# Patient Record
Sex: Female | Born: 1950 | Race: Black or African American | Hispanic: No | Marital: Married | Smoking: Never smoker
Health system: Southern US, Community
[De-identification: ages and names within clinical notes are randomized; demographics above are authoritative.]

## PROBLEM LIST (undated history)

## (undated) DIAGNOSIS — I10 Essential (primary) hypertension: Secondary | ICD-10-CM

## (undated) DIAGNOSIS — M109 Gout, unspecified: Secondary | ICD-10-CM

## (undated) DIAGNOSIS — Z8669 Personal history of other diseases of the nervous system and sense organs: Secondary | ICD-10-CM

## (undated) DIAGNOSIS — N950 Postmenopausal bleeding: Secondary | ICD-10-CM

## (undated) DIAGNOSIS — L280 Lichen simplex chronicus: Secondary | ICD-10-CM

## (undated) DIAGNOSIS — R7989 Other specified abnormal findings of blood chemistry: Secondary | ICD-10-CM

## (undated) DIAGNOSIS — N882 Stricture and stenosis of cervix uteri: Secondary | ICD-10-CM

## (undated) DIAGNOSIS — K219 Gastro-esophageal reflux disease without esophagitis: Secondary | ICD-10-CM

## (undated) DIAGNOSIS — M1990 Unspecified osteoarthritis, unspecified site: Secondary | ICD-10-CM

## (undated) DIAGNOSIS — J45909 Unspecified asthma, uncomplicated: Secondary | ICD-10-CM

## (undated) DIAGNOSIS — L680 Hirsutism: Secondary | ICD-10-CM

## (undated) DIAGNOSIS — E119 Type 2 diabetes mellitus without complications: Secondary | ICD-10-CM

## (undated) DIAGNOSIS — D259 Leiomyoma of uterus, unspecified: Secondary | ICD-10-CM

## (undated) DIAGNOSIS — Z973 Presence of spectacles and contact lenses: Secondary | ICD-10-CM

## (undated) DIAGNOSIS — M069 Rheumatoid arthritis, unspecified: Secondary | ICD-10-CM

## (undated) HISTORY — PX: COLONOSCOPY: SHX174

## (undated) HISTORY — PX: CARPAL TUNNEL RELEASE: SHX101

## (undated) HISTORY — DX: Essential (primary) hypertension: I10

---
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
## 1979-12-01 HISTORY — PX: TUBAL LIGATION: SHX77

## 1982-11-30 HISTORY — PX: CHOLECYSTECTOMY OPEN: SUR202

## 1983-12-01 HISTORY — PX: THROAT SURGERY: SHX803

## 1998-07-23 ENCOUNTER — Ambulatory Visit (HOSPITAL_COMMUNITY): Admission: RE | Admit: 1998-07-23 | Discharge: 1998-07-23 | Payer: Self-pay | Admitting: *Deleted

## 1998-11-19 ENCOUNTER — Ambulatory Visit (HOSPITAL_COMMUNITY): Admission: RE | Admit: 1998-11-19 | Discharge: 1998-11-19 | Payer: Self-pay

## 1999-03-05 ENCOUNTER — Ambulatory Visit (HOSPITAL_COMMUNITY): Admission: RE | Admit: 1999-03-05 | Discharge: 1999-03-05 | Payer: Self-pay | Admitting: *Deleted

## 1999-04-09 ENCOUNTER — Ambulatory Visit (HOSPITAL_COMMUNITY): Admission: RE | Admit: 1999-04-09 | Discharge: 1999-04-09 | Payer: Self-pay | Admitting: *Deleted

## 1999-05-20 ENCOUNTER — Ambulatory Visit (HOSPITAL_COMMUNITY): Admission: RE | Admit: 1999-05-20 | Discharge: 1999-05-20 | Payer: Self-pay | Admitting: *Deleted

## 1999-11-03 ENCOUNTER — Other Ambulatory Visit: Admission: RE | Admit: 1999-11-03 | Discharge: 1999-11-03 | Payer: Self-pay | Admitting: Obstetrics and Gynecology

## 2000-04-15 ENCOUNTER — Encounter: Payer: Self-pay | Admitting: *Deleted

## 2000-04-15 ENCOUNTER — Ambulatory Visit (HOSPITAL_COMMUNITY): Admission: RE | Admit: 2000-04-15 | Discharge: 2000-04-15 | Payer: Self-pay | Admitting: *Deleted

## 2001-07-27 ENCOUNTER — Ambulatory Visit (HOSPITAL_COMMUNITY): Admission: RE | Admit: 2001-07-27 | Discharge: 2001-07-27 | Payer: Self-pay | Admitting: *Deleted

## 2001-07-27 ENCOUNTER — Other Ambulatory Visit: Admission: RE | Admit: 2001-07-27 | Discharge: 2001-07-27 | Payer: Self-pay | Admitting: Obstetrics and Gynecology

## 2001-08-04 ENCOUNTER — Encounter: Admission: RE | Admit: 2001-08-04 | Discharge: 2001-08-04 | Payer: Self-pay | Admitting: *Deleted

## 2001-08-08 ENCOUNTER — Ambulatory Visit (HOSPITAL_COMMUNITY): Admission: RE | Admit: 2001-08-08 | Discharge: 2001-08-08 | Payer: Self-pay | Admitting: Obstetrics and Gynecology

## 2001-08-08 ENCOUNTER — Encounter: Payer: Self-pay | Admitting: Obstetrics and Gynecology

## 2004-01-15 ENCOUNTER — Other Ambulatory Visit: Admission: RE | Admit: 2004-01-15 | Discharge: 2004-01-15 | Payer: Self-pay | Admitting: Obstetrics and Gynecology

## 2004-01-15 ENCOUNTER — Ambulatory Visit (HOSPITAL_COMMUNITY): Admission: RE | Admit: 2004-01-15 | Discharge: 2004-01-15 | Payer: Self-pay | Admitting: Obstetrics and Gynecology

## 2005-07-05 ENCOUNTER — Emergency Department (HOSPITAL_COMMUNITY): Admission: EM | Admit: 2005-07-05 | Discharge: 2005-07-05 | Payer: Self-pay | Admitting: Emergency Medicine

## 2005-08-12 ENCOUNTER — Other Ambulatory Visit: Admission: RE | Admit: 2005-08-12 | Discharge: 2005-08-12 | Payer: Self-pay | Admitting: Obstetrics and Gynecology

## 2005-08-25 ENCOUNTER — Ambulatory Visit (HOSPITAL_COMMUNITY): Admission: RE | Admit: 2005-08-25 | Discharge: 2005-08-25 | Payer: Self-pay | Admitting: Obstetrics and Gynecology

## 2007-03-08 ENCOUNTER — Ambulatory Visit (HOSPITAL_COMMUNITY): Admission: RE | Admit: 2007-03-08 | Discharge: 2007-03-08 | Payer: Self-pay | Admitting: Internal Medicine

## 2007-04-12 ENCOUNTER — Encounter: Admission: RE | Admit: 2007-04-12 | Discharge: 2007-04-12 | Payer: Self-pay | Admitting: Neurosurgery

## 2007-12-05 ENCOUNTER — Encounter: Admission: RE | Admit: 2007-12-05 | Discharge: 2007-12-05 | Payer: Self-pay | Admitting: Internal Medicine

## 2008-12-18 ENCOUNTER — Emergency Department (HOSPITAL_COMMUNITY): Admission: EM | Admit: 2008-12-18 | Discharge: 2008-12-18 | Payer: Self-pay | Admitting: Emergency Medicine

## 2010-12-23 ENCOUNTER — Ambulatory Visit (HOSPITAL_COMMUNITY)
Admission: RE | Admit: 2010-12-23 | Discharge: 2010-12-23 | Payer: Self-pay | Source: Home / Self Care | Attending: Internal Medicine | Admitting: Internal Medicine

## 2011-01-19 ENCOUNTER — Emergency Department (INDEPENDENT_AMBULATORY_CARE_PROVIDER_SITE_OTHER): Payer: BC Managed Care – PPO

## 2011-01-19 ENCOUNTER — Emergency Department (HOSPITAL_BASED_OUTPATIENT_CLINIC_OR_DEPARTMENT_OTHER)
Admission: EM | Admit: 2011-01-19 | Discharge: 2011-01-19 | Disposition: A | Discharge status: Home / Self Care | Payer: BC Managed Care – PPO | Attending: Emergency Medicine | Admitting: Emergency Medicine

## 2011-01-19 DIAGNOSIS — Z79899 Other long term (current) drug therapy: Secondary | ICD-10-CM | POA: Insufficient documentation

## 2011-01-19 DIAGNOSIS — R079 Chest pain, unspecified: Secondary | ICD-10-CM

## 2011-01-19 DIAGNOSIS — R935 Abnormal findings on diagnostic imaging of other abdominal regions, including retroperitoneum: Secondary | ICD-10-CM

## 2011-01-19 DIAGNOSIS — M549 Dorsalgia, unspecified: Secondary | ICD-10-CM | POA: Insufficient documentation

## 2011-01-19 DIAGNOSIS — R599 Enlarged lymph nodes, unspecified: Secondary | ICD-10-CM | POA: Insufficient documentation

## 2011-01-19 DIAGNOSIS — I10 Essential (primary) hypertension: Secondary | ICD-10-CM | POA: Insufficient documentation

## 2011-01-19 DIAGNOSIS — R109 Unspecified abdominal pain: Secondary | ICD-10-CM

## 2011-01-19 DIAGNOSIS — E119 Type 2 diabetes mellitus without complications: Secondary | ICD-10-CM | POA: Insufficient documentation

## 2011-01-19 LAB — POCT CARDIAC MARKERS
CKMB, poc: 1.5 ng/mL (ref 1.0–8.0)
Myoglobin, poc: 89.6 ng/mL (ref 12–200)
Troponin i, poc: <0.05 ng/mL (ref 0.00–0.09)

## 2011-01-19 LAB — COMPREHENSIVE METABOLIC PANEL
AST: 23 U/L (ref 0–37)
Albumin: 4.6 g/dL (ref 3.5–5.2)
BUN: 13 mg/dL (ref 6–23)
Calcium: 10.6 mg/dL — ABNORMAL HIGH (ref 8.4–10.5)
Creatinine, Ser: 0.9 mg/dL (ref 0.4–1.2)
GFR calc Af Amer: >60 mL/min (ref >60)
GFR calc non Af Amer: >60 mL/min (ref >60)
Glucose, Bld: 64 mg/dL — ABNORMAL LOW (ref 70–99)
Potassium: 3.5 mEq/L (ref 3.5–5.1)
Total Bilirubin: 0.7 mg/dL (ref 0.3–1.2)

## 2011-01-19 LAB — LIPASE, BLOOD: Lipase: 63 U/L (ref 23–300)

## 2011-01-19 MED ORDER — IOHEXOL 300 MG/ML  SOLN: 100.0000 mL | Freq: Once | INTRAMUSCULAR | Status: DC | PRN

## 2011-01-20 LAB — POCT CARDIAC MARKERS
CKMB, poc: 1.9 ng/mL (ref 1.0–8.0)
Troponin i, poc: <0.05 ng/mL (ref 0.00–0.09)

## 2011-02-20 LAB — DIFFERENTIAL
Lymphocytes Relative: 35 % (ref 12–46)
Lymphs Abs: 5.3 10*3/uL — ABNORMAL HIGH (ref 0.7–4.0)
Monocytes Relative: 10 % (ref 3–12)
Neutro Abs: 7.9 10*3/uL — ABNORMAL HIGH (ref 1.7–7.7)

## 2011-02-20 LAB — CBC
HCT: 38.4 % (ref 36.0–46.0)
Hemoglobin: 12.6 g/dL (ref 12.0–15.0)
MCH: 29 pg (ref 26.0–34.0)
MCHC: 32.8 g/dL (ref 30.0–36.0)
MCV: 88.3 fL (ref 78.0–100.0)
RBC: 4.35 MIL/uL (ref 3.87–5.11)
RDW: 13.5 % (ref 11.5–15.5)
WBC: 15.2 10*3/uL — ABNORMAL HIGH (ref 4.0–10.5)

## 2011-03-16 LAB — GLUCOSE, CAPILLARY: Glucose-Capillary: 112 mg/dL — ABNORMAL HIGH (ref 70–99)

## 2012-01-15 ENCOUNTER — Other Ambulatory Visit (HOSPITAL_COMMUNITY): Payer: Self-pay | Admitting: Internal Medicine

## 2012-01-15 DIAGNOSIS — Z1231 Encounter for screening mammogram for malignant neoplasm of breast: Secondary | ICD-10-CM

## 2012-02-11 ENCOUNTER — Ambulatory Visit (HOSPITAL_COMMUNITY)
Admission: RE | Admit: 2012-02-11 | Discharge: 2012-02-11 | Disposition: A | Discharge status: Home / Self Care | Payer: BC Managed Care – PPO | Source: Ambulatory Visit | Attending: Internal Medicine | Admitting: Internal Medicine

## 2012-02-11 DIAGNOSIS — Z1231 Encounter for screening mammogram for malignant neoplasm of breast: Secondary | ICD-10-CM

## 2012-03-15 ENCOUNTER — Ambulatory Visit (INDEPENDENT_AMBULATORY_CARE_PROVIDER_SITE_OTHER): Payer: BC Managed Care – PPO | Admitting: Obstetrics and Gynecology

## 2012-03-15 ENCOUNTER — Encounter: Payer: Self-pay | Admitting: Obstetrics and Gynecology

## 2012-03-15 VITALS — BP 124/76 | HR 66 | Ht 64.0 in | Wt 210.0 lb

## 2012-03-15 DIAGNOSIS — E119 Type 2 diabetes mellitus without complications: Secondary | ICD-10-CM

## 2012-03-15 DIAGNOSIS — I10 Essential (primary) hypertension: Secondary | ICD-10-CM | POA: Insufficient documentation

## 2012-03-15 DIAGNOSIS — Z780 Asymptomatic menopausal state: Secondary | ICD-10-CM

## 2012-03-15 DIAGNOSIS — E669 Obesity, unspecified: Secondary | ICD-10-CM

## 2012-03-15 DIAGNOSIS — I152 Hypertension secondary to endocrine disorders: Secondary | ICD-10-CM | POA: Insufficient documentation

## 2012-03-15 DIAGNOSIS — N760 Acute vaginitis: Secondary | ICD-10-CM

## 2012-03-15 DIAGNOSIS — N951 Menopausal and female climacteric states: Secondary | ICD-10-CM

## 2012-03-15 DIAGNOSIS — B373 Candidiasis of vulva and vagina: Secondary | ICD-10-CM

## 2012-03-15 DIAGNOSIS — E1169 Type 2 diabetes mellitus with other specified complication: Secondary | ICD-10-CM | POA: Insufficient documentation

## 2012-03-15 DIAGNOSIS — E1159 Type 2 diabetes mellitus with other circulatory complications: Secondary | ICD-10-CM | POA: Insufficient documentation

## 2012-03-15 DIAGNOSIS — Z124 Encounter for screening for malignant neoplasm of cervix: Secondary | ICD-10-CM

## 2012-03-15 DIAGNOSIS — L280 Lichen simplex chronicus: Secondary | ICD-10-CM

## 2012-03-15 MED ORDER — TERCONAZOLE 0.4 % VA CREA: 1.0000 | TOPICAL_CREAM | Freq: Every day | VAGINAL | Status: AC

## 2012-03-15 MED ORDER — CLOBETASOL PROPIONATE 0.05 % EX OINT: TOPICAL_OINTMENT | Freq: Two times a day (BID) | CUTANEOUS | Status: AC

## 2012-03-15 NOTE — Progress Notes (Signed)
Pt c/o vulvar rash and itching that flares occasionally with discharge.   Subjective:    Nancy Wagner is a 61 y.o. female No obstetric history on file. who presents for annual exam. She complains of vulvar itching with discharge. She has a biopsy diagnosis of lichen simplex chronicus and has used topical steroids in the past.   The following portions of the patient's history were reviewed and updated as appropriate: allergies, current medications, past family history, past medical history, past social history, past surgical history and problem list.  Review of Systems Pertinent items are noted in HPI. Gastrointestinal:No change in bowel habits, no abdominal pain, no rectal bleeding Genitourinary:negative for dysuria, frequency, hematuria, nocturia and urinary incontinence    Objective:     BP 124/76  Pulse 66  Ht 5\' 4"  (1.626 m)  Wt 210 lb (95.255 kg)  BMI 36.05 kg/m2  Weight:  Wt Readings from Last 1 Encounters:  03/15/12 210 lb (95.255 kg)     BMI: Body mass index is 36.05 kg/(m^2). General Appearance: Alert, appropriate appearance for age. No acute distress HEENT: Grossly normal Neck / Thyroid: Supple, no masses, nodes or enlargement Lungs: clear to auscultation bilaterally Back: No CVA tenderness Breast Exam: No masses or nodes.No dimpling, nipple retraction or discharge. Cardiovascular: Regular rate and rhythm. S1, S2, no murmur Gastrointestinal: Soft, non-tender, no masses or organomegaly Pelvic Exam: External genitalia: excoriated with ulceration Vaginal: normal mucosa without prolapse or lesions Cervix: normal appearance Adnexa: non palpable Uterus: upper limit nl size Exam limited by body habitus Rectovaginal: normal rectal, no masses Lymphatic Exam: Non-palpable nodes in neck, clavicular, axillary, or inguinal regions Skin: no rash or abnormalities Neurologic: Normal gait and speech, no tremor  Psychiatric: Alert and oriented, appropriate affect.     Urinalysis:Not done      Assessment:    Monilia  Lichen simplex chronicus Type II diabetes hypertension   Plan:    All questions answered. Discussed healthy lifestyle modifications. Pap smear. Wet prep. Terazol  Temovate Temovate to external vulva  Follow-up:  in 6 week(s)

## 2012-03-16 ENCOUNTER — Other Ambulatory Visit: Payer: Self-pay | Admitting: Obstetrics and Gynecology

## 2012-03-16 ENCOUNTER — Telehealth: Payer: Self-pay | Admitting: Obstetrics and Gynecology

## 2012-03-16 LAB — PAP IG W/ RFLX HPV ASCU

## 2012-03-16 NOTE — Telephone Encounter (Signed)
PC TO PHARM PER TELEPHONE NOTE. RX FOR TERAZOL AND TEMOVATE CALLED TO PHARM. SPOKE WITH DAVID(PHARM). PT AWARE.

## 2012-03-16 NOTE — Telephone Encounter (Signed)
@  10:29A TC TO PHARM PER TELEPHONE CALL. TOLD BY PHARM TO DISREGARD REQUEST. CALL ENDED.

## 2012-03-16 NOTE — Telephone Encounter (Signed)
Routed to chandra 

## 2012-04-26 ENCOUNTER — Encounter: Payer: Self-pay | Admitting: Obstetrics and Gynecology

## 2012-04-26 ENCOUNTER — Ambulatory Visit (INDEPENDENT_AMBULATORY_CARE_PROVIDER_SITE_OTHER): Payer: BC Managed Care – PPO | Admitting: Obstetrics and Gynecology

## 2012-04-26 ENCOUNTER — Ambulatory Visit: Payer: BC Managed Care – PPO

## 2012-04-26 VITALS — BP 120/72 | Ht 64.0 in | Wt 212.0 lb

## 2012-04-26 DIAGNOSIS — Z780 Asymptomatic menopausal state: Secondary | ICD-10-CM

## 2012-04-26 DIAGNOSIS — N951 Menopausal and female climacteric states: Secondary | ICD-10-CM | POA: Insufficient documentation

## 2012-04-26 NOTE — Progress Notes (Signed)
Pt has no complaints today JM Presents for bone density which is normal  Rec:  Increase exercise  Continue calcium once daily

## 2013-08-21 ENCOUNTER — Other Ambulatory Visit: Payer: BC Managed Care – PPO | Admitting: Internal Medicine

## 2013-08-21 ENCOUNTER — Other Ambulatory Visit: Payer: Self-pay | Admitting: Internal Medicine

## 2013-08-21 DIAGNOSIS — Z0000 Encounter for general adult medical examination without abnormal findings: Secondary | ICD-10-CM

## 2013-08-21 DIAGNOSIS — Z13220 Encounter for screening for lipoid disorders: Secondary | ICD-10-CM

## 2013-08-21 DIAGNOSIS — Z130 Encounter for screening for diseases of the blood and blood-forming organs and certain disorders involving the immune mechanism: Secondary | ICD-10-CM

## 2013-08-21 DIAGNOSIS — Z1329 Encounter for screening for other suspected endocrine disorder: Secondary | ICD-10-CM

## 2013-08-21 LAB — COMPREHENSIVE METABOLIC PANEL
ALT: 13 U/L (ref 0–35)
AST: 13 U/L (ref 0–37)
Alkaline Phosphatase: 45 U/L (ref 39–117)
CO2: 30 mEq/L (ref 19–32)
Calcium: 9.6 mg/dL (ref 8.4–10.5)
Creat: 0.91 mg/dL (ref 0.50–1.10)
Potassium: 4.1 mEq/L (ref 3.5–5.3)
Sodium: 139 mEq/L (ref 135–145)
Total Bilirubin: 0.4 mg/dL (ref 0.3–1.2)

## 2013-08-21 LAB — CBC WITH DIFFERENTIAL/PLATELET
Eosinophils Absolute: 0.3 10*3/uL (ref 0.0–0.7)
HCT: 37 % (ref 36.0–46.0)
Hemoglobin: 12.2 g/dL (ref 12.0–15.0)
Lymphs Abs: 2.9 10*3/uL (ref 0.7–4.0)
MCH: 29.3 pg (ref 26.0–34.0)
Monocytes Absolute: 0.6 10*3/uL (ref 0.1–1.0)
Monocytes Relative: 8 % (ref 3–12)
Neutro Abs: 3.3 10*3/uL (ref 1.7–7.7)
Neutrophils Relative %: 47 % (ref 43–77)
RBC: 4.17 MIL/uL (ref 3.87–5.11)

## 2013-08-21 LAB — TSH: TSH: 1.572 u[IU]/mL (ref 0.350–4.500)

## 2013-08-21 LAB — LIPID PANEL
HDL: 58 mg/dL (ref >39)
LDL Cholesterol: 145 mg/dL — ABNORMAL HIGH (ref 0–99)
Total CHOL/HDL Ratio: 3.9 Ratio
VLDL: 24 mg/dL (ref 0–40)

## 2013-08-22 LAB — VITAMIN D 25 HYDROXY (VIT D DEFICIENCY, FRACTURES): Vit D, 25-Hydroxy: 20 ng/mL — ABNORMAL LOW (ref 30–89)

## 2013-08-24 ENCOUNTER — Encounter: Payer: Self-pay | Admitting: Internal Medicine

## 2013-08-24 ENCOUNTER — Ambulatory Visit (INDEPENDENT_AMBULATORY_CARE_PROVIDER_SITE_OTHER): Payer: BC Managed Care – PPO | Admitting: Internal Medicine

## 2013-08-24 VITALS — BP 150/78 | HR 68 | Temp 97.7°F | Ht 64.0 in | Wt 218.0 lb

## 2013-08-24 DIAGNOSIS — M1712 Unilateral primary osteoarthritis, left knee: Secondary | ICD-10-CM | POA: Insufficient documentation

## 2013-08-24 DIAGNOSIS — Z8639 Personal history of other endocrine, nutritional and metabolic disease: Secondary | ICD-10-CM

## 2013-08-24 DIAGNOSIS — E782 Mixed hyperlipidemia: Secondary | ICD-10-CM

## 2013-08-24 DIAGNOSIS — Z0000 Encounter for general adult medical examination without abnormal findings: Secondary | ICD-10-CM

## 2013-08-24 DIAGNOSIS — E669 Obesity, unspecified: Secondary | ICD-10-CM | POA: Insufficient documentation

## 2013-08-24 DIAGNOSIS — E559 Vitamin D deficiency, unspecified: Secondary | ICD-10-CM

## 2013-08-24 DIAGNOSIS — E119 Type 2 diabetes mellitus without complications: Secondary | ICD-10-CM

## 2013-08-24 DIAGNOSIS — M1710 Unilateral primary osteoarthritis, unspecified knee: Secondary | ICD-10-CM

## 2013-08-24 DIAGNOSIS — E8881 Metabolic syndrome: Secondary | ICD-10-CM

## 2013-08-24 DIAGNOSIS — I10 Essential (primary) hypertension: Secondary | ICD-10-CM

## 2013-08-24 LAB — POCT URINALYSIS DIPSTICK
Bilirubin, UA: NEGATIVE
Ketones, UA: NEGATIVE
Protein, UA: NEGATIVE
Spec Grav, UA: 1.025
pH, UA: 6

## 2013-08-24 NOTE — Patient Instructions (Addendum)
Taking vitamin D supplement weekly as directed then take 2000 units vitamin D 3 daily. Start Zocor 10 mg daily. Continue antihypertensive medication. Continue diabetic medication. Please take diet and exercise seriously. Return in 3 months.

## 2013-08-24 NOTE — Progress Notes (Signed)
Subjective:    Patient ID: Nancy Wagner, female    DOB: [birth year], 62 y.o.   MRN: [id number]  HPI 62 year old Black female presents to office for the first time today referred by Aliene Altes. She and Tomma Lightning worked together. 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  Patient has a history of type 2 diabetes mellitus, hypertension, obesity.  She takes repaglinide metformin daily, Tenormin 100 mg daily, Catapres 0.1 mg twice daily,  Micardis- HCTZ 80-25 daily.  After some research, we were able to find that she had colonoscopy by Dr. Virginia Rochester on  07/23/1998. This was for heme positive stools with tenesmus. Patient was found to have internal hemorrhoids but no polyps or masses.  Currently not on lipid-lowering medication.  Followed by Dr. Burgess Estelle, ophthalmologist for diabetic eye exam and last saw him February 2014. At one point she had an infection in her right eye and was seen at [facility] by Dr. Everlena Cooper. She was on prednisone for some period of time. Says she does not know what type of infection she had but says it was not shingles. We do not have those records.  Patient had pneumonia in 1977. Had throat surgery by Dr. Haroldine Laws in 1985. 2 C-sections 1975 and 1978. Had tubal ligation 1981. Had cholecystectomy in the early 1980s.  She is intolerant of codeine it causes rash and hives.  Patient does not smoke or consume alcohol. She has 2 adult children a son and a daughter in good health. She is married.  Family history: Father age 10 and mother age 70 in good health. 4 brothers living in good health. 4 sisters 3 of whom are in good health but one is living at age 19 with history of heart attack and other health issues.  Patient says she goes to the gym twice a week.  In 2012 Dr. Lajean Silvius injected her left knee with steroid for osteoarthritis  It seems she had labile hypertension when she was previously seen by Dr. Nicholos Johns.  I cannot find  date of recent tetanus immunization in his old records. She cannot tell me date of last tetanus immunization either.  All records indicate she used to take Zocor but that was discontinued.    Review of Systems  Constitutional: Negative.   HENT: Negative.   Eyes:       History of right eye problems treated by Dr. Everlena Cooper  Respiratory: Negative.   Cardiovascular: Negative.   Gastrointestinal: Negative.   Endocrine:       History of diabetes mellitus controlled with medication  Allergic/Immunologic: Negative.   Neurological: Negative.   Hematological: Negative.   Psychiatric/Behavioral: Negative.        Objective:   Physical Exam  Vitals reviewed. Constitutional: She is oriented to person, place, and time. She appears well-developed and well-nourished. No distress.  HENT:  Head: Normocephalic and atraumatic.  Right Ear: External ear normal.  Left Ear: External ear normal.  Mouth/Throat: Oropharynx is clear and moist. No oropharyngeal exudate.  Eyes: Conjunctivae and EOM are normal. Pupils are equal, round, and reactive to light. Right eye exhibits no discharge. Left eye exhibits no discharge. No scleral icterus.  Neck: Neck supple. No JVD present. No thyromegaly present.  Cardiovascular: Normal rate, regular rhythm, normal heart sounds and intact distal pulses.   No murmur heard. Pulmonary/Chest: Effort normal and breath sounds normal. No respiratory distress. She has no wheezes. She has no rales. She exhibits no tenderness.  Breasts normal female without masses  Abdominal: Soft. Bowel sounds are normal. She exhibits no distension and no mass. There is no tenderness. There is no rebound and no guarding.  Genitourinary:  Deferred to Dr. Pennie Rushing  Musculoskeletal: She exhibits no edema.  Neurological: She is alert and oriented to person, place, and time. She has normal reflexes. She displays normal reflexes. No cranial nerve deficit. Coordination normal.  Skin: Skin is warm and  dry. No rash noted. She is not diaphoretic.  Psychiatric: She has a normal mood and affect. Her behavior is normal. Judgment and thought content normal.          Assessment & Plan:  Obesity-spoke with patient about weight loss, diet ,and exercise  Type 2 diabetes mellitus-controlled with diet  Hypertension-treated with myocarditis HCTZ and Tenormin  Vitamin D deficiency  Osteoarthritis left knee-has had steroid injection by Dr. Dierdre Forth 2012  Plan: Start Zocor 10 mg daily, needs to take vitamin D supplementation 50,000 units weekly for 12 weeks then2000 units daily, continue same medications for hypertension and diabetes. Patient should return in 3 months for office visit, lipid panel ,liver functions, and hemoglobin A 1C. Needs to be encouraged diet exercise and lose weight. Patient refuses all immunizations.

## 2013-09-05 ENCOUNTER — Other Ambulatory Visit: Payer: Self-pay | Admitting: Internal Medicine

## 2013-09-05 DIAGNOSIS — Z1231 Encounter for screening mammogram for malignant neoplasm of breast: Secondary | ICD-10-CM

## 2013-09-15 ENCOUNTER — Ambulatory Visit (HOSPITAL_COMMUNITY)
Admission: RE | Admit: 2013-09-15 | Discharge: 2013-09-15 | Disposition: A | Discharge status: Home / Self Care | Payer: BC Managed Care – PPO | Source: Ambulatory Visit | Attending: Internal Medicine | Admitting: Internal Medicine

## 2013-09-15 DIAGNOSIS — Z1231 Encounter for screening mammogram for malignant neoplasm of breast: Secondary | ICD-10-CM | POA: Insufficient documentation

## 2013-10-15 ENCOUNTER — Telehealth: Payer: Self-pay | Admitting: Internal Medicine

## 2013-10-15 MED ORDER — TELMISARTAN-HCTZ 80-25 MG PO TABS: 1.0000 | ORAL_TABLET | Freq: Every day | ORAL | Status: DC

## 2013-10-15 NOTE — Telephone Encounter (Signed)
Refill Micardis 80/25 #30 with 6 refills to [facility] fax [phone]

## 2013-12-04 ENCOUNTER — Other Ambulatory Visit: Payer: BC Managed Care – PPO | Admitting: Internal Medicine

## 2013-12-04 DIAGNOSIS — E559 Vitamin D deficiency, unspecified: Secondary | ICD-10-CM

## 2013-12-04 DIAGNOSIS — E119 Type 2 diabetes mellitus without complications: Secondary | ICD-10-CM

## 2013-12-04 DIAGNOSIS — E785 Hyperlipidemia, unspecified: Secondary | ICD-10-CM

## 2013-12-04 DIAGNOSIS — Z79899 Other long term (current) drug therapy: Secondary | ICD-10-CM

## 2013-12-04 LAB — LIPID PANEL
Cholesterol: 236 mg/dL — ABNORMAL HIGH (ref 0–200)
HDL: 61 mg/dL (ref >39)
LDL Cholesterol: 147 mg/dL — ABNORMAL HIGH (ref 0–99)
TRIGLYCERIDES: 142 mg/dL (ref <150)
Total CHOL/HDL Ratio: 3.9 Ratio
VLDL: 28 mg/dL (ref 0–40)

## 2013-12-04 LAB — HEPATIC FUNCTION PANEL
ALT: 13 U/L (ref 0–35)
AST: 13 U/L (ref 0–37)
Albumin: 3.8 g/dL (ref 3.5–5.2)
Alkaline Phosphatase: 47 U/L (ref 39–117)
BILIRUBIN DIRECT: 0.1 mg/dL (ref 0.0–0.3)
Indirect Bilirubin: 0.2 mg/dL (ref 0.0–0.9)
Total Bilirubin: 0.3 mg/dL (ref 0.3–1.2)
Total Protein: 8.2 g/dL (ref 6.0–8.3)

## 2013-12-04 LAB — HEMOGLOBIN A1C
Hgb A1c MFr Bld: 7.2 % — ABNORMAL HIGH (ref <5.7)
Mean Plasma Glucose: 160 mg/dL — ABNORMAL HIGH (ref <117)

## 2013-12-05 ENCOUNTER — Encounter: Payer: Self-pay | Admitting: Internal Medicine

## 2013-12-05 ENCOUNTER — Ambulatory Visit (INDEPENDENT_AMBULATORY_CARE_PROVIDER_SITE_OTHER): Payer: BC Managed Care – PPO | Admitting: Internal Medicine

## 2013-12-05 VITALS — BP 144/78 | HR 78 | Temp 98.2°F | Wt 210.0 lb

## 2013-12-05 DIAGNOSIS — E8881 Metabolic syndrome: Secondary | ICD-10-CM

## 2013-12-05 DIAGNOSIS — E785 Hyperlipidemia, unspecified: Secondary | ICD-10-CM

## 2013-12-05 DIAGNOSIS — I10 Essential (primary) hypertension: Secondary | ICD-10-CM

## 2013-12-05 DIAGNOSIS — E119 Type 2 diabetes mellitus without complications: Secondary | ICD-10-CM

## 2013-12-05 DIAGNOSIS — E669 Obesity, unspecified: Secondary | ICD-10-CM

## 2013-12-05 LAB — VITAMIN D 25 HYDROXY (VIT D DEFICIENCY, FRACTURES): Vit D, 25-Hydroxy: 24 ng/mL — ABNORMAL LOW (ref 30–89)

## 2013-12-05 NOTE — Progress Notes (Signed)
   Subjective:    Patient ID: Nancy Wagner, female    DOB: [birth year], 63 y.o.   MRN: [id number]  HPI  In today for followup on diabetes mellitus, hypertension, hyperlipidemia, metabolic syndrome, vitamin D deficiency. She is not compliant with Zocor. She has been going to gym on a regular basis and has lost 8 pounds since initial visit which is good. Hemoglobin A1c as the same at 7.2%. Blood pressure is stable. Vitamin D has not changed very much despite taking 50,000 units weekly for 12 weeks followed by thousand units daily. She should increase to 2000 units daily vitamin D 3. I'm disappointed she has not been compliant with Zocor. Has not taken it for 2 weeks and lipid panel reflects that. Of course liver functions are normal.    Review of Systems     Objective:   Physical Exam Chest clear. Cardiac exam regular rate and rhythm normal S1-S2. Extremities without edema       Assessment & Plan:  For followup of multiple medical issues: Compliance is an issue  Hypertension  Adult onset diabetes mellitus  Hyperlipidemia  Vitamin D deficiency    It is good that she has begun exercising regularly at the gym. She is on a pill weight loss. Reviewed with her once again 1800-calorie diet. She needs to purchase a new home glucose monitor. Says Accu-Cheks are running no higher than 111 at home but according to A1c glucose is averaging 160. She only checks it in the mornings. Not checking it before supper. I will her to check Accu-Cheks before breakfast and before supper and call me with some readings in the next 2 weeks. We need to determine if she needs medication or not. She does take Zocor 10 mg daily on a regular basis. I would see her again in 3 months for followup on hyperlipidemia and diabetic management. She will have lipid panel liver functions hemoglobin A1c in vitamin D level. She should take 2000 units vitamin D 3 daily

## 2013-12-05 NOTE — Patient Instructions (Signed)
Continue diet and exercise. Follow 1800-calorie ADA diet. Return in 3 months. Take 2000 units vitamin D 3 daily. Takes Zocor on a daily basis. Obtain Accu-Cheks before breakfast and before supper and call in 2 weeks with readings. Otherwise return in 3 months

## 2014-02-27 ENCOUNTER — Other Ambulatory Visit: Payer: BC Managed Care – PPO | Admitting: Internal Medicine

## 2014-03-01 ENCOUNTER — Ambulatory Visit: Payer: BC Managed Care – PPO | Admitting: Internal Medicine

## 2014-03-06 ENCOUNTER — Other Ambulatory Visit: Payer: BC Managed Care – PPO | Admitting: Internal Medicine

## 2014-03-06 DIAGNOSIS — Z79899 Other long term (current) drug therapy: Secondary | ICD-10-CM

## 2014-03-06 DIAGNOSIS — E559 Vitamin D deficiency, unspecified: Secondary | ICD-10-CM

## 2014-03-06 DIAGNOSIS — E119 Type 2 diabetes mellitus without complications: Secondary | ICD-10-CM

## 2014-03-06 DIAGNOSIS — E785 Hyperlipidemia, unspecified: Secondary | ICD-10-CM

## 2014-03-06 LAB — HEPATIC FUNCTION PANEL
ALT: 15 U/L (ref 0–35)
AST: 13 U/L (ref 0–37)
Albumin: 3.5 g/dL (ref 3.5–5.2)
Alkaline Phosphatase: 52 U/L (ref 39–117)
BILIRUBIN DIRECT: 0.1 mg/dL (ref 0.0–0.3)
BILIRUBIN INDIRECT: 0.4 mg/dL (ref 0.2–1.2)
TOTAL PROTEIN: 7.9 g/dL (ref 6.0–8.3)
Total Bilirubin: 0.5 mg/dL (ref 0.2–1.2)

## 2014-03-06 LAB — HEMOGLOBIN A1C
HEMOGLOBIN A1C: 7 % — AB (ref <5.7)
Mean Plasma Glucose: 154 mg/dL — ABNORMAL HIGH (ref <117)

## 2014-03-06 LAB — LIPID PANEL
Cholesterol: 192 mg/dL (ref 0–200)
HDL: 44 mg/dL (ref >39)
LDL CALC: 134 mg/dL — AB (ref 0–99)
Total CHOL/HDL Ratio: 4.4 Ratio
Triglycerides: 71 mg/dL (ref <150)
VLDL: 14 mg/dL (ref 0–40)

## 2014-03-07 ENCOUNTER — Other Ambulatory Visit: Payer: Self-pay

## 2014-03-07 LAB — VITAMIN D 25 HYDROXY (VIT D DEFICIENCY, FRACTURES): Vit D, 25-Hydroxy: 37 ng/mL (ref 30–89)

## 2014-03-07 MED ORDER — REPAGLINIDE-METFORMIN HCL 2-500 MG PO TABS: 1.0000 | ORAL_TABLET | Freq: Three times a day (TID) | ORAL | Status: DC

## 2014-03-08 ENCOUNTER — Ambulatory Visit (INDEPENDENT_AMBULATORY_CARE_PROVIDER_SITE_OTHER): Payer: BC Managed Care – PPO | Admitting: Internal Medicine

## 2014-03-08 ENCOUNTER — Encounter: Payer: Self-pay | Admitting: Internal Medicine

## 2014-03-08 VITALS — BP 154/80 | HR 76 | Temp 98.1°F | Ht 63.0 in | Wt 205.0 lb

## 2014-03-08 DIAGNOSIS — J029 Acute pharyngitis, unspecified: Secondary | ICD-10-CM

## 2014-03-08 DIAGNOSIS — B9789 Other viral agents as the cause of diseases classified elsewhere: Secondary | ICD-10-CM

## 2014-03-08 DIAGNOSIS — E119 Type 2 diabetes mellitus without complications: Secondary | ICD-10-CM

## 2014-03-08 DIAGNOSIS — B349 Viral infection, unspecified: Secondary | ICD-10-CM

## 2014-03-08 DIAGNOSIS — I10 Essential (primary) hypertension: Secondary | ICD-10-CM

## 2014-03-08 DIAGNOSIS — E785 Hyperlipidemia, unspecified: Secondary | ICD-10-CM

## 2014-03-08 LAB — POCT RAPID STREP A (OFFICE): RAPID STREP A SCREEN: NEGATIVE

## 2014-03-08 MED ORDER — BENZONATATE 200 MG PO CAPS: 200.0000 mg | ORAL_CAPSULE | Freq: Three times a day (TID) | ORAL | Status: DC | PRN

## 2014-03-08 MED ORDER — LEVOFLOXACIN 500 MG PO TABS: 500.0000 mg | ORAL_TABLET | Freq: Every day | ORAL | Status: DC

## 2014-03-08 MED ORDER — SIMVASTATIN 20 MG PO TABS: 20.0000 mg | ORAL_TABLET | Freq: Once | ORAL | Status: DC

## 2014-03-08 NOTE — Patient Instructions (Signed)
Take Levaquin 500 milligrams daily for 7 days. Increase Zocor to 20 mg daily. Take blood pressure medication always before coming to the office. Continue same diabetic medication. Return in 4 months.

## 2014-03-19 ENCOUNTER — Other Ambulatory Visit: Payer: Self-pay | Admitting: Internal Medicine

## 2014-03-31 ENCOUNTER — Encounter: Payer: Self-pay | Admitting: Internal Medicine

## 2014-03-31 NOTE — Progress Notes (Signed)
   Subjective:    Patient ID: Caroline More, female    DOB: [birth year], 63 y.o.   MRN: [id number]  HPI In today to followup on hypertension and type 2 diabetes mellitus. History also of hyperlipidemia and obesity with metabolic syndrome. Patient has not had blood pressure medication this morning. 4 day history of URI symptoms. She is on Zocor and Tenormin as well as myocarditis HCTZ. She is on Catapres. For diabetes she is on Prandimet. At last visit in January she was noncompliant with Zocor. Has lost 4-1/2 pounds since January. Total cholesterol has decreased from 236 to 192. LDL cholesterol has decreased from 147 to 134. Has had malaise and fatigue due to respiratory infection with some cough and congestion. Slight sore throat.    Review of Systems     Objective:   Physical Exam neck supple without JVD thyromegaly adenopathy. Chest clear. Cardiac exam regular rate and rhythm. Extremities without edema.pharynx slightly injected. Rapid strep screen negative         Assessment & Plan:   Acute URI  Hypertension-did not take blood pressure medication before coming to office. Reminded to do so  Controlled type 2 diabetes mellitus-hemoglobin A1c has improved from 7.2% to 7%  Hyperlipidemia-better being compliant with Zocor but I would like to see LDL less than 100. Increase Zocor to 20 mg daily and return in 4 months  Plan: Return in 4 months for office visit blood pressure check lipid panel liver functions on increased dose of Zocor.

## 2014-04-24 ENCOUNTER — Encounter (HOSPITAL_COMMUNITY): Payer: Self-pay | Admitting: Emergency Medicine

## 2014-04-24 ENCOUNTER — Emergency Department (HOSPITAL_COMMUNITY)
Admission: EM | Admit: 2014-04-24 | Discharge: 2014-04-24 | Disposition: A | Discharge status: Home / Self Care | Payer: BC Managed Care – PPO | Source: Home / Self Care

## 2014-04-24 DIAGNOSIS — S335XXA Sprain of ligaments of lumbar spine, initial encounter: Secondary | ICD-10-CM

## 2014-04-24 DIAGNOSIS — IMO0002 Reserved for concepts with insufficient information to code with codable children: Secondary | ICD-10-CM

## 2014-04-24 DIAGNOSIS — S39012A Strain of muscle, fascia and tendon of lower back, initial encounter: Secondary | ICD-10-CM

## 2014-04-24 DIAGNOSIS — X58XXXA Exposure to other specified factors, initial encounter: Secondary | ICD-10-CM

## 2014-04-24 DIAGNOSIS — M25559 Pain in unspecified hip: Secondary | ICD-10-CM

## 2014-04-24 DIAGNOSIS — S76919A Strain of unspecified muscles, fascia and tendons at thigh level, unspecified thigh, initial encounter: Secondary | ICD-10-CM

## 2014-04-24 DIAGNOSIS — M25551 Pain in right hip: Secondary | ICD-10-CM

## 2014-04-24 DIAGNOSIS — S76011A Strain of muscle, fascia and tendon of right hip, initial encounter: Secondary | ICD-10-CM

## 2014-04-24 MED ORDER — MELOXICAM 15 MG PO TABS: 15.0000 mg | ORAL_TABLET | Freq: Every day | ORAL | Status: DC

## 2014-04-24 MED ORDER — KETOROLAC TROMETHAMINE 60 MG/2ML IM SOLN
60.0000 mg | Freq: Once | INTRAMUSCULAR | Status: AC
Start: 2014-04-24 — End: 2014-04-24
  Administered 2014-04-24: 60 mg via INTRAMUSCULAR

## 2014-04-24 MED ORDER — KETOROLAC TROMETHAMINE 60 MG/2ML IM SOLN
INTRAMUSCULAR | Status: AC
  Filled 2014-04-24: qty 2

## 2014-04-24 MED ORDER — HYDROCODONE-ACETAMINOPHEN 5-325 MG PO TABS: 1.0000 | ORAL_TABLET | ORAL | Status: DC | PRN

## 2014-04-24 NOTE — ED Notes (Signed)
Hip and leg pain, no known injury.  Patient has been trying to exercise and doing different exercises, unsure if related

## 2014-04-24 NOTE — Discharge Instructions (Signed)
Back Pain, Adult Low back pain is very common. About 1 in 5 people have back pain.The cause of low back pain is rarely dangerous. The pain often gets better over time.About half of people with a sudden onset of back pain feel better in just 2 weeks. About 8 in 10 people feel better by 6 weeks.  CAUSES Some common causes of back pain include:  Strain of the muscles or ligaments supporting the spine.  Wear and tear (degeneration) of the spinal discs.  Arthritis.  Direct injury to the back. DIAGNOSIS Most of the time, the direct cause of low back pain is not known.However, back pain can be treated effectively even when the exact cause of the pain is unknown.Answering your caregiver's questions about your overall health and symptoms is one of the most accurate ways to make sure the cause of your pain is not dangerous. If your caregiver needs more information, he or she may order lab work or imaging tests (X-rays or MRIs).However, even if imaging tests show changes in your back, this usually does not require surgery. HOME CARE INSTRUCTIONS For many people, back pain returns.Since low back pain is rarely dangerous, it is often a condition that people can learn to [facility] their own.   Remain active. It is stressful on the back to sit or stand in one place. Do not sit, drive, or stand in one place for more than 30 minutes at a time. Take short walks on level surfaces as soon as pain allows.Try to increase the length of time you walk each day.  Do not stay in bed.Resting more than 1 or 2 days can delay your recovery.  Do not avoid exercise or work.Your body is made to move.It is not dangerous to be active, even though your back may hurt.Your back will likely heal faster if you return to being active before your pain is gone.  Pay attention to your body when you bend and lift. Many people have less discomfortwhen lifting if they bend their knees, keep the load close to their bodies,and  avoid twisting. Often, the most comfortable positions are those that put less stress on your recovering back.  Find a comfortable position to sleep. Use a firm mattress and lie on your side with your knees slightly bent. If you lie on your back, put a pillow under your knees.  Only take over-the-counter or prescription medicines as directed by your caregiver. Over-the-counter medicines to reduce pain and inflammation are often the most helpful.Your caregiver may prescribe muscle relaxant drugs.These medicines help dull your pain so you can more quickly return to your normal activities and healthy exercise.  Put ice on the injured area.  Put ice in a plastic bag.  Place a towel between your skin and the bag.  Leave the ice on for 15-20 minutes, 03-04 times a day for the first 2 to 3 days. After that, ice and heat may be alternated to reduce pain and spasms.  Ask your caregiver about trying back exercises and gentle massage. This may be of some benefit.  Avoid feeling anxious or stressed.Stress increases muscle tension and can worsen back pain.It is important to recognize when you are anxious or stressed and learn ways to manage it.Exercise is a great option. SEEK MEDICAL CARE IF:  You have pain that is not relieved with rest or medicine.  You have pain that does not improve in 1 week.  You have new symptoms.  You are generally not feeling well. SEEK  IMMEDIATE MEDICAL CARE IF:   You have pain that radiates from your back into your legs.  You develop new bowel or bladder control problems.  You have unusual weakness or numbness in your arms or legs.  You develop nausea or vomiting.  You develop abdominal pain.  You feel faint. Document Released: 11/16/2005 Document Revised: 05/17/2012 Document Reviewed: 04/06/2011 [facility] Patient Information 2014 [place], [place].  Hip Pain The hips join the upper legs to the lower pelvis. The bones, cartilage, tendons, and muscles of the  hip joint perform a lot of work each day holding your body weight and allowing you to move around. Hip pain is a common symptom. It can range from a minor ache to severe pain on 1 or both hips. Pain may be felt on the inside of the hip joint near the groin, or the outside near the buttocks and upper thigh. There may be swelling or stiffness as well. It occurs more often when a person walks or performs activity. There are many reasons hip pain can develop. CAUSES  It is important to work with your caregiver to identify the cause since many conditions can impact the bones, cartilage, muscles, and tendons of the hips. Causes for hip pain include:  Broken (fractured) bones.  Separation of the thighbone from the hip socket (dislocation).  Torn cartilage of the hip joint.  Swelling (inflammation) of a tendon (tendonitis), the sac within the hip joint (bursitis), or a joint.  A weakening in the abdominal wall (hernia), affecting the nerves to the hip.  Arthritis in the hip joint or lining of the hip joint.  Pinched nerves in the back, hip, or upper thigh.  A bulging disc in the spine (herniated disc).  Rarely, bone infection or cancer. DIAGNOSIS  The location of your hip pain will help your caregiver understand what may be causing the pain. A diagnosis is based on your medical history, your symptoms, results from your physical exam, and results from diagnostic tests. Diagnostic tests may include X-ray exams, a computerized magnetic scan (magnetic resonance imaging, MRI), or bone scan. TREATMENT  Treatment will depend on the cause of your hip pain. Treatment may include:  Limiting activities and resting until symptoms improve.  Crutches or other walking supports (a cane or brace).  Ice, elevation, and compression.  Physical therapy or home exercises.  Shoe inserts or special shoes.  Losing weight.  Medications to reduce pain.  Undergoing surgery. HOME CARE INSTRUCTIONS   Only take  over-the-counter or prescription medicines for pain, discomfort, or fever as directed by your caregiver.  Put ice on the injured area:  Put ice in a plastic bag.  Place a towel between your skin and the bag.  Leave the ice on for 15-20 minutes at a time, 03-04 times a day.  Keep your leg raised (elevated) when possible to lessen swelling.  Avoid activities that cause pain.  Follow specific exercises as directed by your caregiver.  Sleep with a pillow between your legs on your most comfortable side.  Record how often you have hip pain, the location of the pain, and what it feels like. This information may be helpful to you and your caregiver.  Ask your caregiver about returning to work or sports and whether you should drive.  Follow up with your caregiver for further exams, therapy, or testing as directed. SEEK MEDICAL CARE IF:   Your pain or swelling continues or worsens after 1 week.  You are feeling unwell or have chills.  You have increasing difficulty with walking.  You have a loss of sensation or other new symptoms.  You have questions or concerns. SEEK IMMEDIATE MEDICAL CARE IF:   You cannot put weight on the affected hip.  You have fallen.  You have a sudden increase in pain and swelling in your hip.  You have a fever. MAKE SURE YOU:   Understand these instructions.  Will watch your condition.  Will get help right away if you are not doing well or get worse. Document Released: 05/06/2010 Document Revised: 02/08/2012 Document Reviewed: 05/06/2010 [facility] Patient Information 2014 [place].  Heat Therapy Heat therapy can help ease achy, tense, stiff, and tight muscles and joints. Heat should not be used on new injuries. Wait at least 48 hours after the injury before using heat therapy. Heat also should not be used for discomfort or pain that occurs right after doing an activity. If you still have pain or stiffness 3 hours after finishing the activity,  then heat therapy may be used. PRECAUTIONS  High heat or prolonged exposure to heat can cause burns. Be careful when using heat therapy to avoid burning your skin. If you have any of the following conditions, do not use heat until you have discussed heat therapy with your caregiver:  Poor circulation.  Healing wounds or scarred skin in the area being treated.  Diabetes, heart disease, or high blood pressure.  Numbness of the area being treated.  Unusual swelling of the area being treated.  Active infections.  Blood clots.  Cancer.  Inability to communicate your response to pain. This can include young children and people with dementia. HOME CARE INSTRUCTIONS Moist heat pack  Soak a clean towel in warm water, and squeeze out the extra water. The water temperature should be comfortable to the skin.  Put the warm, wet towel in a plastic bag.  Place a thin, dry towel between your skin and the bag.  Put the heat pack on the area for 5 minutes, and check your skin. Your skin may be pink, but it should not be red.  Leave the heat pack on the area for a total of 15 to 30 minutes.  Repeat this every 2 to 4 hours while awake. Do not use heat while you are sleeping. Warm water bath  Fill a tub with warm water. The water temperature should be comfortable to the skin.  Place the affected body part in the tub.  Soak the area for 20 to 40 minutes.  Repeat as needed. Hot water bottle  Fill the water bottle half full with hot water.  Press out the extra air. Close the cap tightly.  Place a dry towel between your skin and the bottle.  Put the bottle on the area for 5 minutes, and check your skin. Your skin may be pink, but it should not be red.  Leave the bottle on the area for a total of 15 to 30 minutes.  Repeat this every 2 to 4 hours while awake. Electric heating pad  Place a dry towel between your skin and the heating pad.  Set the heating pad on low heat.  Put the  heating pad on the area for 10 minutes, and check your skin. Your skin may be pink, but it should not be red.  Leave the heating pad on the area for a total of 20 to 40 minutes.  Repeat this every 2 to 4 hours while awake.  Do not lie on the  heating pad.  Do not fall asleep while using the heating pad.  Do not use the heating pad near water. Contact with water can result in an electrical shock. SEEK MEDICAL CARE IF:  You have blisters, redness, swelling, or numbness.  You have any new problems.  Your problems are getting worse.  You have any questions or concerns. If you develop any problems, stop using heat therapy until you see your caregiver. MAKE SURE YOU:  Understand these instructions.  Will watch your condition.  Will get help right away if you are not doing well or get worse. Document Released: 02/08/2012 Document Reviewed: 02/08/2012 [facility] Patient Information 2014 [place].  Muscle Strain A muscle strain is an injury that occurs when a muscle is stretched beyond its normal length. Usually a small number of muscle fibers are torn when this happens. Muscle strain is rated in degrees. First-degree strains have the least amount of muscle fiber tearing and pain. Second-degree and third-degree strains have increasingly more tearing and pain.  Usually, recovery from muscle strain takes 1 2 weeks. Complete healing takes 5 6 weeks.  CAUSES  Muscle strain happens when a sudden, violent force placed on a muscle stretches it too far. This may occur with lifting, sports, or a fall.  RISK FACTORS Muscle strain is especially common in athletes.  SIGNS AND SYMPTOMS At the site of the muscle strain, there may be:  Pain.  Bruising.  Swelling.  Difficulty using the muscle due to pain or lack of normal function. DIAGNOSIS  Your health care provider will perform a physical exam and ask about your medical history. TREATMENT  Often, the best treatment for a muscle  strain is resting, icing, and applying cold compresses to the injured area.  HOME CARE INSTRUCTIONS   Use the PRICE method of treatment to promote muscle healing during the first 2 3 days after your injury. The PRICE method involves:  Protecting the muscle from being injured again.  Restricting your activity and resting the injured body part.  Icing your injury. To do this, put ice in a plastic bag. Place a towel between your skin and the bag. Then, apply the ice and leave it on from 15 20 minutes each hour. After the third day, switch to moist heat packs.  Apply compression to the injured area with a splint or elastic bandage. Be careful not to wrap it too tightly. This may interfere with blood circulation or increase swelling.  Elevate the injured body part above the level of your heart as often as you can.  Only take over-the-counter or prescription medicines for pain, discomfort, or fever as directed by your health care provider.  Warming up prior to exercise helps to prevent future muscle strains. SEEK MEDICAL CARE IF:   You have increasing pain or swelling in the injured area.  You have numbness, tingling, or a significant loss of strength in the injured area. MAKE SURE YOU:   Understand these instructions.  Will watch your condition.  Will get help right away if you are not doing well or get worse. Document Released: 11/16/2005 Document Revised: 09/06/2013 Document Reviewed: 06/15/2013 [facility] Patient Information 2014 [place], [place].

## 2014-04-24 NOTE — ED Provider Notes (Signed)
CSN: [id number]     Arrival date & time 04/24/14  1124 History   First MD Initiated Contact with Patient 04/24/14 1306     Chief Complaint  Patient presents with  . Hip Pain  . Leg Pain   (Consider location/radiation/quality/duration/timing/severity/associated sxs/prior Treatment) HPI Comments: 63 Y O F with R hip and leg pain for 6 weeks, worse in past 3 d. Has been exercising and trying various movements that helped initially but seem to worsen things later. Difficulty with ambulation due to pain.No fall, trauma or other known injury   Past Medical History  Diagnosis Date  . Hypertension    Past Surgical History  Procedure Laterality Date  . Cholecystectomy  1984  . Throat surgery  1986  . Tubal ligation  1985  . Cesarean section  1975, 1978   No family history on file. History  Substance Use Topics  . Smoking status: Never Smoker   . Smokeless tobacco: Not on file  . Alcohol Use: No   OB History   Grav Para Term Preterm Abortions TAB SAB Ect Mult Living   2 2 2  0 0 0 0 0 0 2     Review of Systems  Constitutional: Positive for activity change. Negative for fever and chills.  HENT: Negative.   Respiratory: Negative.   Cardiovascular: Negative.   Musculoskeletal: Positive for back pain, gait problem and myalgias. Negative for neck pain and neck stiffness.       As per HPI  Skin: Negative for color change, pallor and rash.  Neurological: Negative.        No focal weakness or paresthesias    Allergies  Codeine  Home Medications   Prior to Admission medications   Medication Sig Start Date End Date Taking? Authorizing Provider  atenolol (TENORMIN) 25 MG tablet Take 25 mg by mouth daily.    Historical Provider, MD  b complex vitamins tablet Take 1 tablet by mouth daily.    Historical Provider, MD  benzonatate (TESSALON) 200 MG capsule Take 1 capsule (200 mg total) by mouth 3 (three) times daily as needed for cough. 03/08/14   Elby Showers, MD  calcium-vitamin D  (OSCAL WITH D) 500-200 MG-UNIT per tablet Take 1 tablet by mouth daily.    Historical Provider, MD  cetirizine (ZYRTEC) 10 MG tablet Take 10 mg by mouth as needed.    Historical Provider, MD  cloNIDine (CATAPRES) 0.2 MG tablet Take 0.2 mg by mouth 2 (two) times daily.    Historical Provider, MD  fish oil-omega-3 fatty acids 1000 MG capsule Take 2 g by mouth daily.    Historical Provider, MD  folic acid (FOLVITE) 1 MG tablet Take 1 mg by mouth daily.    Historical Provider, MD  levofloxacin (LEVAQUIN) 500 MG tablet Take 1 tablet (500 mg total) by mouth daily. 03/08/14   Elby Showers, MD  loratadine-pseudoephedrine (CLARITIN-D 12-HOUR) 5-120 MG per tablet Take 1 tablet by mouth 2 (two) times daily.    Historical Provider, MD  repaglinide-metformin (PRANDIMET) 2-500 MG tablet Take 1 tablet by mouth 1 day or 1 dose. 03/07/14   Elby Showers, MD  simvastatin (ZOCOR) 10 MG tablet TAKE ONE TABLET BY MOUTH ONE TIME DAILY  03/19/14   Elby Showers, MD  simvastatin (ZOCOR) 20 MG tablet Take 1 tablet (20 mg total) by mouth once. 03/08/14   Elby Showers, MD  telmisartan-hydrochlorothiazide (MICARDIS HCT) 80-25 MG per tablet Take 1 tablet by mouth daily. 10/15/13 05/15/14  Elby Showers, MD  vitamin C (ASCORBIC ACID) 500 MG tablet Take 500 mg by mouth daily.    Historical Provider, MD   BP 151/87  Pulse 66  Temp(Src) 98.1 F (36.7 C) (Oral)  Resp 20  SpO2 97% Physical Exam  Constitutional: She is oriented to person, place, and time. She appears well-developed and well-nourished. No distress.  HENT:  Head: Normocephalic and atraumatic.  Eyes: EOM are normal.  Neck: Normal range of motion. Neck supple.  Cardiovascular: Normal rate.   Pulmonary/Chest: Effort normal. No respiratory distress.  Musculoskeletal: She exhibits tenderness. She exhibits no edema.  Marked tenderness right lower most back and hip musculature, lateral thigh and calf. No edema or discoloration. No swelling. Ambulates with a limp.   Neurological: She is alert and oriented to person, place, and time. No cranial nerve deficit.  Skin: Skin is warm and dry.    ED Course  Procedures (including critical care time) Labs Review Labs Reviewed - No data to display  Imaging Review No results found.   MDM   1. Right hip pain   2. Strain of right hip   3. Repetitive strain injury of lower back   4. Muscle strain of thigh    Toradol 60 mg IM Mobic 25 mg Norco 5 mg . #15. We discussed in detail her previous reaction to codeine and her taking Norco. She believes she can take that and wants to try it. Advised if having allergy reaction sx's to stop it.  Heat, stretches     Janne Napoleon, NP 04/24/14 1332

## 2014-04-24 NOTE — ED Provider Notes (Signed)
Medical screening examination/treatment/procedure(s) were performed by resident physician or non-physician practitioner and as supervising physician I was immediately available for consultation/collaboration.   Pauline Good MD.   Billy Fischer, MD 04/24/14 [phone]

## 2014-04-27 ENCOUNTER — Ambulatory Visit
Admission: RE | Admit: 2014-04-27 | Discharge: 2014-04-27 | Disposition: A | Discharge status: Home / Self Care | Payer: BC Managed Care – PPO | Source: Ambulatory Visit | Attending: Internal Medicine | Admitting: Internal Medicine

## 2014-04-27 ENCOUNTER — Encounter: Payer: Self-pay | Admitting: Internal Medicine

## 2014-04-27 ENCOUNTER — Other Ambulatory Visit: Payer: BC Managed Care – PPO

## 2014-04-27 ENCOUNTER — Ambulatory Visit (INDEPENDENT_AMBULATORY_CARE_PROVIDER_SITE_OTHER): Payer: BC Managed Care – PPO | Admitting: Internal Medicine

## 2014-04-27 VITALS — BP 140/88 | HR 60 | Temp 98.0°F | Wt 208.0 lb

## 2014-04-27 DIAGNOSIS — M25569 Pain in unspecified knee: Secondary | ICD-10-CM

## 2014-04-27 DIAGNOSIS — M25551 Pain in right hip: Secondary | ICD-10-CM

## 2014-04-27 DIAGNOSIS — M25559 Pain in unspecified hip: Secondary | ICD-10-CM

## 2014-04-27 MED ORDER — MELOXICAM 15 MG PO TABS: 15.0000 mg | ORAL_TABLET | Freq: Every day | ORAL | Status: DC

## 2014-04-27 NOTE — Patient Instructions (Signed)
Take Mobic 15 mg daily for 2 weeks regularly. Ice knee down for 20 minutes daily. Stay away from gym for 2 weeks. Take pain medication sparingly if needed for severe pain. Have x-rays.

## 2014-04-27 NOTE — Progress Notes (Signed)
   Subjective:    Patient ID: Nancy Wagner, female    DOB: [birth year], 63 y.o.   MRN: [id number]  HPI Patient was seen 04/24/2014 at [facility] urgent care complaining of right lower extremity pain for several weeks. She is going to a gym working out with a trainer several days a week. Says she has had pain in her right buttock, right lateral hip, right knee. Sometimes feels the knee maybe giving away with her. All of this started initially when she jammed her right great toe while doing some exercises. Has not noticed any swelling of the knee. At the urgent care on May 26 she was given Mobic and hydrocodone/APAP. She has only taken 1 Mobic tablet. Says hydrocodone/APAP is making her drowsy. Says she was told to followup with primary care doctor. Has not seen an orthopedist. Has not had x-rays.    Review of Systems     Objective:   Physical Exam right toe tender at first MTP joint without swelling or increased warmth or redness. No sign of abrasions. Right ankle: Full range of motion of right ankle. No swelling of the ankle. Some tenderness right lateral lower leg. Knee exam: No effusion. No joint line tenderness. Tenderness along the right lateral collateral ligament. Some tenderness around the patella with flexion. Right hip: No pain with internal or external rotation. Straight leg raising is negative at 90 on the right and muscle strength is normal.        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 Needs to take this for 2-4 weeks at dose of 15 mg daily with a meal. Take pain medication sparingly. Avoid exercise at gym for 2 weeks. Ice knee for 20 minutes daily. She's going to have x-rays of the right knee and right hip. If not better in 2 weeks, we will refer her to orthopedist. She may need physical  therapy.

## 2014-04-27 NOTE — Progress Notes (Signed)
Attempted to call patient at home and on her cell. Both mailboxes full and could not leave messages.

## 2014-05-14 ENCOUNTER — Telehealth: Payer: Self-pay | Admitting: Internal Medicine

## 2014-05-14 NOTE — Telephone Encounter (Signed)
Please get pt an appt at [facility] [phone] re right knee pain not responding to MObic. Xrays were done at [facility]. Note done.

## 2014-05-15 NOTE — Telephone Encounter (Signed)
Patient scheduled for an appointment with Dierdre Highman, PA at Dr. Reather Littler office on 05/28/2014 at 2:15pm. Informed of this appointment.

## 2014-05-16 ENCOUNTER — Other Ambulatory Visit: Payer: Self-pay | Admitting: Internal Medicine

## 2014-07-10 ENCOUNTER — Other Ambulatory Visit: Payer: BC Managed Care – PPO | Admitting: Internal Medicine

## 2014-07-10 DIAGNOSIS — Z79899 Other long term (current) drug therapy: Secondary | ICD-10-CM

## 2014-07-10 DIAGNOSIS — E119 Type 2 diabetes mellitus without complications: Secondary | ICD-10-CM

## 2014-07-10 DIAGNOSIS — E785 Hyperlipidemia, unspecified: Secondary | ICD-10-CM

## 2014-07-10 LAB — HEPATIC FUNCTION PANEL
ALBUMIN: 3.9 g/dL (ref 3.5–5.2)
ALT: 14 U/L (ref 0–35)
AST: 14 U/L (ref 0–37)
Alkaline Phosphatase: 46 U/L (ref 39–117)
BILIRUBIN DIRECT: 0.1 mg/dL (ref 0.0–0.3)
Indirect Bilirubin: 0.3 mg/dL (ref 0.2–1.2)
Total Bilirubin: 0.4 mg/dL (ref 0.2–1.2)
Total Protein: 8.2 g/dL (ref 6.0–8.3)

## 2014-07-10 LAB — HEMOGLOBIN A1C
HEMOGLOBIN A1C: 7.3 % — AB (ref <5.7)
MEAN PLASMA GLUCOSE: 163 mg/dL — AB (ref <117)

## 2014-07-10 LAB — LIPID PANEL
CHOL/HDL RATIO: 2.6 ratio
Cholesterol: 184 mg/dL (ref 0–200)
HDL: 70 mg/dL (ref >39)
LDL Cholesterol: 97 mg/dL (ref 0–99)
Triglycerides: 87 mg/dL (ref <150)
VLDL: 17 mg/dL (ref 0–40)

## 2014-07-12 ENCOUNTER — Encounter: Payer: Self-pay | Admitting: Internal Medicine

## 2014-07-12 ENCOUNTER — Ambulatory Visit (INDEPENDENT_AMBULATORY_CARE_PROVIDER_SITE_OTHER): Payer: BC Managed Care – PPO | Admitting: Internal Medicine

## 2014-07-12 VITALS — BP 136/80 | HR 64 | Temp 98.1°F | Wt 209.0 lb

## 2014-07-12 DIAGNOSIS — E785 Hyperlipidemia, unspecified: Secondary | ICD-10-CM

## 2014-07-12 DIAGNOSIS — I10 Essential (primary) hypertension: Secondary | ICD-10-CM

## 2014-07-12 DIAGNOSIS — E119 Type 2 diabetes mellitus without complications: Secondary | ICD-10-CM | POA: Insufficient documentation

## 2014-07-12 DIAGNOSIS — E1169 Type 2 diabetes mellitus with other specified complication: Secondary | ICD-10-CM | POA: Insufficient documentation

## 2014-07-12 DIAGNOSIS — E669 Obesity, unspecified: Secondary | ICD-10-CM

## 2014-07-12 MED ORDER — CLONIDINE HCL 0.3 MG PO TABS: ORAL_TABLET | ORAL | Status: DC

## 2014-07-12 NOTE — Progress Notes (Signed)
   Subjective:    Patient ID: Nancy Wagner, female    DOB: January 26, 1951, 63 y.o.   MRN: [id number]  HPI  patient in today for recheck on diabetes mellitus, hyperlipidemia, hypertension. Lipid panel liver functions were normal on statin. She recently had a cortisone injection in her knee about  2 weeks ago and orthopedist told her that would increase her glucose readings. Hemoglobin A1c has increased from  7% to 7.3%.  She's keeping 2 grandchildren this summer. Says her blood pressure may be elevated from that. Says that she's currently taking clonidine 0.2 mg one tablet in the morning and 2 tablets at bedtime. I'm going to change this regimen to 0.3 mg twice daily. Says she's had all of her blood pressure medications this morning but has been rushing around. She says she did not sleep well last night because there was a mouse in the house.    Review of Systems     Objective:   Physical Exam  Chest clear to auscultation. Cardiac exam regular rate and rhythm. Extremities without edema. Diabetic foot exam normal.       Assessment & Plan:  Hypertension-stable at 136/80. Rechecked and similar results obtained  Diabetes mellitus. Hemoglobin A1c 7.3%  Hyperlipidemia-lipid panel liver functions normal  Obesity-needs to diet and exercise  Plan: Return in October for followup at which time she'll have blood pressure check and hemoglobin A1c

## 2014-07-12 NOTE — Patient Instructions (Addendum)
Increase clonidine to 0.3 mg twice daily. Return in October. Watch diet and exercise. She has gained 4 pounds since April.

## 2014-08-07 ENCOUNTER — Encounter: Payer: Self-pay | Admitting: Internal Medicine

## 2014-08-07 ENCOUNTER — Telehealth: Payer: Self-pay | Admitting: Internal Medicine

## 2014-08-07 ENCOUNTER — Ambulatory Visit (INDEPENDENT_AMBULATORY_CARE_PROVIDER_SITE_OTHER): Payer: BC Managed Care – PPO | Admitting: Internal Medicine

## 2014-08-07 VITALS — BP 140/80 | HR 66 | Ht 63.0 in | Wt 211.0 lb

## 2014-08-07 DIAGNOSIS — I10 Essential (primary) hypertension: Secondary | ICD-10-CM

## 2014-08-07 MED ORDER — CLONIDINE HCL 0.1 MG PO TABS: 0.1000 mg | ORAL_TABLET | Freq: Every day | ORAL | Status: DC

## 2014-08-07 MED ORDER — CLONIDINE HCL 0.3 MG PO TABS: ORAL_TABLET | ORAL | Status: DC

## 2014-08-07 NOTE — Telephone Encounter (Signed)
Spoke with Dr. Renold Genta; per last OV note on 07/12/14, patient stated she was taking Clonidine 0.2 mg 1 in the a.m. And 2 at bedtime.  Dr. Renold Genta advised her to finish the meds and increase Clonidine to 0.3 mg twice a day.  She wants patient to come in TODAY and we'll work her in to go over her medication.   Spoke with patient and advised to please arrive ASAP and we'll work her in.  Requested that patient bring both the old medication bottle as well as the new bottle.  Patient verbalized understanding and will get here as soon as she possibly can.

## 2014-08-07 NOTE — Telephone Encounter (Signed)
OV today 

## 2014-08-07 NOTE — Patient Instructions (Signed)
Take Clonidine 0.3  mg at bedtime and 0.1 mg in am

## 2014-08-21 ENCOUNTER — Other Ambulatory Visit: Payer: Self-pay

## 2014-08-21 MED ORDER — ATENOLOL 25 MG PO TABS: 100.0000 mg | ORAL_TABLET | Freq: Every day | ORAL | Status: DC

## 2014-09-11 ENCOUNTER — Encounter: Payer: Self-pay | Admitting: Internal Medicine

## 2014-09-11 ENCOUNTER — Ambulatory Visit (INDEPENDENT_AMBULATORY_CARE_PROVIDER_SITE_OTHER): Payer: BC Managed Care – PPO | Admitting: Internal Medicine

## 2014-09-11 VITALS — BP 130/86 | HR 78 | Temp 98.4°F | Ht 63.0 in | Wt 208.0 lb

## 2014-09-11 DIAGNOSIS — J069 Acute upper respiratory infection, unspecified: Secondary | ICD-10-CM

## 2014-09-11 DIAGNOSIS — I10 Essential (primary) hypertension: Secondary | ICD-10-CM

## 2014-09-11 DIAGNOSIS — E119 Type 2 diabetes mellitus without complications: Secondary | ICD-10-CM

## 2014-09-11 LAB — HEMOGLOBIN A1C
Hgb A1c MFr Bld: 7.2 % — ABNORMAL HIGH (ref <5.7)
Mean Plasma Glucose: 160 mg/dL — ABNORMAL HIGH (ref <117)

## 2014-09-11 MED ORDER — BENZONATATE 100 MG PO CAPS: 200.0000 mg | ORAL_CAPSULE | Freq: Three times a day (TID) | ORAL | Status: DC | PRN

## 2014-09-11 MED ORDER — ALBUTEROL SULFATE HFA 108 (90 BASE) MCG/ACT IN AERS: 2.0000 | INHALATION_SPRAY | Freq: Four times a day (QID) | RESPIRATORY_TRACT | Status: DC | PRN

## 2014-09-11 MED ORDER — LEVOFLOXACIN 500 MG PO TABS: 500.0000 mg | ORAL_TABLET | Freq: Every day | ORAL | Status: DC

## 2014-09-11 NOTE — Patient Instructions (Addendum)
Use Albuterol inhaler as directed.Take antibiotic as directed. Use Tessalon perles for cough. Continue same blood pressure medication. Physical exam due in 3-6 months

## 2014-09-11 NOTE — Progress Notes (Signed)
   Subjective:    Patient ID: Nancy Wagner, female    DOB: [birth year], 63 y.o.   MRN: [id number]  HPI  URI symptoms for several days. No fever . No chills. Cough with white sputum. No sore throat now but was scratchy initially. A lot of coughing. She was in the [place] and returned after which she developed a scratchy throat. Then she went to visit her sister and was exposed to some off walls and the cough got worse. Also followup on hypertension and diabetes mellitus. Hemoglobin A1c drawn.    Review of Systems     Objective:   Physical Exam TMs are slightly full bilaterally but not red. Pharynx slightly injected without exudate. Neck is supple. Chest clear. Cardiac exam regular rate and rhythm. Extremities without edema.       Assessment & Plan:  Hypertension-stable  Acute URI  Type 2 diabetes mellitus  Plan: Hemoglobin A1c pending. Prescribe Levaquin 500 milligrams daily for 7 days. Albuterol inhaler 2 sprays by mouth 4 times daily as needed. Tessalon Perles 200 mg 3 times daily as needed for cough. Call if not better in 7-10 days.

## 2014-09-12 ENCOUNTER — Other Ambulatory Visit: Payer: Self-pay | Admitting: Internal Medicine

## 2014-09-12 DIAGNOSIS — Z1231 Encounter for screening mammogram for malignant neoplasm of breast: Secondary | ICD-10-CM

## 2014-09-18 ENCOUNTER — Ambulatory Visit (HOSPITAL_COMMUNITY)
Admission: RE | Admit: 2014-09-18 | Discharge: 2014-09-18 | Disposition: A | Discharge status: Home / Self Care | Payer: BC Managed Care – PPO | Source: Ambulatory Visit | Attending: Internal Medicine | Admitting: Internal Medicine

## 2014-09-18 DIAGNOSIS — Z1231 Encounter for screening mammogram for malignant neoplasm of breast: Secondary | ICD-10-CM | POA: Diagnosis present

## 2014-09-20 ENCOUNTER — Telehealth: Payer: Self-pay

## 2014-09-20 MED ORDER — CLONIDINE HCL 0.1 MG PO TABS: 0.1000 mg | ORAL_TABLET | Freq: Every day | ORAL | Status: DC

## 2014-09-20 NOTE — Telephone Encounter (Signed)
Patient is taking 0.1 mg am and 0.3mg  pm.  She had run out of 0.1mg  early due to having to take them more often before getting regulated.  New rx sent to pharmacy.

## 2014-09-20 NOTE — Telephone Encounter (Signed)
It is my understanding she was taking Catapres 0.1 mg in am and 0.3 mg in pm from Sept visit. Is this NOT correct? Please call her and clarify. I need to write an addendum once you know.

## 2014-09-20 NOTE — Telephone Encounter (Signed)
Refill request form [facility] for catapres 0.1mg .  Patient says she is taking this more than once daily.  Please clarify.

## 2014-10-01 ENCOUNTER — Encounter: Payer: Self-pay | Admitting: Internal Medicine

## 2014-10-21 ENCOUNTER — Encounter: Payer: Self-pay | Admitting: Internal Medicine

## 2014-10-21 NOTE — Progress Notes (Signed)
   Subjective:    Patient ID: Nancy Wagner, female    DOB: 03-04-1951, 63 y.o.   MRN: [id number]  HPI   At  last visit was changed to Clonidine 0.3 mg twice daily. Has not felt well on this regimen. At times feels dizzy, tired and can't think straight. Did not stop taking clonidine abruptly so we can't blame the situation on stopping medication abruptly.    Review of Systems     Objective:   Physical Exam  Neck is supple without JVD thyromegaly or carotid bruits. Chest clear to auscultation. Cardiac exam regular rate and rhythm normal S1 and S2. She is not orthostatic. Extremities without edema. She is alert and or need 3 with no gross focal deficits on brief neurological exam      Assessment & Plan:  Fatigue on blood pressure medication  Plan: Change clonidine to 0.3 mg in the morning and 0.1 mg at bedtime. Has appointment for follow-up in October.

## 2014-11-20 ENCOUNTER — Other Ambulatory Visit: Payer: Self-pay | Admitting: Internal Medicine

## 2014-12-15 ENCOUNTER — Other Ambulatory Visit: Payer: Self-pay | Admitting: Internal Medicine

## 2015-01-01 LAB — HM DIABETES EYE EXAM

## 2015-03-11 ENCOUNTER — Other Ambulatory Visit: Payer: Self-pay | Admitting: Internal Medicine

## 2015-03-11 NOTE — Telephone Encounter (Signed)
Past due for 6 month recheck. Please refill x 30 days call her and make appt.

## 2015-03-12 ENCOUNTER — Other Ambulatory Visit: Payer: Self-pay | Admitting: Internal Medicine

## 2015-03-12 NOTE — Telephone Encounter (Signed)
Needed appt before refilling all meds. Did you call her? This is another refill.

## 2015-03-12 NOTE — Telephone Encounter (Signed)
Sent refill on Prandamet. Patient has scheduled appt

## 2015-03-26 ENCOUNTER — Other Ambulatory Visit: Payer: Medicare Other | Admitting: Internal Medicine

## 2015-03-26 DIAGNOSIS — Z79899 Other long term (current) drug therapy: Secondary | ICD-10-CM

## 2015-03-26 DIAGNOSIS — E119 Type 2 diabetes mellitus without complications: Secondary | ICD-10-CM

## 2015-03-26 DIAGNOSIS — E785 Hyperlipidemia, unspecified: Secondary | ICD-10-CM

## 2015-03-26 LAB — HEMOGLOBIN A1C
Hgb A1c MFr Bld: 7.6 % — ABNORMAL HIGH (ref <5.7)
MEAN PLASMA GLUCOSE: 171 mg/dL — AB (ref <117)

## 2015-03-27 LAB — HEPATIC FUNCTION PANEL
ALT: 15 U/L (ref 0–35)
AST: 16 U/L (ref 0–37)
Albumin: 3.8 g/dL (ref 3.5–5.2)
Alkaline Phosphatase: 43 U/L (ref 39–117)
BILIRUBIN TOTAL: 0.6 mg/dL (ref 0.2–1.2)
Bilirubin, Direct: 0.1 mg/dL (ref 0.0–0.3)
Indirect Bilirubin: 0.5 mg/dL (ref 0.2–1.2)
TOTAL PROTEIN: 8.1 g/dL (ref 6.0–8.3)

## 2015-03-27 LAB — LIPID PANEL
Cholesterol: 189 mg/dL (ref 0–200)
HDL: 60 mg/dL (ref >=46)
LDL Cholesterol: 107 mg/dL — ABNORMAL HIGH (ref 0–99)
Total CHOL/HDL Ratio: 3.2 Ratio
Triglycerides: 110 mg/dL (ref <150)
VLDL: 22 mg/dL (ref 0–40)

## 2015-04-01 ENCOUNTER — Other Ambulatory Visit: Payer: Self-pay | Admitting: Internal Medicine

## 2015-04-02 ENCOUNTER — Encounter: Payer: Self-pay | Admitting: Internal Medicine

## 2015-04-02 ENCOUNTER — Ambulatory Visit (INDEPENDENT_AMBULATORY_CARE_PROVIDER_SITE_OTHER): Payer: Medicare Other | Admitting: Internal Medicine

## 2015-04-02 VITALS — BP 146/80 | HR 79 | Temp 98.0°F | Resp 12 | Wt 217.0 lb

## 2015-04-02 DIAGNOSIS — E119 Type 2 diabetes mellitus without complications: Secondary | ICD-10-CM | POA: Diagnosis not present

## 2015-04-02 DIAGNOSIS — I10 Essential (primary) hypertension: Secondary | ICD-10-CM

## 2015-04-02 DIAGNOSIS — E785 Hyperlipidemia, unspecified: Secondary | ICD-10-CM | POA: Diagnosis not present

## 2015-04-02 MED ORDER — ATENOLOL 100 MG PO TABS: 100.0000 mg | ORAL_TABLET | Freq: Every day | ORAL | Status: DC

## 2015-04-02 MED ORDER — AMLODIPINE BESYLATE 5 MG PO TABS: 5.0000 mg | ORAL_TABLET | Freq: Every day | ORAL | Status: DC

## 2015-04-02 NOTE — Patient Instructions (Addendum)
Return in 4 weeks for BP check. Add Norvasc 5 mg daily.

## 2015-04-02 NOTE — Telephone Encounter (Signed)
Change  To 100 mg daily

## 2015-04-02 NOTE — Progress Notes (Signed)
   Subjective:    Patient ID: Nancy Wagner, female    DOB: 1951-05-31, 64 y.o.   MRN: [id number]  HPI For six-month recheck on hypertension, diabetes mellitus, hyperlipidemia. Says she's been hypertensive since she was a teenager. She is on multiple medications. Drugstore has been giving her 25 mg tablets of atenolol and she has to take for the time. We're going to try to change that to 100 mg daily. I think they have 100 mg tablets in stock nail. She remains on PrandiMet for diabetes. Hemoglobin A1c is stable at 7.6% and previously was 7.2%. Says she's been working out regularly with a Clinical research associate. Despite that she's gained about 8 pounds. LDL cholesterol is 107, total cholesterol is 189 and triglycerides are 110 with an HDL cholesterol of 60. This is the highest hemoglobin A1c she's had since September 2014. She may need to watch her diet a bit more.  I'm still not completely satisfied with her blood pressure. It tends to be labile particularly when she first presents to the office. She says she gets anxious. She says it's been running in the upper 140s at home. I'm going to add amlodipine 5 mg daily to her regimen.    Review of Systems     Objective:   Physical Exam  Neck is supple without JVD thyromegaly or carotid bruits. Chest clear to auscultation. Cardiac exam regular rate and rhythm normal S1 and S2. Extremities without edema.      Assessment & Plan:   Essential hypertension which is difficult to control on multidrug regimen. Add amlodipine 5 mg daily and return for blood pressure check in 4 weeks  Type 2 diabetes mellitus-stable. Continue to watch diet  Hyperlipidemia-stable on statin medication  Plan: Physical exam due in 6 months

## 2015-05-07 ENCOUNTER — Ambulatory Visit: Payer: Medicare Other | Admitting: Internal Medicine

## 2015-05-09 ENCOUNTER — Encounter: Payer: Self-pay | Admitting: Internal Medicine

## 2015-05-09 ENCOUNTER — Ambulatory Visit (INDEPENDENT_AMBULATORY_CARE_PROVIDER_SITE_OTHER): Payer: Medicare Other | Admitting: Internal Medicine

## 2015-05-09 VITALS — BP 138/84 | HR 63 | Temp 97.2°F | Ht 63.0 in | Wt 210.0 lb

## 2015-05-09 DIAGNOSIS — B349 Viral infection, unspecified: Secondary | ICD-10-CM | POA: Diagnosis not present

## 2015-05-09 DIAGNOSIS — I10 Essential (primary) hypertension: Secondary | ICD-10-CM | POA: Diagnosis not present

## 2015-05-09 NOTE — Progress Notes (Signed)
   Subjective:    Patient ID: Nancy Wagner, female    DOB: [birth year], 64 y.o.   MRN: [id number]  HPI  Follow-up on hypertension after adding amlodipine 5 mg daily to current regimen on May 3. Has not had amlodipine today because she fell a sleep. Says she's had some nausea and diarrhea today. No fever or shaking chills. Doesn't recall eating anything that would cause gastroenteritis. Feeling better after a nap.    Review of Systems     Objective:   Physical Exam  Blood pressure is stable at 130/80 today with large cuff left arm. Says that her blood pressure was 132 systolically this morning at home. Chest clear to auscultation. Cardiac exam regular rate and rhythm. Extremities without edema.      Assessment & Plan:  Essential hypertension-improved with addition of amlodipine. Needs to take amlodipine daily at noon.  Probable viral syndrome-gastroenteritis  Plan: Advise clear liquids until nausea and diarrhea have resolved and advance diet slowly. Hypertension improved with addition of amlodipine. Physical exam scheduled for December 2016.

## 2015-05-09 NOTE — Patient Instructions (Signed)
Continue medications as previously prescribed. Watch blood pressure at home. Call if persistently elevated. Trial of clear liquids and advance diet slowly for probable viral gastroenteritis. Return December for physical examination.

## 2015-05-17 ENCOUNTER — Other Ambulatory Visit: Payer: Self-pay | Admitting: Internal Medicine

## 2015-05-22 ENCOUNTER — Other Ambulatory Visit: Payer: Self-pay | Admitting: Internal Medicine

## 2015-06-17 ENCOUNTER — Other Ambulatory Visit: Payer: Self-pay | Admitting: Internal Medicine

## 2015-07-03 ENCOUNTER — Other Ambulatory Visit: Payer: Self-pay | Admitting: Internal Medicine

## 2015-07-29 ENCOUNTER — Encounter: Payer: Self-pay | Admitting: Internal Medicine

## 2015-08-17 ENCOUNTER — Other Ambulatory Visit: Payer: Self-pay | Admitting: Internal Medicine

## 2015-09-04 ENCOUNTER — Other Ambulatory Visit: Payer: Self-pay | Admitting: *Deleted

## 2015-09-04 MED ORDER — ONETOUCH DELICA LANCETS 33G MISC: Status: DC

## 2015-09-04 MED ORDER — GLUCOSE BLOOD VI STRP: ORAL_STRIP | Status: DC

## 2015-09-04 NOTE — Telephone Encounter (Signed)
Rx for lancets and test strips sent to patient pharmacy

## 2015-09-05 ENCOUNTER — Other Ambulatory Visit: Payer: Self-pay

## 2015-09-05 DIAGNOSIS — Z1231 Encounter for screening mammogram for malignant neoplasm of breast: Secondary | ICD-10-CM

## 2015-09-15 ENCOUNTER — Other Ambulatory Visit: Payer: Self-pay | Admitting: Internal Medicine

## 2015-10-01 ENCOUNTER — Ambulatory Visit: Payer: BC Managed Care – PPO

## 2015-10-13 ENCOUNTER — Other Ambulatory Visit: Payer: Self-pay | Admitting: Internal Medicine

## 2015-11-06 ENCOUNTER — Ambulatory Visit
Admission: RE | Admit: 2015-11-06 | Discharge: 2015-11-06 | Disposition: A | Discharge status: Home / Self Care | Payer: Medicare Other | Source: Ambulatory Visit

## 2015-11-06 DIAGNOSIS — Z1231 Encounter for screening mammogram for malignant neoplasm of breast: Secondary | ICD-10-CM

## 2015-11-11 ENCOUNTER — Other Ambulatory Visit: Payer: Medicare Other | Admitting: Internal Medicine

## 2015-11-14 ENCOUNTER — Other Ambulatory Visit: Payer: Self-pay | Admitting: Internal Medicine

## 2015-11-14 ENCOUNTER — Encounter: Payer: Medicare Other | Admitting: Internal Medicine

## 2015-12-09 ENCOUNTER — Other Ambulatory Visit: Payer: Medicare Other | Admitting: Internal Medicine

## 2015-12-09 ENCOUNTER — Other Ambulatory Visit: Payer: Self-pay | Admitting: Internal Medicine

## 2015-12-11 ENCOUNTER — Other Ambulatory Visit: Payer: Self-pay | Admitting: Internal Medicine

## 2015-12-11 DIAGNOSIS — I10 Essential (primary) hypertension: Secondary | ICD-10-CM

## 2015-12-11 DIAGNOSIS — E118 Type 2 diabetes mellitus with unspecified complications: Secondary | ICD-10-CM

## 2015-12-11 DIAGNOSIS — E785 Hyperlipidemia, unspecified: Secondary | ICD-10-CM

## 2015-12-11 DIAGNOSIS — Z0000 Encounter for general adult medical examination without abnormal findings: Secondary | ICD-10-CM

## 2015-12-11 DIAGNOSIS — Z1329 Encounter for screening for other suspected endocrine disorder: Secondary | ICD-10-CM

## 2015-12-11 DIAGNOSIS — E559 Vitamin D deficiency, unspecified: Secondary | ICD-10-CM

## 2015-12-11 DIAGNOSIS — Z130 Encounter for screening for diseases of the blood and blood-forming organs and certain disorders involving the immune mechanism: Secondary | ICD-10-CM

## 2015-12-11 LAB — COMPLETE METABOLIC PANEL WITH GFR
ALT: 13 U/L (ref 6–29)
AST: 14 U/L (ref 10–35)
Albumin: 3.9 g/dL (ref 3.6–5.1)
Alkaline Phosphatase: 43 U/L (ref 33–130)
BUN: 16 mg/dL (ref 7–25)
CALCIUM: 9.7 mg/dL (ref 8.6–10.4)
CO2: 27 mmol/L (ref 20–31)
CREATININE: 0.92 mg/dL (ref 0.50–0.99)
Chloride: 99 mmol/L (ref 98–110)
GFR, Est African American: 75 mL/min (ref >=60)
GFR, Est Non African American: 65 mL/min (ref >=60)
Glucose, Bld: 112 mg/dL — ABNORMAL HIGH (ref 65–99)
POTASSIUM: 3.9 mmol/L (ref 3.5–5.3)
Sodium: 138 mmol/L (ref 135–146)
Total Bilirubin: 0.6 mg/dL (ref 0.2–1.2)
Total Protein: 7.8 g/dL (ref 6.1–8.1)

## 2015-12-11 LAB — LIPID PANEL
CHOL/HDL RATIO: 3.5 ratio (ref <=5.0)
Cholesterol: 215 mg/dL — ABNORMAL HIGH (ref 125–200)
HDL: 61 mg/dL (ref >=46)
LDL Cholesterol: 131 mg/dL — ABNORMAL HIGH (ref <130)
Triglycerides: 116 mg/dL (ref <150)
VLDL: 23 mg/dL (ref <30)

## 2015-12-11 LAB — TSH: TSH: 1.751 u[IU]/mL (ref 0.350–4.500)

## 2015-12-12 LAB — CBC WITH DIFFERENTIAL/PLATELET
Basophils Absolute: 0 10*3/uL (ref 0.0–0.1)
Basophils Relative: 0 % (ref 0–1)
Eosinophils Absolute: 0.2 10*3/uL (ref 0.0–0.7)
Eosinophils Relative: 2 % (ref 0–5)
HEMATOCRIT: 40.3 % (ref 36.0–46.0)
HEMOGLOBIN: 13.2 g/dL (ref 12.0–15.0)
LYMPHS PCT: 47 % — AB (ref 12–46)
Lymphs Abs: 3.9 10*3/uL (ref 0.7–4.0)
MCH: 30 pg (ref 26.0–34.0)
MCHC: 32.8 g/dL (ref 30.0–36.0)
MCV: 91.6 fL (ref 78.0–100.0)
MONOS PCT: 7 % (ref 3–12)
MPV: 11 fL (ref 8.6–12.4)
Monocytes Absolute: 0.6 10*3/uL (ref 0.1–1.0)
Neutro Abs: 3.7 10*3/uL (ref 1.7–7.7)
Neutrophils Relative %: 44 % (ref 43–77)
Platelets: 265 10*3/uL (ref 150–400)
RBC: 4.4 MIL/uL (ref 3.87–5.11)
RDW: 14.4 % (ref 11.5–15.5)
WBC: 8.3 10*3/uL (ref 4.0–10.5)

## 2015-12-12 LAB — HEMOGLOBIN A1C
HEMOGLOBIN A1C: 7.8 % — AB (ref <5.7)
Mean Plasma Glucose: 177 mg/dL — ABNORMAL HIGH (ref <117)

## 2015-12-12 LAB — VITAMIN D 25 HYDROXY (VIT D DEFICIENCY, FRACTURES): VIT D 25 HYDROXY: 28 ng/mL — AB (ref 30–100)

## 2015-12-13 ENCOUNTER — Encounter: Payer: Self-pay | Admitting: Internal Medicine

## 2015-12-13 ENCOUNTER — Ambulatory Visit (INDEPENDENT_AMBULATORY_CARE_PROVIDER_SITE_OTHER): Payer: Medicare Other | Admitting: Internal Medicine

## 2015-12-13 VITALS — BP 140/80 | HR 74 | Temp 98.2°F | Resp 20 | Ht 62.5 in | Wt 203.0 lb

## 2015-12-13 DIAGNOSIS — M1712 Unilateral primary osteoarthritis, left knee: Secondary | ICD-10-CM

## 2015-12-13 DIAGNOSIS — E785 Hyperlipidemia, unspecified: Secondary | ICD-10-CM | POA: Diagnosis not present

## 2015-12-13 DIAGNOSIS — E8881 Metabolic syndrome: Secondary | ICD-10-CM | POA: Diagnosis not present

## 2015-12-13 DIAGNOSIS — Z0000 Encounter for general adult medical examination without abnormal findings: Secondary | ICD-10-CM

## 2015-12-13 DIAGNOSIS — E669 Obesity, unspecified: Secondary | ICD-10-CM

## 2015-12-13 DIAGNOSIS — I10 Essential (primary) hypertension: Secondary | ICD-10-CM

## 2015-12-13 DIAGNOSIS — E119 Type 2 diabetes mellitus without complications: Secondary | ICD-10-CM

## 2015-12-13 DIAGNOSIS — E559 Vitamin D deficiency, unspecified: Secondary | ICD-10-CM | POA: Diagnosis not present

## 2015-12-13 NOTE — Progress Notes (Signed)
Subjective:    Patient ID: Nancy Wagner, female    DOB: 1951/01/21, 65 y.o.   MRN: [id number]  HPI 65 year old Black Female for [facility] to [facility] health maintenance exam and evaluation of medical issues. Has been followed here since 2014. She was referred by Juanetta Snow. 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  She had colonoscopy by Dr. Lajoyce Corners  07/28/1998. This was for heme positive stools with tenesmus. She was found to have internal hemorrhoids but no polyps or masses.  She is followed by Dr. Satira Sark, ophthalmologist for diabetic eye exam.  One point she had an infection in her right eye and was seen by Dr. Tomi Likens at [facility]. She was on prednisone for some period of time and doesn't know what type infection she had but says it was not shingles. We do not have those records.  She had pneumonia in 1977. Had throat surgery with Dr. Ernesto Rutherford in 1985. 2 C-sections in [place]. Tubal ligation 1981. Cholecystectomy in early 1980s.  She is intolerant of codeine-causes rash and hives.  She does not smoke or consume alcohol. She has 2 adult children, a son and a daughter, in good health. She is married. Husband formerly worked as an Land but is now Brewing technologist at [employer] is [employer].  She goes to the gym a couple times a week.  In 2012 she had her left knee injected with steroids for osteoarthritis.  Previously seen by Dr. Ashby Dawes with history of labile hypertension.  Family history: Father in his late 35s and mother in her early 71s in good health. 4 brothers living in good health. 4 sisters 3 of whom are in good health but 1 in her late 92s with history of MI and other health issues.    Review of Systems  Constitutional: Negative.   All other systems reviewed and are negative.      Objective:   Physical Exam  Constitutional: She is oriented to person, place, and time.  She appears well-developed and well-nourished. No distress.  HENT:  Head: Normocephalic and atraumatic.  Right Ear: External ear normal.  Left Ear: External ear normal.  Mouth/Throat: Oropharynx is clear and moist. No oropharyngeal exudate.  Eyes: Conjunctivae and EOM are normal. Pupils are equal, round, and reactive to light. Right eye exhibits no discharge. Left eye exhibits no discharge. No scleral icterus.  Neck: Neck supple. No JVD present. No thyromegaly present.  Cardiovascular: Normal rate, regular rhythm, normal heart sounds and intact distal pulses.   No murmur heard. Pulmonary/Chest: Effort normal and breath sounds normal. She has no wheezes. She has no rales.  Breasts normal female without masses  Abdominal: Soft. Bowel sounds are normal. She exhibits no distension and no mass. There is no tenderness. There is no rebound and no guarding.  Musculoskeletal: She exhibits no edema.  Lymphadenopathy:    She has no cervical adenopathy.  Neurological: She is alert and oriented to person, place, and time. She has normal reflexes. No cranial nerve deficit. Coordination normal.  Skin: Skin is warm and dry. No rash noted. She is not diaphoretic.  Psychiatric: She has a normal mood and affect. Her behavior is normal. Judgment and thought content normal.  Vitals reviewed.         Assessment & Plan:  Essential hypertension-stable with 3 drug regimen  Hyperlipidemia  Controlled type 2 diabetes mellitus-hemoglobin A1c is increased 7.8%.  Needs to watch diet more closely  Obesity-Watch diet and exercise  Metabolic syndrome  Primary osteoarthritis left knee  Plan: Continue same medications. Work hard on diet and exercise and return in 6 months.  Osteoarthritis left knee  Vitamin D deficiency-take 2000 units vitamin D 3 daily  Plan: Continue same medications and return in 6 months.  Subjective:   Patient presents for Medicare Annual/Subsequent preventive examination.  Review  Past Medical/Family/Social: See above   Risk Factors  Current exercise habits: Goes to gym Dietary issues discussed: Low fat low carbohydrate  Cardiac risk factors: Hypertension, hyperlipidemia, diabetes, family history of MI and sister  Depression Screen  (Note: if answer to either of the following is "Yes", a more complete depression screening is indicated)   Over the past two weeks, have you felt down, depressed or hopeless? No  Over the past two weeks, have you felt little interest or pleasure in doing things? No Have you lost interest or pleasure in daily life? No Do you often feel hopeless? No Do you cry easily over simple problems? No   Activities of Daily Living  In your present state of health, do you have any difficulty performing the following activities?:   Driving? No  Managing money? No  Feeding yourself? No  Getting from bed to chair? No  Climbing a flight of stairs? No  But has persistent knee pain Preparing food and eating?: No  Bathing or showering? No  Getting dressed: No  Getting to the toilet? No but has persistent knee pain Using the toilet:No  Moving around from place to place: No  In the past year have you fallen or had a near fall?: hurt knee when flip flop slipped off foot in [place] parking [place]  Are you sexually active? No  Do you have more than one partner? No   Hearing Difficulties: No  Do you often ask people to speak up or repeat themselves? No  Do you experience ringing or noises in your ears? No  Do you have difficulty understanding soft or whispered voices? No  Do you feel that you have a problem with memory? No Do you often misplace items? No    Home Safety:  Do you have a smoke alarm at your residence? Yes Do you have grab bars in the bathroom?no Do you have throw rugs in your house?yes   Cognitive Testing  Alert? Yes Normal Appearance?Yes  Oriented to person? Yes Place? Yes  Time? Yes  Recall of three objects? Yes  Can perform  simple calculations? Yes  Displays appropriate judgment?Yes  Can read the correct time from a watch face?Yes   List the Names of Other Physician/Practitioners you currently use:  See referral list for the physicians patient is currently seeing.     Review of Systems: See above   Objective:     General appearance: Appears stated age and  obese  Head: Normocephalic, without obvious abnormality, atraumatic  Eyes: conj clear, EOMi PEERLA  Ears: normal TM's and external ear canals both ears  Nose: Nares normal. Septum midline. Mucosa normal. No drainage or sinus tenderness.  Throat: lips, mucosa, and tongue normal; teeth and gums normal  Neck: no adenopathy, no carotid bruit, no JVD, supple, symmetrical, trachea midline and thyroid not enlarged, symmetric, no tenderness/mass/nodules  No CVA tenderness.  Lungs: clear to auscultation bilaterally  Breasts: normal appearance, no masses or tenderness Heart: regular rate and rhythm, S1, S2 normal, no murmur, click, rub or gallop  Abdomen: soft, non-tender;  bowel sounds normal; no masses, no organomegaly  Musculoskeletal: ROM normal in all joints, no crepitus, no deformity, Normal muscle strengthen. Back  is symmetric, no curvature. Skin: Skin color, texture, turgor normal. No rashes or lesions  Lymph nodes: Cervical, supraclavicular, and axillary nodes normal.  Neurologic: CN 2 -12 Normal, Normal symmetric reflexes. Normal coordination and gait  Psych: Alert & Oriented x 3, Mood appear stable.    Assessment:    Annual wellness medicare exam   Plan:    During the course of the visit the patient was educated and counseled about appropriate screening and preventive services including:  Annual mammogram  Consider repeat colonoscopy  Take 2000 units vitamin D 3 daily  Annual flu vaccine suggested  Prevnar 13 suggested      Patient Instructions (the written plan) was given to the patient.  Medicare Attestation  I have  personally reviewed:  The patient's medical and social history  Their use of alcohol, tobacco or illicit drugs  Their current medications and supplements  The patient's functional ability including ADLs,fall risks, home safety risks, cognitive, and hearing and visual impairment  Diet and physical activities  Evidence for depression or mood disorders  The patient's weight, height, BMI, and visual acuity have been recorded in the chart. I have made referrals, counseling, and provided education to the patient based on review of the above and I have provided the patient with a written personalized care plan for preventive services.

## 2015-12-22 ENCOUNTER — Encounter: Payer: Self-pay | Admitting: Internal Medicine

## 2015-12-22 NOTE — Patient Instructions (Addendum)
It was a pleasure to see today. Must watch diet and exercise. Return in 6 months for follow-up.

## 2015-12-23 ENCOUNTER — Telehealth: Payer: Self-pay

## 2015-12-23 NOTE — Telephone Encounter (Signed)
I attempted to contact patient as she needs an EKG. No vcm set up on cell phone. Will try again at a later time.

## 2015-12-24 NOTE — Telephone Encounter (Signed)
Patient will come 12/25/15 @ 11:00 for EKG

## 2015-12-25 ENCOUNTER — Ambulatory Visit (INDEPENDENT_AMBULATORY_CARE_PROVIDER_SITE_OTHER): Payer: Medicare Other | Admitting: Internal Medicine

## 2015-12-25 DIAGNOSIS — Z0000 Encounter for general adult medical examination without abnormal findings: Secondary | ICD-10-CM

## 2015-12-25 NOTE — Progress Notes (Signed)
Patient returned to office today for Welcome to [facility] EKG.

## 2015-12-31 ENCOUNTER — Other Ambulatory Visit: Payer: Self-pay | Admitting: Internal Medicine

## 2016-01-07 ENCOUNTER — Encounter: Payer: Self-pay | Admitting: Internal Medicine

## 2016-01-07 LAB — HM DIABETES EYE EXAM

## 2016-01-13 ENCOUNTER — Ambulatory Visit (INDEPENDENT_AMBULATORY_CARE_PROVIDER_SITE_OTHER): Payer: Medicare Other | Admitting: Internal Medicine

## 2016-01-13 ENCOUNTER — Other Ambulatory Visit: Payer: Self-pay | Admitting: Internal Medicine

## 2016-01-13 ENCOUNTER — Encounter: Payer: Self-pay | Admitting: Internal Medicine

## 2016-01-13 VITALS — BP 132/80 | HR 67 | Temp 97.7°F | Resp 20 | Ht 63.0 in | Wt 200.5 lb

## 2016-01-13 DIAGNOSIS — M545 Low back pain: Secondary | ICD-10-CM

## 2016-01-13 DIAGNOSIS — G44201 Tension-type headache, unspecified, intractable: Secondary | ICD-10-CM | POA: Diagnosis not present

## 2016-01-13 DIAGNOSIS — J029 Acute pharyngitis, unspecified: Secondary | ICD-10-CM

## 2016-01-13 DIAGNOSIS — B349 Viral infection, unspecified: Secondary | ICD-10-CM | POA: Diagnosis not present

## 2016-01-13 DIAGNOSIS — R8290 Unspecified abnormal findings in urine: Secondary | ICD-10-CM | POA: Diagnosis not present

## 2016-01-13 LAB — CBC WITH DIFFERENTIAL/PLATELET
BASOS PCT: 0 % (ref 0–1)
Basophils Absolute: 0 10*3/uL (ref 0.0–0.1)
Eosinophils Absolute: 0.2 10*3/uL (ref 0.0–0.7)
Eosinophils Relative: 2 % (ref 0–5)
HEMATOCRIT: 39 % (ref 36.0–46.0)
HEMOGLOBIN: 12.8 g/dL (ref 12.0–15.0)
LYMPHS ABS: 2.7 10*3/uL (ref 0.7–4.0)
LYMPHS PCT: 28 % (ref 12–46)
MCH: 30 pg (ref 26.0–34.0)
MCHC: 32.8 g/dL (ref 30.0–36.0)
MCV: 91.3 fL (ref 78.0–100.0)
MONO ABS: 0.9 10*3/uL (ref 0.1–1.0)
MONOS PCT: 9 % (ref 3–12)
MPV: 12.4 fL (ref 8.6–12.4)
NEUTROS ABS: 6 10*3/uL (ref 1.7–7.7)
NEUTROS PCT: 61 % (ref 43–77)
Platelets: 242 10*3/uL (ref 150–400)
RBC: 4.27 MIL/uL (ref 3.87–5.11)
RDW: 12.9 % (ref 11.5–15.5)
WBC: 9.8 10*3/uL (ref 4.0–10.5)

## 2016-01-13 LAB — POCT URINALYSIS DIPSTICK
Bilirubin, UA: NEGATIVE
GLUCOSE UA: NEGATIVE
KETONES UA: NEGATIVE
Nitrite, UA: NEGATIVE
PROTEIN UA: NEGATIVE
Spec Grav, UA: 1.025
Urobilinogen, UA: 0.2
pH, UA: 7

## 2016-01-13 LAB — INFLUENZA A AND B AG, IMMUNOASSAY
INFLUENZA B ANTIGEN: NOT DETECTED
Influenza A Antigen: NOT DETECTED

## 2016-01-13 LAB — POCT RAPID STREP A (OFFICE): Rapid Strep A Screen: NEGATIVE

## 2016-01-13 MED ORDER — AMOXICILLIN 500 MG PO CAPS
500.0000 mg | ORAL_CAPSULE | Freq: Three times a day (TID) | ORAL | Status: DC
Start: 2016-01-13 — End: 2016-06-18

## 2016-01-13 MED ORDER — BENZONATATE 100 MG PO CAPS: 200.0000 mg | ORAL_CAPSULE | Freq: Three times a day (TID) | ORAL | Status: DC

## 2016-01-13 MED ORDER — BENZONATATE 100 MG PO CAPS
200.0000 mg | ORAL_CAPSULE | Freq: Three times a day (TID) | ORAL | Status: DC
Start: 2016-01-13 — End: 2016-06-18

## 2016-01-13 NOTE — Patient Instructions (Addendum)
Amoxilicillin [id number] mg 3 times daily x 10 days.  Tessalon Perles as needed for cough during the day. Urine culture is pending. Tylenol or Advil for headache.

## 2016-01-16 ENCOUNTER — Telehealth: Payer: Self-pay

## 2016-01-16 LAB — CULTURE, URINE COMPREHENSIVE

## 2016-01-16 NOTE — Telephone Encounter (Signed)
-----   Message from Elby Showers, MD sent at 01/16/2016  2:22 PM EST ----- Pt has UTI sensitive to Amoxicillin which she is on- needs nurse visit and repeat U/A in 2 weeks

## 2016-01-16 NOTE — Telephone Encounter (Signed)
Contacted patient with the culture report and she states that she thinks she has a sinus infection instead of bronchitis. Her face hurts, her eyes are swollen puffy and her teeth hurt. Please advise

## 2016-01-17 NOTE — Telephone Encounter (Signed)
Patient notified

## 2016-01-17 NOTE — Telephone Encounter (Signed)
Patient is to finish the ABX as this will help with both. Dr. Renold Genta states that it is a good choice for both the UTI as well as the Sinus Infection.

## 2016-01-28 NOTE — Progress Notes (Signed)
   Subjective:    Patient ID: Nancy Wagner, female    DOB: 25-Mar-1951, 65 y.o.   MRN: [id number]  HPI Patient in today complaining of cough,headache and respiratory congestion. Feels that she may have a sinus infection or viral syndrome. No nausea and vomiting or diarrhea. Patient also complaining of urinary symptoms today as well.    Review of Systems     Objective:   Physical Exam Skin warm and dry. Nodes none. TMs are clear. Pharynx is red. Neck is supple. Chest clear to auscultation. She sounds nasally congested when she speaks. Nasal rapid flu test is negative. Rapid strep screen negative.  Has abnormal urinalysis. Culture taken.     Assessment & Plan:  Acute URI  Headache  Viral syndrome  UTI  Plan: Amoxicillin 500 mg 3 times daily for 10 days. Urine culture pending. Tessalon Perles as needed for cough. Tylenol or Advil as needed for headache. Rest and drink plenty of fluids.  Addendum: Urine culture greater than 100,000 colonies per milliliter enterococcus species sensitive to amoxicillin

## 2016-02-12 ENCOUNTER — Other Ambulatory Visit: Payer: Self-pay | Admitting: Internal Medicine

## 2016-03-30 ENCOUNTER — Other Ambulatory Visit: Payer: Self-pay

## 2016-03-30 MED ORDER — ATENOLOL 100 MG PO TABS: 100.0000 mg | ORAL_TABLET | Freq: Every day | ORAL | Status: DC

## 2016-05-25 ENCOUNTER — Other Ambulatory Visit: Payer: Self-pay

## 2016-05-25 MED ORDER — TELMISARTAN-HCTZ 80-25 MG PO TABS: 1.0000 | ORAL_TABLET | Freq: Every day | ORAL | Status: DC

## 2016-05-30 ENCOUNTER — Other Ambulatory Visit: Payer: Self-pay | Admitting: Internal Medicine

## 2016-06-09 ENCOUNTER — Other Ambulatory Visit: Payer: Medicare Other | Admitting: Internal Medicine

## 2016-06-09 ENCOUNTER — Other Ambulatory Visit: Payer: Self-pay | Admitting: Internal Medicine

## 2016-06-11 ENCOUNTER — Ambulatory Visit: Payer: Medicare Other | Admitting: Internal Medicine

## 2016-06-16 ENCOUNTER — Other Ambulatory Visit: Payer: Medicare Other | Admitting: Internal Medicine

## 2016-06-16 DIAGNOSIS — E119 Type 2 diabetes mellitus without complications: Secondary | ICD-10-CM

## 2016-06-16 DIAGNOSIS — E785 Hyperlipidemia, unspecified: Secondary | ICD-10-CM

## 2016-06-16 DIAGNOSIS — Z79899 Other long term (current) drug therapy: Secondary | ICD-10-CM

## 2016-06-16 LAB — HEPATIC FUNCTION PANEL
ALK PHOS: 47 U/L (ref 33–130)
ALT: 10 U/L (ref 6–29)
AST: 13 U/L (ref 10–35)
Albumin: 3.7 g/dL (ref 3.6–5.1)
BILIRUBIN DIRECT: 0.1 mg/dL (ref <=0.2)
BILIRUBIN INDIRECT: 0.4 mg/dL (ref 0.2–1.2)
Total Bilirubin: 0.5 mg/dL (ref 0.2–1.2)
Total Protein: 7.7 g/dL (ref 6.1–8.1)

## 2016-06-16 LAB — LIPID PANEL
Cholesterol: 174 mg/dL (ref 125–200)
HDL: 62 mg/dL (ref >=46)
LDL CALC: 91 mg/dL (ref <130)
TRIGLYCERIDES: 104 mg/dL (ref <150)
Total CHOL/HDL Ratio: 2.8 Ratio (ref <=5.0)
VLDL: 21 mg/dL (ref <30)

## 2016-06-17 LAB — HEMOGLOBIN A1C
HEMOGLOBIN A1C: 7.4 % — AB (ref <5.7)
Mean Plasma Glucose: 166 mg/dL

## 2016-06-18 ENCOUNTER — Encounter: Payer: Self-pay | Admitting: Internal Medicine

## 2016-06-18 ENCOUNTER — Ambulatory Visit (INDEPENDENT_AMBULATORY_CARE_PROVIDER_SITE_OTHER): Payer: Medicare Other | Admitting: Internal Medicine

## 2016-06-18 VITALS — BP 130/80 | Temp 97.9°F | Ht 63.0 in | Wt 207.0 lb

## 2016-06-18 DIAGNOSIS — E785 Hyperlipidemia, unspecified: Secondary | ICD-10-CM | POA: Diagnosis not present

## 2016-06-18 DIAGNOSIS — E669 Obesity, unspecified: Secondary | ICD-10-CM | POA: Diagnosis not present

## 2016-06-18 DIAGNOSIS — E119 Type 2 diabetes mellitus without complications: Secondary | ICD-10-CM

## 2016-06-18 DIAGNOSIS — I10 Essential (primary) hypertension: Secondary | ICD-10-CM | POA: Diagnosis not present

## 2016-06-18 MED ORDER — TRAMADOL HCL 50 MG PO TABS: 50.0000 mg | ORAL_TABLET | Freq: Three times a day (TID) | ORAL | Status: DC | PRN

## 2016-06-29 NOTE — Progress Notes (Signed)
   Subjective:    Patient ID: Nancy Wagner, female    DOB: 28-Jan-1951, 65 y.o.   MRN: [id number]  HPI six-month follow-up for this pleasant 65 year old black female with history of hypertension, hyperlipidemia, controlled type 2 diabetes mellitus. She had a fall recently and has not been getting relief with ibuprofen. Some pain in her buttock area.    Review of Systems     Objective:   Physical Exam Neck supple without JVD thyromegaly or carotid bruits. Chest clear. Cardiac exam regular rate and rhythm normal S1 and S2. Extremities without edema. Straight leg raising is negative bilaterally.       Assessment & Plan:  Essential hypertension-stable  Hyperlipidemia-stable and lipid panel liver functions are within normal limits  Controlled type 2 diabetes mellitus-hemoglobin A1c stable. A1c is 7.4% and previously was 7.8% 6 months ago  Musculoskeletal pain/sciatica  Plan: Tramadol 50 mg 1 by mouth 3 times a day when necessary pain. Return in 6 months for physical exam.

## 2016-06-29 NOTE — Patient Instructions (Signed)
Take tramadol as needed for pain. Continue same medications. Continue to work on diet exercise and weight loss. Return in 6 months

## 2016-07-21 ENCOUNTER — Other Ambulatory Visit: Payer: Self-pay | Admitting: Internal Medicine

## 2016-07-27 ENCOUNTER — Other Ambulatory Visit: Payer: Self-pay

## 2016-07-27 MED ORDER — SIMVASTATIN 20 MG PO TABS: 20.0000 mg | ORAL_TABLET | Freq: Every day | ORAL | 0 refills | Status: DC

## 2016-08-17 ENCOUNTER — Other Ambulatory Visit: Payer: Self-pay | Admitting: Internal Medicine

## 2016-08-26 ENCOUNTER — Ambulatory Visit (INDEPENDENT_AMBULATORY_CARE_PROVIDER_SITE_OTHER): Payer: Medicare Other | Admitting: Podiatry

## 2016-08-26 ENCOUNTER — Encounter: Payer: Self-pay | Admitting: Podiatry

## 2016-08-26 VITALS — BP 154/78 | HR 64

## 2016-08-26 DIAGNOSIS — L600 Ingrowing nail: Secondary | ICD-10-CM | POA: Diagnosis not present

## 2016-08-26 NOTE — Progress Notes (Signed)
   Subjective:    Patient ID: Nancy Wagner, female    DOB: Sep 26, 1951, 65 y.o.   MRN: [id number]  HPI    Review of Systems  Musculoskeletal: Positive for arthralgias.  All other systems reviewed and are negative.      Objective:   Physical Exam        Assessment & Plan:

## 2016-08-27 NOTE — Progress Notes (Signed)
Subjective:     Patient ID: Nancy Wagner, female   DOB: 01/19/1951, 65 y.o.   MRN: [id number]  HPI patient presents stating that she's having a lot of pain in her left hallux nail and that it's making it hard for her to wear shoe gear comfortably and it seems to be ingrown   Review of Systems  All other systems reviewed and are negative.      Objective:   Physical Exam  Constitutional: She is oriented to person, place, and time.  Cardiovascular: Intact distal pulses.   Musculoskeletal: Normal range of motion.  Neurological: She is oriented to person, place, and time.  Skin: Skin is warm.  Nursing note and vitals reviewed.  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 Patient understands procedure and risk and that a new nail will not regrow wanting procedure and at this time she went ahead and she had the hallux infiltrated 60 mg Xylocaine Marcaine mixture remove the hallux nail and applied phenol 5 applications 30 seconds followed by alcohol lavage and sterile dressing. Gave instructions on soaks and reappoint

## 2016-10-10 ENCOUNTER — Other Ambulatory Visit: Payer: Self-pay | Admitting: Internal Medicine

## 2016-10-28 ENCOUNTER — Other Ambulatory Visit: Payer: Self-pay | Admitting: Internal Medicine

## 2016-10-28 DIAGNOSIS — Z1231 Encounter for screening mammogram for malignant neoplasm of breast: Secondary | ICD-10-CM

## 2016-11-06 ENCOUNTER — Ambulatory Visit: Payer: Medicare Other

## 2016-11-09 ENCOUNTER — Other Ambulatory Visit: Payer: Self-pay | Admitting: Internal Medicine

## 2016-11-12 ENCOUNTER — Ambulatory Visit (INDEPENDENT_AMBULATORY_CARE_PROVIDER_SITE_OTHER): Payer: Medicare Other | Admitting: Internal Medicine

## 2016-11-12 ENCOUNTER — Encounter: Payer: Self-pay | Admitting: Internal Medicine

## 2016-11-12 VITALS — BP 162/82 | Ht 63.0 in | Wt 207.0 lb

## 2016-11-12 DIAGNOSIS — H6590 Unspecified nonsuppurative otitis media, unspecified ear: Secondary | ICD-10-CM

## 2016-11-12 DIAGNOSIS — H6503 Acute serous otitis media, bilateral: Secondary | ICD-10-CM

## 2016-11-12 MED ORDER — METHYLPREDNISOLONE ACETATE 80 MG/ML IJ SUSP
80.0000 mg | Freq: Once | INTRAMUSCULAR | Status: AC
  Administered 2016-11-12: 80 mg via INTRAMUSCULAR

## 2016-11-12 MED ORDER — AMOXICILLIN 500 MG PO CAPS: 500.0000 mg | ORAL_CAPSULE | Freq: Three times a day (TID) | ORAL | 0 refills | Status: DC

## 2016-11-12 NOTE — Progress Notes (Signed)
   Subjective:    Patient ID: Nancy Wagner, female    DOB: 03-Jan-1951, 65 y.o.   MRN: [id number]  HPI 65 year old Black Female in today with complaint of ear fullness for 3 weeks. Has had mild respiratory infection symptoms. No fever or shaking chills. Maybe slight cough. History of hypertension.    Review of Systems see above     Objective:   Physical Exam Both TMs are full bilaterally and pink. Pharynx is clear. Neck is supple without adenopathy. Chest clear to auscultation.       Assessment & Plan:  Acute bilateral serous otitis media  Plan: Depo-Medrol 80 mg IM. Amoxicillin 500 mg 3 times daily for 10 days.

## 2016-11-21 ENCOUNTER — Other Ambulatory Visit: Payer: Self-pay | Admitting: Internal Medicine

## 2016-11-29 NOTE — Patient Instructions (Signed)
Amoxicillin 500 mg 3 times daily for 10 days. Depo-Medrol 80 mg IM.

## 2016-12-08 ENCOUNTER — Ambulatory Visit
Admission: RE | Admit: 2016-12-08 | Discharge: 2016-12-08 | Disposition: A | Discharge status: Home / Self Care | Payer: Medicare Other | Source: Ambulatory Visit | Attending: Internal Medicine | Admitting: Internal Medicine

## 2016-12-08 DIAGNOSIS — Z1231 Encounter for screening mammogram for malignant neoplasm of breast: Secondary | ICD-10-CM

## 2016-12-11 ENCOUNTER — Other Ambulatory Visit: Payer: Medicare Other | Admitting: Internal Medicine

## 2016-12-14 ENCOUNTER — Encounter: Payer: Medicare Other | Admitting: Internal Medicine

## 2016-12-18 ENCOUNTER — Other Ambulatory Visit: Payer: Self-pay | Admitting: Internal Medicine

## 2017-01-18 ENCOUNTER — Other Ambulatory Visit: Payer: Self-pay

## 2017-01-18 MED ORDER — CLONIDINE HCL 0.3 MG PO TABS: 0.3000 mg | ORAL_TABLET | Freq: Every day | ORAL | 11 refills | Status: DC

## 2017-01-18 MED ORDER — ATENOLOL 100 MG PO TABS: 100.0000 mg | ORAL_TABLET | Freq: Every day | ORAL | 3 refills | Status: DC

## 2017-01-18 MED ORDER — TELMISARTAN-HCTZ 80-25 MG PO TABS: 1.0000 | ORAL_TABLET | Freq: Every day | ORAL | 11 refills | Status: DC

## 2017-02-08 ENCOUNTER — Other Ambulatory Visit: Payer: Medicare Other | Admitting: Internal Medicine

## 2017-02-08 DIAGNOSIS — Z1321 Encounter for screening for nutritional disorder: Secondary | ICD-10-CM

## 2017-02-08 DIAGNOSIS — Z0000 Encounter for general adult medical examination without abnormal findings: Secondary | ICD-10-CM

## 2017-02-08 DIAGNOSIS — Z1329 Encounter for screening for other suspected endocrine disorder: Secondary | ICD-10-CM

## 2017-02-08 DIAGNOSIS — I10 Essential (primary) hypertension: Secondary | ICD-10-CM

## 2017-02-08 DIAGNOSIS — E119 Type 2 diabetes mellitus without complications: Secondary | ICD-10-CM

## 2017-02-08 DIAGNOSIS — Z13220 Encounter for screening for lipoid disorders: Secondary | ICD-10-CM

## 2017-02-08 LAB — LIPID PANEL
CHOL/HDL RATIO: 2.7 ratio (ref <5.0)
Cholesterol: 165 mg/dL (ref <200)
HDL: 61 mg/dL (ref >50)
LDL CALC: 86 mg/dL (ref <100)
Triglycerides: 92 mg/dL (ref <150)
VLDL: 18 mg/dL (ref <30)

## 2017-02-08 LAB — COMPREHENSIVE METABOLIC PANEL
ALK PHOS: 44 U/L (ref 33–130)
ALT: 10 U/L (ref 6–29)
AST: 12 U/L (ref 10–35)
Albumin: 3.8 g/dL (ref 3.6–5.1)
BUN: 10 mg/dL (ref 7–25)
CO2: 30 mmol/L (ref 20–31)
CREATININE: 0.93 mg/dL (ref 0.50–0.99)
Calcium: 9.3 mg/dL (ref 8.6–10.4)
Chloride: 101 mmol/L (ref 98–110)
Glucose, Bld: 130 mg/dL — ABNORMAL HIGH (ref 65–99)
Potassium: 3.8 mmol/L (ref 3.5–5.3)
SODIUM: 141 mmol/L (ref 135–146)
TOTAL PROTEIN: 7.7 g/dL (ref 6.1–8.1)
Total Bilirubin: 0.6 mg/dL (ref 0.2–1.2)

## 2017-02-08 LAB — CBC WITH DIFFERENTIAL/PLATELET
BASOS ABS: 0 {cells}/uL (ref 0–200)
Basophils Relative: 0 %
Eosinophils Absolute: 219 cells/uL (ref 15–500)
Eosinophils Relative: 3 %
HEMATOCRIT: 39.1 % (ref 35.0–45.0)
HEMOGLOBIN: 12.7 g/dL (ref 11.7–15.5)
LYMPHS ABS: 3212 {cells}/uL (ref 850–3900)
LYMPHS PCT: 44 %
MCH: 30.1 pg (ref 27.0–33.0)
MCHC: 32.5 g/dL (ref 32.0–36.0)
MCV: 92.7 fL (ref 80.0–100.0)
MONO ABS: 657 {cells}/uL (ref 200–950)
MPV: 11.6 fL (ref 7.5–12.5)
Monocytes Relative: 9 %
NEUTROS PCT: 44 %
Neutro Abs: 3212 cells/uL (ref 1500–7800)
PLATELETS: 255 10*3/uL (ref 140–400)
RBC: 4.22 MIL/uL (ref 3.80–5.10)
RDW: 14.2 % (ref 11.0–15.0)
WBC: 7.3 10*3/uL (ref 3.8–10.8)

## 2017-02-08 LAB — TSH: TSH: 1.05 m[IU]/L

## 2017-02-09 LAB — MICROALBUMIN / CREATININE URINE RATIO

## 2017-02-09 LAB — HEMOGLOBIN A1C
HEMOGLOBIN A1C: 7.7 % — AB (ref <5.7)
Mean Plasma Glucose: 174 mg/dL

## 2017-02-09 LAB — VITAMIN D 25 HYDROXY (VIT D DEFICIENCY, FRACTURES): Vit D, 25-Hydroxy: 34 ng/mL (ref 30–100)

## 2017-02-11 ENCOUNTER — Telehealth: Payer: Self-pay

## 2017-02-11 ENCOUNTER — Encounter: Payer: Self-pay | Admitting: Internal Medicine

## 2017-02-11 ENCOUNTER — Ambulatory Visit (INDEPENDENT_AMBULATORY_CARE_PROVIDER_SITE_OTHER): Payer: Medicare Other | Admitting: Internal Medicine

## 2017-02-11 VITALS — BP 142/72 | HR 69 | Temp 98.6°F | Ht 63.0 in | Wt 206.5 lb

## 2017-02-11 DIAGNOSIS — Z0000 Encounter for general adult medical examination without abnormal findings: Secondary | ICD-10-CM

## 2017-02-11 DIAGNOSIS — E8881 Metabolic syndrome: Secondary | ICD-10-CM | POA: Diagnosis not present

## 2017-02-11 DIAGNOSIS — Z1211 Encounter for screening for malignant neoplasm of colon: Secondary | ICD-10-CM | POA: Diagnosis not present

## 2017-02-11 DIAGNOSIS — E784 Other hyperlipidemia: Secondary | ICD-10-CM | POA: Diagnosis not present

## 2017-02-11 DIAGNOSIS — E7849 Other hyperlipidemia: Secondary | ICD-10-CM

## 2017-02-11 DIAGNOSIS — I10 Essential (primary) hypertension: Secondary | ICD-10-CM

## 2017-02-11 DIAGNOSIS — E119 Type 2 diabetes mellitus without complications: Secondary | ICD-10-CM

## 2017-02-11 DIAGNOSIS — Z6836 Body mass index (BMI) 36.0-36.9, adult: Secondary | ICD-10-CM | POA: Diagnosis not present

## 2017-02-11 DIAGNOSIS — R8290 Unspecified abnormal findings in urine: Secondary | ICD-10-CM

## 2017-02-11 LAB — POCT URINALYSIS DIPSTICK
Bilirubin, UA: NEGATIVE
Blood, UA: NEGATIVE
Glucose, UA: NEGATIVE
KETONES UA: NEGATIVE
NITRITE UA: NEGATIVE
PROTEIN UA: NEGATIVE
Spec Grav, UA: 1.015
Urobilinogen, UA: 0.2
pH, UA: 6

## 2017-02-11 MED ORDER — AMLODIPINE BESYLATE 5 MG PO TABS: 5.0000 mg | ORAL_TABLET | Freq: Every day | ORAL | 0 refills | Status: DC

## 2017-02-11 NOTE — Progress Notes (Signed)
Subjective:    Patient ID: Nancy Wagner, female    DOB: Feb 22, 1951, 66 y.o.   MRN: [id number]  HPI 66 year old Black Female for health maintenance exam and evaluation of medical issues.Needs colonoscopy. Says gastroenterologist has retired. Will make referral for her.  Patient remains overweight. BMI is 36.58. Blood pressure 142/72. She has difficult to control hypertension requiring Tenormin, 2 doses of clonidine which I have been reluctant to change due to rebound hypertension when stopping clonidine. She has a history of controlled type 2 diabetes mellitus and hyperlipidemia. Metabolic syndrome. Osteoarthritis of knee.  Had welcome to Medicare exam January 2017.  She was referred by Juanetta Snow. She and Tharon Aquas worked together at [employer] where patient was employed as a Investment banker, corporate. Patient is now retired.  She is followed by Dr. Satira Sark ophthalmologist for diabetic eye exam.  At one point she had an infection in her right eye and was seen by Dr. Tomi Likens at [facility]. She was on prednisone for some time. Doesn't know what type of infection she had but said it was not shingles. We do not have those records.  Pneumonia 1977. Throat surgery by Dr. Ernesto Rutherford in 1985. 2 C-sections in [place]. Tubal ligation 1981. Cholecystectomy in the early 1980s.  She is intolerant of codeine it causes rash and hives.  Social history: She does not smoke or consume alcohol. She has 2 adult children son and a daughter in good health. She is married. Husband formerly worked as a Land but is now Brewing technologist at [employer]. She now has her granddaughter living with her as apparently she was not getting along with her mother in [place]. She goes to the gym a couple of times a week.  In 2012 she had her left knee injected with steroids for osteoarthritis.  Previously seen by Dr. Rodman Pickle with history of labile hypertension.  Family history: Father in his  late 73s and mother in her early 99s in good health. 4 brothers. One brother died recently of complications of cancer. 4 sisters-3 of whom are in good health but wanted her late 54s with history of MI and other health issues.      Review of Systems  Constitutional: Positive for fatigue.  Eyes: Negative.   Respiratory: Negative.   Cardiovascular: Negative.   Gastrointestinal: Negative.   Genitourinary: Negative.   Neurological: Negative.   Psychiatric/Behavioral:       Grief reaction due to death in family       Objective:   Physical Exam        Assessment & Plan:  Controlled type 2 diabetes mellitus. Hemoglobin A1c 7.7% and previously was 7.4%. Is on Prandin and Glucophage. May need to rethink diabetic control at next visit.  Hyperlipidemia-normal lipid panel  Difficult to control hypertension-she is on clonidine, micardis- HCTZ 80/25, Tenormin 100 mg daily, amlodipine 5 mg daily. Reevaluate in 4 weeks. Just add in amlodipine. Might want to consider Lasix instead of HCTZ and we made need to change ARB to something stronger  Possible UTI-2+ LE on urine dipstick. Culture pending.  Osteoarthritis left knee.  Obesity  Metabolic syndrome  Plan: Continue Zocor 20 mg daily. Referral to GI for colonoscopy. Continue Tenormin 100 mg daily. Add amlodipine 5 mg daily. Currently taking clonidine 0.3 mg at bedtime and 0.1 mg every morning.  Subjective:   Patient presents for Medicare Annual/Subsequent preventive examination.  Review Past Medical/Family/Social:See above   Risk Factors  Current exercise  habits: Goes to gym a couple times a week Dietary issues discussed: Low fat low carbohydrate  Cardiac risk factors:Family history of stroke in brother, diabetes mellitus, hypertension hyperlipidemia  Depression Screen  (Note: if answer to either of the following is "Yes", a more complete depression screening is indicated)   Over the past two weeks, have you felt down,  depressed or hopeless? No  Over the past two weeks, have you felt little interest or pleasure in doing things? No Have you lost interest or pleasure in daily life? No Do you often feel hopeless? No Do you cry easily over simple problems? No   Activities of Daily Living  In your present state of health, do you have any difficulty performing the following activities?:   Driving? No  Managing money? No  Feeding yourself? No  Getting from bed to chair? No  Climbing a flight of stairs? No  Preparing food and eating?: No  Bathing or showering? No  Getting dressed: No  Getting to the toilet? No  Using the toilet:No  Moving around from place to place: No  In the past year have you fallen or had a near fall?:No  Are you sexually active? No  Do you have more than one partner? No   Hearing Difficulties: No  Do you often ask people to speak up or repeat themselves? No  Do you experience ringing or noises in your ears? No  Do you have difficulty understanding soft or whispered voices? No  Do you feel that you have a problem with memory? No Do you often misplace items? No    Home Safety:  Do you have a smoke alarm at your residence? Yes Do you have grab bars in the bathroom?Yes Do you have throw rugs in your house? Yes   Cognitive Testing  Alert? Yes Normal Appearance?Yes  Oriented to person? Yes Place? Yes  Time? Yes  Recall of three objects? Yes  Can perform simple calculations? Yes  Displays appropriate judgment?Yes  Can read the correct time from a watch face?Yes   List the Names of Other Physician/Practitioners you currently use:  See referral list for the physicians patient is currently seeing.     Review of Systems: See above   Objective:     General appearance: Appears stated age and  obese  Head: Normocephalic, without obvious abnormality, atraumatic  Eyes: conj clear, EOMi PEERLA  Ears: normal TM's and external ear canals both ears  Nose: Nares normal. Septum  midline. Mucosa normal. No drainage or sinus tenderness.  Throat: lips, mucosa, and tongue normal; teeth and gums normal  Neck: no adenopathy, no carotid bruit, no JVD, supple, symmetrical, trachea midline and thyroid not enlarged, symmetric, no tenderness/mass/nodules  No CVA tenderness.  Lungs: clear to auscultation bilaterally  Breasts: normal appearance, no masses or tenderness, top of the pacemaker on left upper chest. Incision well-healed. It is tender.  Heart: regular rate and rhythm, S1, S2 normal, no murmur, click, rub or gallop  Abdomen: soft, non-tender; bowel sounds normal; no masses, no organomegaly  Musculoskeletal: ROM normal in all joints, no crepitus, no deformity, Normal muscle strengthen. Back  is symmetric, no curvature. Skin: Skin color, texture, turgor normal. No rashes or lesions  Lymph nodes: Cervical, supraclavicular, and axillary nodes normal.  Neurologic: CN 2 -12 Normal, Normal symmetric reflexes. Normal coordination and gait  Psych: Alert & Oriented x 3, Mood appear stable.    Assessment:    Annual wellness medicare exam   Plan:  During the course of the visit the patient was educated and counseled about appropriate screening and preventive services including:        Patient Instructions (the written plan) was given to the patient.  Medicare Attestation  I have personally reviewed:  The patient's medical and social history  Their use of alcohol, tobacco or illicit drugs  Their current medications and supplements  The patient's functional ability including ADLs,fall risks, home safety risks, cognitive, and hearing and visual impairment  Diet and physical activities  Evidence for depression or mood disorders  The patient's weight, height, BMI, and visual acuity have been recorded in the chart. I have made referrals, counseling, and provided education to the patient based on review of the above and I have provided the patient with a written  personalized care plan for preventive services.

## 2017-02-11 NOTE — Telephone Encounter (Signed)
Referral to GI was sent.

## 2017-02-13 LAB — URINE CULTURE

## 2017-02-17 LAB — HM DIABETES EYE EXAM

## 2017-02-18 ENCOUNTER — Encounter: Payer: Self-pay | Admitting: Internal Medicine

## 2017-02-26 ENCOUNTER — Other Ambulatory Visit: Payer: Self-pay | Admitting: Internal Medicine

## 2017-02-26 NOTE — Patient Instructions (Addendum)
Norvasc has been added to [place] antihypertensive regimen. Please try to diet and exercise. Return in 4 weeks. Urine culture pending.

## 2017-03-04 ENCOUNTER — Ambulatory Visit (INDEPENDENT_AMBULATORY_CARE_PROVIDER_SITE_OTHER): Payer: Medicare Other | Admitting: Internal Medicine

## 2017-03-04 ENCOUNTER — Encounter: Payer: Self-pay | Admitting: Internal Medicine

## 2017-03-04 VITALS — BP 130/68 | HR 62 | Temp 98.3°F | Wt 208.0 lb

## 2017-03-04 DIAGNOSIS — F439 Reaction to severe stress, unspecified: Secondary | ICD-10-CM | POA: Diagnosis not present

## 2017-03-04 DIAGNOSIS — I10 Essential (primary) hypertension: Secondary | ICD-10-CM

## 2017-03-04 NOTE — Progress Notes (Signed)
   Subjective:    Patient ID: Nancy Wagner, female    DOB: July 18, 1951, 66 y.o.   MRN: [id number]  HPI 66 year old 77 female for follow up of Hypertension. At last visit, we added Norvasc 5 mg daily. She was taking clonidine 0.3 mg at bedtime and 0.1 mg every morning. She was on Tenormin 100 mg daily.  She is scheduled for a D&C by Dr. Biagio Quint May 9.  Her blood pressure today is good at 130/68 and she feels okay.  Having issues with granddaughter who is residing with her. Discussed today at length.  Review of Systems see above     Objective:   Physical Exam  Neck supple without JVD thyromegaly or carotid bruits. Chest clear. Cardiac exam regular rate and rhythm normal S1 and S2. Extremity is without edema      Assessment & Plan:  HTN  Take Norvasc in am and d/c Clonidine 0.1 mg q am. RTC in 4 weeks.Continue clonidine at bedtime

## 2017-03-25 ENCOUNTER — Other Ambulatory Visit: Payer: Self-pay | Admitting: Obstetrics and Gynecology

## 2017-03-26 ENCOUNTER — Encounter (HOSPITAL_BASED_OUTPATIENT_CLINIC_OR_DEPARTMENT_OTHER): Payer: Self-pay | Admitting: *Deleted

## 2017-03-27 NOTE — Patient Instructions (Signed)
Continue amlodipine and take every morning. Discontinue clonidine every morning and continue clonidine at bedtime. Return in 4 weeks.

## 2017-03-29 ENCOUNTER — Encounter (HOSPITAL_BASED_OUTPATIENT_CLINIC_OR_DEPARTMENT_OTHER): Payer: Self-pay | Admitting: *Deleted

## 2017-03-29 NOTE — Progress Notes (Signed)
To [place] at 0700- CBC,BMET-will be drawn at [facility] prior to 04/07/17 -states understands.Ekg on arrival. Npo after Mn-will take atenolol,amlodipine with water in am-will bring albuterol inhaler.

## 2017-04-01 ENCOUNTER — Encounter: Payer: Self-pay | Admitting: Internal Medicine

## 2017-04-01 ENCOUNTER — Ambulatory Visit (INDEPENDENT_AMBULATORY_CARE_PROVIDER_SITE_OTHER): Payer: Medicare Other | Admitting: Internal Medicine

## 2017-04-01 VITALS — BP 140/78 | HR 72 | Temp 98.8°F | Ht 63.0 in | Wt 208.0 lb

## 2017-04-01 DIAGNOSIS — F439 Reaction to severe stress, unspecified: Secondary | ICD-10-CM | POA: Diagnosis not present

## 2017-04-01 DIAGNOSIS — I10 Essential (primary) hypertension: Secondary | ICD-10-CM | POA: Diagnosis not present

## 2017-04-01 MED ORDER — AMLODIPINE BESYLATE 5 MG PO TABS: 5.0000 mg | ORAL_TABLET | Freq: Every day | ORAL | 1 refills | Status: DC

## 2017-04-04 DIAGNOSIS — R935 Abnormal findings on diagnostic imaging of other abdominal regions, including retroperitoneum: Secondary | ICD-10-CM

## 2017-04-04 DIAGNOSIS — N950 Postmenopausal bleeding: Secondary | ICD-10-CM | POA: Diagnosis not present

## 2017-04-04 DIAGNOSIS — R9389 Abnormal findings on diagnostic imaging of other specified body structures: Secondary | ICD-10-CM | POA: Diagnosis present

## 2017-04-04 NOTE — H&P (Signed)
Nancy Wagner is an 66 y.o. female who presents for evaluation of an abnormal ultrasound with thickenend endometrium done in workup of a single episode of postmenopausal bleeding  Pertinent Gynecological History: Menses: post-menopausal Bleeding: post menopausal bleeding Contraception: post menopausal status DES exposure: unknown Blood transfusions: none Sexually transmitted diseases: no past history Previous GYN Procedures: DNC  Last mammogram: normal Date: 2108 Last pap: normal Date: 2015:  Normal pap.  Neg HR HPV OB History: G3, P2   Menstrual History: Menarche age: 25 No LMP recorded. Patient is postmenopausal.    Past Medical History:  Diagnosis Date  . Asthma    controlled  . Cervical stenosis (uterine cervix)   . GERD (gastroesophageal reflux disease)    diet control  . Hypertension   . Lichen simplex chronicus   . OA (osteoarthritis)   . PMB (postmenopausal bleeding)   . Type 2 diabetes mellitus ([place])   . Uterine fibroid     Past Surgical History:  Procedure Laterality Date  . [place]  . CHOLECYSTECTOMY  1984  . THROAT SURGERY  1985   minimal uvulopalatopharyngoplasty for sleep apnea  . TUBAL LIGATION Bilateral 1981    Family History  Problem Relation Age of Onset  . Heart disease Sister     Social History:  reports that she has never smoked. She has never used smokeless tobacco. She reports that she drinks about 0.6 oz of alcohol per week . She reports that she does not use drugs.  Allergies:  Allergies  Allergen Reactions  . Adhesive [Tape] Rash    Band-Aid  . Codeine Hives, Itching and Rash    No prescriptions prior to admission.    Review of Systems  Constitutional: Negative.   HENT: Negative.   Eyes: Negative.        Wears glasses  Respiratory: Negative.   Cardiovascular: Negative.   Genitourinary: Negative.   Skin: Negative.   Endo/Heme/Allergies: Negative.   Psychiatric/Behavioral: Negative.     There were  no vitals taken for this visit. Physical Exam  Constitutional: She is oriented to person, place, and time. She appears well-developed and well-nourished.  HENT:  Head: Normocephalic and atraumatic.  Eyes: EOM are normal.  Neck: Normal range of motion. Neck supple.  Cardiovascular: Normal rate and regular rhythm.   Respiratory: Effort normal and breath sounds normal.  GI: Soft. Bowel sounds are normal.  Exam limited by body habitus  Genitourinary:  Genitourinary Comments: Pelvic exam: normal external genitalia, vulva, vagina, cervix, uterus and adnexa. Exam limited by body habitus  Musculoskeletal: Normal range of motion.  Neurological: She is alert and oriented to person, place, and time.  Skin: Skin is warm and dry.  Psychiatric: She has a normal mood and affect.    No results found for this or any previous visit (from the past 24 hour(s)).  No results found.  Assessment/Plan: Single episode post menopausal bleeding Thickened endometrium on ultrasound Cervical stenosis precluding  Office endometrial sampling.  Recommendation: Hysteroscopy with endometrial sampling, resection if needed and curettage recommended and accepted.  Indications, risks and benefits reviewed. The risks of anesthesia, bleeding, infection and damage to adjacent organs reviewed as well as the particular risk uterine perforation.  Pt wishes to proceed  Lamonda Noxon P 04/06/2017, 6:08 PM

## 2017-04-05 DIAGNOSIS — N950 Postmenopausal bleeding: Secondary | ICD-10-CM | POA: Diagnosis not present

## 2017-04-05 DIAGNOSIS — M4802 Spinal stenosis, cervical region: Secondary | ICD-10-CM | POA: Diagnosis not present

## 2017-04-05 DIAGNOSIS — N840 Polyp of corpus uteri: Secondary | ICD-10-CM | POA: Diagnosis not present

## 2017-04-05 DIAGNOSIS — E119 Type 2 diabetes mellitus without complications: Secondary | ICD-10-CM | POA: Diagnosis not present

## 2017-04-05 DIAGNOSIS — I10 Essential (primary) hypertension: Secondary | ICD-10-CM | POA: Diagnosis not present

## 2017-04-05 DIAGNOSIS — R938 Abnormal findings on diagnostic imaging of other specified body structures: Secondary | ICD-10-CM | POA: Diagnosis not present

## 2017-04-05 LAB — BASIC METABOLIC PANEL
Anion gap: 9 (ref 5–15)
BUN: 13 mg/dL (ref 6–20)
CALCIUM: 9.3 mg/dL (ref 8.9–10.3)
CO2: 29 mmol/L (ref 22–32)
CREATININE: 0.94 mg/dL (ref 0.44–1.00)
Chloride: 101 mmol/L (ref 101–111)
Glucose, Bld: 109 mg/dL — ABNORMAL HIGH (ref 65–99)
Potassium: 3.5 mmol/L (ref 3.5–5.1)
SODIUM: 139 mmol/L (ref 135–145)

## 2017-04-05 LAB — CBC
HCT: 39.4 % (ref 36.0–46.0)
Hemoglobin: 12.8 g/dL (ref 12.0–15.0)
MCH: 30.1 pg (ref 26.0–34.0)
MCHC: 32.5 g/dL (ref 30.0–36.0)
MCV: 92.7 fL (ref 78.0–100.0)
PLATELETS: 253 10*3/uL (ref 150–400)
RBC: 4.25 MIL/uL (ref 3.87–5.11)
RDW: 13.2 % (ref 11.5–15.5)
WBC: 9.7 10*3/uL (ref 4.0–10.5)

## 2017-04-06 NOTE — Anesthesia Preprocedure Evaluation (Signed)
Anesthesia Evaluation  Patient identified by MRN, date of birth, ID band Patient awake    Reviewed: Allergy & Precautions, NPO status , Patient's Chart, lab work & pertinent test results  Airway Mallampati: II  TM Distance: >3 FB Neck ROM: Full    Dental no notable dental hx.    Pulmonary asthma ,    Pulmonary exam normal breath sounds clear to auscultation       Cardiovascular hypertension, Pt. on medications and Pt. on home beta blockers Normal cardiovascular exam Rhythm:Regular Rate:Normal     Neuro/Psych negative neurological ROS  negative psych ROS   GI/Hepatic negative GI ROS, Neg liver ROS,   Endo/Other  diabetes  Renal/GU negative Renal ROS  negative genitourinary   Musculoskeletal negative musculoskeletal ROS (+)   Abdominal   Peds negative pediatric ROS (+)  Hematology negative hematology ROS (+)   Anesthesia Other Findings   Reproductive/Obstetrics negative OB ROS                             Anesthesia Physical Anesthesia Plan  ASA: III  Anesthesia Plan: General   Post-op Pain Management:    Induction: Intravenous  Airway Management Planned: LMA  Additional Equipment:   Intra-op Plan:   Post-operative Plan: Extubation in OR  Informed Consent: I have reviewed the patients History and Physical, chart, labs and discussed the procedure including the risks, benefits and alternatives for the proposed anesthesia with the patient or authorized representative who has indicated his/her understanding and acceptance.   Dental advisory given  Plan Discussed with: CRNA and Surgeon  Anesthesia Plan Comments:         Anesthesia Quick Evaluation

## 2017-04-06 NOTE — Patient Instructions (Addendum)
Please take Norvasc before coming for your next appointment as well as all of your medications. We will discuss further management at that time. Next appointment is May 24

## 2017-04-06 NOTE — Progress Notes (Signed)
   Subjective:    Patient ID: Nancy Wagner, female    DOB: [birth year], 66 y.o.   MRN: [id number]  HPI Unfortunately she ran out of Norvasc and did not refill it. Her blood pressure is elevated today at 152/78. She thinks that clonidine is causing her to have a irritated scalp. She wants to stop taking it but she just can't stop it abruptly. It will need to be tapered. It works well for her. She doesn't like the 0.3 mg tablet at bedtime but she cannot exactly tell me why. She also takes atenolol 100 mg daily, Micardis/ HCT 80/25 daily, Tenormin 100 mg daily. This is in addition to Norvasc 5 mg daily. She will come back when she has taken her Norvasc.    Review of Systems see above     Objective:   Physical Exam Chest clear. Cardiac exam regular rate and rhythm normal S1 and S2. Extremities without edema.       Assessment & Plan:  Labile hypertension  Plan: I think in the future we can consider changing my card [place] to Benicar. Perhaps she could take Lasix instead of HCTZ. We could try to taper clonidine.

## 2017-04-07 ENCOUNTER — Ambulatory Visit (HOSPITAL_BASED_OUTPATIENT_CLINIC_OR_DEPARTMENT_OTHER): Payer: Medicare Other | Admitting: Anesthesiology

## 2017-04-07 ENCOUNTER — Ambulatory Visit (HOSPITAL_BASED_OUTPATIENT_CLINIC_OR_DEPARTMENT_OTHER)
Admission: RE | Admit: 2017-04-07 | Discharge: 2017-04-07 | Disposition: A | Discharge status: Home / Self Care | Payer: Medicare Other | Source: Ambulatory Visit | Attending: Obstetrics and Gynecology | Admitting: Obstetrics and Gynecology

## 2017-04-07 ENCOUNTER — Encounter (HOSPITAL_BASED_OUTPATIENT_CLINIC_OR_DEPARTMENT_OTHER)
Admission: RE | Disposition: A | Discharge status: Home / Self Care | Payer: Self-pay | Source: Ambulatory Visit | Attending: Obstetrics and Gynecology

## 2017-04-07 ENCOUNTER — Encounter (HOSPITAL_BASED_OUTPATIENT_CLINIC_OR_DEPARTMENT_OTHER): Payer: Self-pay | Admitting: *Deleted

## 2017-04-07 DIAGNOSIS — M4802 Spinal stenosis, cervical region: Secondary | ICD-10-CM | POA: Diagnosis not present

## 2017-04-07 DIAGNOSIS — R935 Abnormal findings on diagnostic imaging of other abdominal regions, including retroperitoneum: Secondary | ICD-10-CM

## 2017-04-07 DIAGNOSIS — R938 Abnormal findings on diagnostic imaging of other specified body structures: Secondary | ICD-10-CM | POA: Diagnosis not present

## 2017-04-07 DIAGNOSIS — I10 Essential (primary) hypertension: Secondary | ICD-10-CM

## 2017-04-07 DIAGNOSIS — N840 Polyp of corpus uteri: Secondary | ICD-10-CM | POA: Insufficient documentation

## 2017-04-07 DIAGNOSIS — E1169 Type 2 diabetes mellitus with other specified complication: Secondary | ICD-10-CM

## 2017-04-07 DIAGNOSIS — N951 Menopausal and female climacteric states: Secondary | ICD-10-CM

## 2017-04-07 DIAGNOSIS — N950 Postmenopausal bleeding: Secondary | ICD-10-CM | POA: Diagnosis not present

## 2017-04-07 DIAGNOSIS — E669 Obesity, unspecified: Secondary | ICD-10-CM

## 2017-04-07 DIAGNOSIS — E119 Type 2 diabetes mellitus without complications: Secondary | ICD-10-CM | POA: Insufficient documentation

## 2017-04-07 DIAGNOSIS — R9389 Abnormal findings on diagnostic imaging of other specified body structures: Secondary | ICD-10-CM | POA: Diagnosis present

## 2017-04-07 HISTORY — DX: Leiomyoma of uterus, unspecified: D25.9

## 2017-04-07 HISTORY — DX: Gastro-esophageal reflux disease without esophagitis: K21.9

## 2017-04-07 HISTORY — DX: Lichen simplex chronicus: L28.0

## 2017-04-07 HISTORY — DX: Postmenopausal bleeding: N95.0

## 2017-04-07 HISTORY — PX: DILATATION & CURRETTAGE/HYSTEROSCOPY WITH RESECTOCOPE: SHX5572

## 2017-04-07 HISTORY — DX: Unspecified osteoarthritis, unspecified site: M19.90

## 2017-04-07 HISTORY — DX: Unspecified asthma, uncomplicated: J45.909

## 2017-04-07 HISTORY — DX: Type 2 diabetes mellitus without complications: E11.9

## 2017-04-07 HISTORY — DX: Stricture and stenosis of cervix uteri: N88.2

## 2017-04-07 LAB — GLUCOSE, CAPILLARY
GLUCOSE-CAPILLARY: 104 mg/dL — AB (ref 65–99)
Glucose-Capillary: 147 mg/dL — ABNORMAL HIGH (ref 65–99)

## 2017-04-07 SURGERY — DILATATION & CURETTAGE/HYSTEROSCOPY WITH RESECTOCOPE
Anesthesia: General

## 2017-04-07 MED ORDER — MIDAZOLAM HCL 2 MG/2ML IJ SOLN
INTRAMUSCULAR | Status: AC
  Filled 2017-04-07: qty 2

## 2017-04-07 MED ORDER — PROMETHAZINE HCL 25 MG/ML IJ SOLN
6.2500 mg | INTRAMUSCULAR | Status: DC | PRN
  Filled 2017-04-07: qty 1

## 2017-04-07 MED ORDER — LIDOCAINE 2% (20 MG/ML) 5 ML SYRINGE
INTRAMUSCULAR | Status: AC
  Filled 2017-04-07: qty 5

## 2017-04-07 MED ORDER — ONDANSETRON HCL 4 MG/2ML IJ SOLN
INTRAMUSCULAR | Status: DC | PRN
  Administered 2017-04-07: 4 mg via INTRAVENOUS

## 2017-04-07 MED ORDER — FENTANYL CITRATE (PF) 100 MCG/2ML IJ SOLN
25.0000 ug | INTRAMUSCULAR | Status: DC | PRN
  Filled 2017-04-07: qty 1

## 2017-04-07 MED ORDER — DEXAMETHASONE SODIUM PHOSPHATE 4 MG/ML IJ SOLN
INTRAMUSCULAR | Status: DC | PRN
  Administered 2017-04-07: 8 mg via INTRAVENOUS

## 2017-04-07 MED ORDER — PROPOFOL 10 MG/ML IV BOLUS
INTRAVENOUS | Status: AC
  Filled 2017-04-07: qty 40

## 2017-04-07 MED ORDER — EPHEDRINE 5 MG/ML INJ
INTRAVENOUS | Status: AC
  Filled 2017-04-07: qty 10

## 2017-04-07 MED ORDER — ONDANSETRON HCL 4 MG/2ML IJ SOLN
INTRAMUSCULAR | Status: AC
  Filled 2017-04-07: qty 2

## 2017-04-07 MED ORDER — KETOROLAC TROMETHAMINE 30 MG/ML IJ SOLN
INTRAMUSCULAR | Status: AC
  Filled 2017-04-07: qty 1

## 2017-04-07 MED ORDER — DEXAMETHASONE SODIUM PHOSPHATE 10 MG/ML IJ SOLN
INTRAMUSCULAR | Status: AC
  Filled 2017-04-07: qty 1

## 2017-04-07 MED ORDER — KETOROLAC TROMETHAMINE 15 MG/ML IJ SOLN
INTRAMUSCULAR | Status: DC | PRN
  Administered 2017-04-07: 15 mg via INTRAMUSCULAR
  Administered 2017-04-07: 15 mg via INTRAVENOUS

## 2017-04-07 MED ORDER — ACETAMINOPHEN 10 MG/ML IV SOLN
1000.0000 mg | Freq: Once | INTRAVENOUS | Status: DC | PRN
Start: 2017-04-07 — End: 2017-04-07
  Filled 2017-04-07: qty 100

## 2017-04-07 MED ORDER — MIDAZOLAM HCL 5 MG/5ML IJ SOLN
INTRAMUSCULAR | Status: DC | PRN
  Administered 2017-04-07 (×2): 1 mg via INTRAVENOUS

## 2017-04-07 MED ORDER — ARTIFICIAL TEARS OPHTHALMIC OINT
TOPICAL_OINTMENT | OPHTHALMIC | Status: AC
  Filled 2017-04-07: qty 3.5

## 2017-04-07 MED ORDER — LIDOCAINE 2% (20 MG/ML) 5 ML SYRINGE
INTRAMUSCULAR | Status: DC | PRN
  Administered 2017-04-07: 70 mg via INTRAVENOUS

## 2017-04-07 MED ORDER — IBUPROFEN 600 MG PO TABS: ORAL_TABLET | ORAL | 0 refills | Status: DC

## 2017-04-07 MED ORDER — PROPOFOL 10 MG/ML IV BOLUS
INTRAVENOUS | Status: DC | PRN
  Administered 2017-04-07: 150 mg via INTRAVENOUS

## 2017-04-07 MED ORDER — LACTATED RINGERS IV SOLN
INTRAVENOUS | Status: DC
  Administered 2017-04-07 (×3): via INTRAVENOUS
  Filled 2017-04-07: qty 1000

## 2017-04-07 MED ORDER — LIDOCAINE HCL 2 % IJ SOLN
INTRAMUSCULAR | Status: DC | PRN
  Administered 2017-04-07: 10 mL

## 2017-04-07 MED ORDER — EPHEDRINE SULFATE-NACL 50-0.9 MG/10ML-% IV SOSY
PREFILLED_SYRINGE | INTRAVENOUS | Status: DC | PRN
  Administered 2017-04-07: 15 mg via INTRAVENOUS

## 2017-04-07 MED ORDER — FENTANYL CITRATE (PF) 100 MCG/2ML IJ SOLN
INTRAMUSCULAR | Status: DC | PRN
  Administered 2017-04-07 (×2): 25 ug via INTRAVENOUS

## 2017-04-07 MED ORDER — FENTANYL CITRATE (PF) 100 MCG/2ML IJ SOLN
INTRAMUSCULAR | Status: AC
  Filled 2017-04-07: qty 2

## 2017-04-07 SURGICAL SUPPLY — 23 items
BIPOLAR CUTTING LOOP 21FR (ELECTRODE)
BOOTIES KNEE HIGH SLOAN (MISCELLANEOUS) ×3 IMPLANT
CANISTER SUCT 3000ML PPV (MISCELLANEOUS) ×3 IMPLANT
CATH ROBINSON RED A/P 16FR (CATHETERS) ×3 IMPLANT
COVER BACK TABLE 60X90IN (DRAPES) ×3 IMPLANT
DILATOR CANAL MILEX (MISCELLANEOUS) IMPLANT
DRAPE HYSTEROSCOPY (DRAPE) ×3 IMPLANT
DRAPE LG THREE QUARTER DISP (DRAPES) ×3 IMPLANT
GLOVE SURG SS PI 6.5 STRL IVOR (GLOVE) ×3 IMPLANT
GOWN STRL REUS W/TWL LRG LVL3 (GOWN DISPOSABLE) ×6 IMPLANT
KIT RM TURNOVER CYSTO AR (KITS) ×3 IMPLANT
LEGGING LITHOTOMY PAIR STRL (DRAPES) ×3 IMPLANT
LOOP CUTTING BIPOLAR 21FR (ELECTRODE) IMPLANT
NDL SPNL 22GX3.5 QUINCKE BK (NEEDLE) ×1 IMPLANT
NEEDLE SPNL 22GX3.5 QUINCKE BK (NEEDLE) ×3 IMPLANT
PACK BASIN DAY SURGERY FS (CUSTOM PROCEDURE TRAY) ×3 IMPLANT
PAD OB MATERNITY 4.3X12.25 (PERSONAL CARE ITEMS) ×3 IMPLANT
SYR CONTROL 10ML LL (SYRINGE) ×3 IMPLANT
TOWEL OR 17X24 6PK STRL BLUE (TOWEL DISPOSABLE) ×6 IMPLANT
TRAY DSU PREP LF (CUSTOM PROCEDURE TRAY) ×3 IMPLANT
TUBING AQUILEX INFLOW (TUBING) ×3 IMPLANT
TUBING AQUILEX OUTFLOW (TUBING) ×3 IMPLANT
WATER STERILE IRR 500ML POUR (IV SOLUTION) ×3 IMPLANT

## 2017-04-07 NOTE — Anesthesia Procedure Notes (Signed)
Procedure Name: LMA Insertion Date/Time: 04/07/2017 9:10 AM Performed by: Myrtie Soman Pre-anesthesia Checklist: Patient identified, Emergency Drugs available, Suction available and Patient being monitored Patient Re-evaluated:Patient Re-evaluated prior to inductionOxygen Delivery Method: Circle system utilized Preoxygenation: Pre-oxygenation with 100% oxygen Intubation Type: IV induction Ventilation: Mask ventilation without difficulty LMA: LMA inserted LMA Size: 4.0 Number of attempts: 1 Airway Equipment and Method: Bite block Placement Confirmation: positive ETCO2 Tube secured with: Tape Dental Injury: Teeth and Oropharynx as per pre-operative assessment

## 2017-04-07 NOTE — Transfer of Care (Signed)
  Last Vitals:  Vitals:   04/07/17 0709  BP: (!) 163/74  Pulse: 63  Resp: 16  Temp: 37 C    Last Pain:  Vitals:   04/07/17 0801  TempSrc:   PainSc: 5       Patients Stated Pain Goal: 8 (04/07/17 0801)  Immediate Anesthesia Transfer of Care Note  Patient: Nancy Wagner  Procedure(s) Performed: Procedure(s) (LRB): DILATATION & CURETTAGE/HYSTEROSCOPY WITH RESECTOCOPE (N/A)  Patient Location: PACU  Anesthesia Type: General  Level of Consciousness: awake, alert  and oriented  Airway & Oxygen Therapy: Patient Spontanous Breathing and Patient connected to nasal cannula oxygen  Post-op Assessment: Report given to PACU RN and Post -op Vital signs reviewed and stable  Post vital signs: Reviewed and stable  Complications: No apparent anesthesia complications

## 2017-04-07 NOTE — Op Note (Signed)
04/07/2017  9:47 AM  PATIENT:  Nancy Wagner  66 y.o. female  PRE-OPERATIVE DIAGNOSIS:  Post-Menopausal Bleeding  POST-OPERATIVE DIAGNOSIS:  Post-Menopausal Bleeding  PROCEDURE:  Procedure(s): DILATATION & CURETTAGE/HYSTEROSCOPY WITH RESECTOCOPE  SURGEON:  Zeinab Rodwell P, MD  ASSISTANTS: None  ANESTHESIA:   local and general  ESTIMATED BLOOD LOSS: Less than 10 cc  BLOOD ADMINISTERED:none  COMPLICATIONS: None  FINDINGS: The uterus sounded to approximately 9 cm. The endometrial cavity was significantly scarred front to back and only after release of adhesion was I able to visualize the actual endometrial cavity and both tubal ostia. The endometrial cavity was atrophic with no specific lesions.  FLUID DEFICIT: 100 cc  LOCAL MEDICATIONS USED:  LIDOCAINE  and Amount: 10 ml  SPECIMEN:  Source of Specimen:  Endocervical curetting and endometrial curettings  DISPOSITION OF SPECIMEN:  PATHOLOGY  COUNTS:  YES  DESCRIPTION OF PROCEDURE:the patient was taken to the operating room after appropriate identification and placed on the operating table. After the attainment of adequate general anesthesia she was placed in the lithotomy position. A timeout was performed. The perineum and vagina were prepped with multiple layers of Betadine. The bladder was emptied with a an in and out catheter. The perineum was draped in sterile field. A gray speculum was placed in the vagina. The cervix was grasped with a single-tooth tenaculum. A paracervical block was achieved with a total of 10 cc of 2% Xylocaine and the 5 and 7:00 positions. The uterus was sounded.  The cervix was then dilated to accommodate the diagnostic hysteroscope. The endocervix was curetted, and that specimen removed from the operative field. The hysteroscope was used to evaluate all quadrants of the uterus.  After visualization of the significant endometrial adhesions. The adhesions were broken down and the endometrial cavity  couldn't be entered with visualization of both ostia. Endometrial curettings were obtained. All instruments were then removed from the vagina and the patient was awakened from general anesthesia and taken to the recovery room in satisfactory condition having tolerated the procedure well sponge and instrument counts correct.  PLAN OF CARE: Discharge home after postanesthesia care  PATIENT DISPOSITION:  PACU - hemodynamically stable.   Delay start of Pharmacological VTE agent (>24hrs) due to surgical blood loss or risk of bleeding:  yes. SCD hose were used during the procedure   Eldred Manges, MD 9:47 AM

## 2017-04-07 NOTE — Discharge Instructions (Signed)
D & C Home care Instructions:   Personal hygiene:  Used sanitary napkins for vaginal drainage not tampons. Shower or tub bathe the day after your procedure. No douching until bleeding stops. Always wipe from front to back after  Elimination.  Activity: Do not drive or operate any equipment today. The effects of the anesthesia are still present and drowsiness may result. Rest today, not necessarily flat bed rest, just take it easy. You may resume your normal activity in one to 2 days.  Sexual activity: No intercourse for one week or as indicated by your physician  Diet: Eat a light diet as desired this evening. You may resume a regular diet tomorrow.  Return to work: One to 2 days.  General Expectations of your surgery: Vaginal bleeding should be no heavier than a normal period. Spotting may continue up to 10 days. Mild cramps may continue for a couple of days. You may have a regular period in 2-6 weeks.  Unexpected observations call your doctor if these occur: persistent or heavy bleeding. Severe abdominal cramping or pain. Elevation of temperature greater than 100F.  Call for an appointment in one week.    Patient's Signature_______________________________________________________  Nurse's Signature________________________________________________________ Post Anesthesia Home Care Instructions  Activity: Get plenty of rest for the remainder of the day. A responsible individual must stay with you for 24 hours following the procedure.  For the next 24 hours, DO NOT: -Drive a car -Paediatric nurse -Drink alcoholic beverages -Take any medication unless instructed by your physician -Make any legal decisions or sign important papers.  Meals: Start with liquid foods such as gelatin or soup. Progress to regular foods as tolerated. Avoid greasy, spicy, heavy foods. If nausea and/or vomiting occur, drink only clear liquids until the nausea and/or vomiting subsides. Call your physician  if vomiting continues.  Special Instructions/Symptoms: Your throat may feel dry or sore from the anesthesia or the breathing tube placed in your throat during surgery. If this causes discomfort, gargle with warm salt water. The discomfort should disappear within 24 hours.  If you had a scopolamine patch placed behind your ear for the management of post- operative nausea and/or vomiting:  1. The medication in the patch is effective for 72 hours, after which it should be removed.  Wrap patch in a tissue and discard in the trash. Wash hands thoroughly with soap and water. 2. You may remove the patch earlier than 72 hours if you experience unpleasant side effects which may include dry mouth, dizziness or visual disturbances. 3. Avoid touching the patch. Wash your hands with soap and water after contact with the patch.   Hysteroscopy, Care After Refer to this sheet in the next few weeks. These instructions provide you with information on caring for yourself after your procedure. Your health care provider may also give you more specific instructions. Your treatment has been planned according to current medical practices, but problems sometimes occur. Call your health care provider if you have any problems or questions after your procedure. What can I expect after the procedure? After your procedure, it is typical to have the following:  You may have some cramping. This normally lasts for a couple days.  You may have bleeding. This can vary from light spotting for a few days to menstrual-like bleeding for 3-7 days. Follow these instructions at home:  Rest for the first 1-2 days after the procedure.  Only take over-the-counter or prescription medicines as directed by your health care provider. Do not take  aspirin. It can increase the chances of bleeding.  Take showers instead of baths for 2 weeks or as directed by your health care provider.  Do not drive for 24 hours or as directed.  Do not  drink alcohol while taking pain medicine.  Do not use tampons, douche, or have sexual intercourse for 2 weeks or until your health care provider says it is okay.  Take your temperature twice a day for 4-5 days. Write it down each time.  Follow your health care provider's advice about diet, exercise, and lifting.  If you develop constipation, you may:  Take a mild laxative if your health care provider approves.  Add bran foods to your diet.  Drink enough fluids to keep your urine clear or pale yellow.  Try to have someone with you or available to you for the first 24-48 hours, especially if you were given a general anesthetic.  Follow up with your health care provider as directed. Contact a health care provider if:  You feel dizzy or lightheaded.  You feel sick to your stomach (nauseous).  You have abnormal vaginal discharge.  You have a rash.  You have pain that is not controlled with medicine. Get help right away if:  You have bleeding that is heavier than a normal menstrual period.  You have a fever.  You have increasing cramps or pain, not controlled with medicine.  You have new belly (abdominal) pain.  You pass out.  You have pain in the tops of your shoulders (shoulder strap areas).  You have shortness of breath. This information is not intended to replace advice given to you by your health care provider. Make sure you discuss any questions you have with your health care provider. Document Released: 09/06/2013 Document Revised: 04/23/2016 Document Reviewed: 06/15/2013 Elsevier Interactive Patient Education  2017 [employer].

## 2017-04-07 NOTE — Anesthesia Postprocedure Evaluation (Signed)
Anesthesia Post Note  Patient: Nancy Wagner  Procedure(s) Performed: Procedure(s) (LRB): DILATATION & CURETTAGE/HYSTEROSCOPY WITH RESECTOCOPE (N/A)  Patient location during evaluation: PACU Anesthesia Type: General Level of consciousness: awake and alert Pain management: pain level controlled Vital Signs Assessment: post-procedure vital signs reviewed and stable Respiratory status: spontaneous breathing, nonlabored ventilation, respiratory function stable and patient connected to nasal cannula oxygen Cardiovascular status: blood pressure returned to baseline and stable Postop Assessment: no signs of nausea or vomiting Anesthetic complications: no       Last Vitals:  Vitals:   04/07/17 0957 04/07/17 1000  BP: (!) 155/67 (!) 157/63  Pulse: 61 (!) 59  Resp: 13 18  Temp: 36.4 C     Last Pain:  Vitals:   04/07/17 0957  TempSrc:   PainSc: 0-No pain                 Akima Slaugh S

## 2017-04-08 ENCOUNTER — Encounter (HOSPITAL_BASED_OUTPATIENT_CLINIC_OR_DEPARTMENT_OTHER): Payer: Self-pay | Admitting: Obstetrics and Gynecology

## 2017-04-22 ENCOUNTER — Encounter: Payer: Self-pay | Admitting: Internal Medicine

## 2017-04-22 ENCOUNTER — Ambulatory Visit (INDEPENDENT_AMBULATORY_CARE_PROVIDER_SITE_OTHER): Payer: Medicare Other | Admitting: Internal Medicine

## 2017-04-22 VITALS — BP 158/84 | HR 62 | Temp 98.3°F | Ht 63.0 in | Wt 205.0 lb

## 2017-04-22 DIAGNOSIS — I10 Essential (primary) hypertension: Secondary | ICD-10-CM

## 2017-04-22 MED ORDER — CLONIDINE HCL 0.1 MG PO TABS: 0.1000 mg | ORAL_TABLET | Freq: Two times a day (BID) | ORAL | 3 refills | Status: DC

## 2017-04-22 NOTE — Progress Notes (Signed)
   Subjective:    Patient ID: Nancy Wagner, female    DOB: [birth year], 66 y.o.   MRN: [id number]  HPI  66 year old Female for essential HTN recheck. 0.3 mg Clonidine causing a headache. Blood pressure still not well controlled. I obtained 150/84 in right arm with a large cuff.    Review of Systems     Objective:   Physical Exam Chest clear. Cor RRR Ext without edema.Skin warm and dry.       Assessment & Plan:  Clonidine 0.1 mg twice a day. RTC in 3 weeks. Discontinue 0.3 mg clonidine. Continue other medications as previously prescribed.  Explained to her today other options to control blood pressure include changing Micardis to Benicar. Changing HCTZ to Lasix or increasing clonidine to 3 times daily.

## 2017-04-22 NOTE — Patient Instructions (Addendum)
Clonidine 0.1 mg bid. Discontinue clonidine 0.3 mg at bedtime. Continue other antihypertensives as previously prescribed and return in 3 weeks.

## 2017-05-03 NOTE — Addendum Note (Signed)
Addendum  created 05/03/17 1430 by Georgi Navarrete, MD   Sign clinical note    

## 2017-05-03 NOTE — Anesthesia Postprocedure Evaluation (Signed)
Anesthesia Post Note  Patient: Nancy Wagner  Procedure(s) Performed: Procedure(s) (LRB): DILATATION & CURETTAGE/HYSTEROSCOPY WITH RESECTOCOPE (N/A)     Anesthesia Post Evaluation  Last Vitals:  Vitals:   04/07/17 1045 04/07/17 1259  BP: (!) 151/62 (!) 161/60  Pulse: (!) 58 (!) 59  Resp: 16 16  Temp:  36.7 C    Last Pain:  Vitals:   04/07/17 1259  TempSrc: Oral  PainSc:                  [name] S

## 2017-05-12 ENCOUNTER — Other Ambulatory Visit: Payer: Self-pay | Admitting: Internal Medicine

## 2017-05-14 ENCOUNTER — Encounter: Payer: Self-pay | Admitting: Internal Medicine

## 2017-05-14 ENCOUNTER — Ambulatory Visit (INDEPENDENT_AMBULATORY_CARE_PROVIDER_SITE_OTHER): Payer: Medicare Other | Admitting: Internal Medicine

## 2017-05-14 VITALS — BP 140/82 | HR 61 | Temp 97.4°F | Wt 206.0 lb

## 2017-05-14 DIAGNOSIS — I10 Essential (primary) hypertension: Secondary | ICD-10-CM

## 2017-05-14 MED ORDER — OLMESARTAN MEDOXOMIL-HCTZ 40-25 MG PO TABS: 1.0000 | ORAL_TABLET | Freq: Every day | ORAL | 0 refills | Status: DC

## 2017-05-14 NOTE — Progress Notes (Signed)
   Subjective:    Patient ID: Nancy Wagner, female    DOB: 1951-07-16, 66 y.o.   MRN: [id number]  HPI 66 year old Female for follow up on hypertension.At last visit,clonidine was changed to 0.1 mg twice a day in the 0.3 mg at bedtime was discontinued. Blood pressure readings brought in by patient range from 962-836 systolically and 62-94 diastolically. She feels well with no further headache.  She's taking grandchildren to the [facility] and [place] next week and will be gone about a week. Doesn't want to change medication at the present time.    Review of Systems see above     Objective:   Physical Exam  Blood pressure is 148/80 in left arm and 150/90 in right arm. Neck is supple. No JVD. Chest clear. Cardiac exam regular rate and rhythm. Extremities without pitting edema.      Assessment & Plan:  Essential hypertension-difficult to control  Plan: Discontinue Micardis HCT and start Benicar HCT 40/25. Return in 4 weeks. She probably will not start this new medication until she returns from her trip. She will continue Norvasc 5 mg daily and Tenormin 100 mg daily. She's taking clonidine 0.1 mg daily.  Options for the future include: Clonidine could be increased to 3 times daily but higher dose caused headache. We could consider changing HCTZ to Lasix. We could add spironolactone.

## 2017-05-14 NOTE — Patient Instructions (Signed)
Discontinue Micardis HCT and start Benicar HCT 40/25. Continue other blood pressure medications and return in 4 weeks.

## 2017-05-29 ENCOUNTER — Other Ambulatory Visit: Payer: Self-pay | Admitting: Internal Medicine

## 2017-06-11 ENCOUNTER — Encounter: Payer: Self-pay | Admitting: Internal Medicine

## 2017-06-11 ENCOUNTER — Ambulatory Visit (INDEPENDENT_AMBULATORY_CARE_PROVIDER_SITE_OTHER): Payer: Medicare Other | Admitting: Internal Medicine

## 2017-06-11 VITALS — BP 126/80 | HR 68 | Temp 97.9°F | Wt 207.0 lb

## 2017-06-11 DIAGNOSIS — E1169 Type 2 diabetes mellitus with other specified complication: Secondary | ICD-10-CM | POA: Diagnosis not present

## 2017-06-11 DIAGNOSIS — I10 Essential (primary) hypertension: Secondary | ICD-10-CM

## 2017-06-11 DIAGNOSIS — E669 Obesity, unspecified: Secondary | ICD-10-CM | POA: Diagnosis not present

## 2017-06-11 LAB — BASIC METABOLIC PANEL
BUN: 14 mg/dL (ref 7–25)
CO2: 28 mmol/L (ref 20–31)
Calcium: 9.7 mg/dL (ref 8.6–10.4)
Chloride: 98 mmol/L (ref 98–110)
Creat: 1.07 mg/dL — ABNORMAL HIGH (ref 0.50–0.99)
Glucose, Bld: 100 mg/dL — ABNORMAL HIGH (ref 65–99)
Potassium: 3.7 mmol/L (ref 3.5–5.3)
Sodium: 139 mmol/L (ref 135–146)

## 2017-06-11 NOTE — Progress Notes (Signed)
   Subjective:    Patient ID: Nancy Wagner, female    DOB: 11-23-1951, 66 y.o.   MRN: [id number]  HPI 66 year old 18 female in today for blood pressure check. Current regimen includes: Benicar HCTZ 40/25, Norvasc 5 mg daily, Tenormin 100 mg daily and clonidine 0.1 mg at bedtime.  Blood pressure is excellent today. Feels a little tired on Benicar HCTZ .  Had good trip to [facility] with family.    Review of Systems no new complaints     Objective:   Physical Exam BP 126/80  B- met drawn  Chest clear to auscultation. Cardiac exam regular rate and rhythm. No lower extremity edema      Assessment & Plan:  Essential hypertension-difficult to control  Plan: I am pleased with her blood pressure today. She will return in 3 months and continue to monitor her blood pressure at home. She will call if she notices persistent elevation of her blood pressure on this regimen. Basic metabolic panel is pending.

## 2017-06-11 NOTE — Patient Instructions (Signed)
Continue same regimen and return in 3 months. Monitor blood pressure at home and call if persistently elevated.

## 2017-06-26 ENCOUNTER — Other Ambulatory Visit: Payer: Self-pay | Admitting: Internal Medicine

## 2017-07-13 ENCOUNTER — Encounter: Payer: Self-pay | Admitting: Internal Medicine

## 2017-07-13 ENCOUNTER — Ambulatory Visit (INDEPENDENT_AMBULATORY_CARE_PROVIDER_SITE_OTHER): Payer: Medicare Other | Admitting: Internal Medicine

## 2017-07-13 VITALS — BP 130/80 | HR 63 | Temp 97.8°F | Wt 210.0 lb

## 2017-07-13 DIAGNOSIS — M79674 Pain in right toe(s): Secondary | ICD-10-CM

## 2017-07-13 DIAGNOSIS — M7989 Other specified soft tissue disorders: Secondary | ICD-10-CM

## 2017-07-13 LAB — CBC WITH DIFFERENTIAL/PLATELET
Basophils Absolute: 0 cells/uL (ref 0–200)
Basophils Relative: 0 %
EOS PCT: 2 %
Eosinophils Absolute: 170 cells/uL (ref 15–500)
HEMATOCRIT: 39 % (ref 35.0–45.0)
HEMOGLOBIN: 12.8 g/dL (ref 11.7–15.5)
LYMPHS ABS: 3570 {cells}/uL (ref 850–3900)
Lymphocytes Relative: 42 %
MCH: 30 pg (ref 27.0–33.0)
MCHC: 32.8 g/dL (ref 32.0–36.0)
MCV: 91.3 fL (ref 80.0–100.0)
MONOS PCT: 9 %
MPV: 12 fL (ref 7.5–12.5)
Monocytes Absolute: 765 cells/uL (ref 200–950)
NEUTROS ABS: 3995 {cells}/uL (ref 1500–7800)
Neutrophils Relative %: 47 %
Platelets: 260 10*3/uL (ref 140–400)
RBC: 4.27 MIL/uL (ref 3.80–5.10)
RDW: 13.8 % (ref 11.0–15.0)
WBC: 8.5 10*3/uL (ref 3.8–10.8)

## 2017-07-13 MED ORDER — INDOMETHACIN 50 MG PO CAPS: 50.0000 mg | ORAL_CAPSULE | Freq: Three times a day (TID) | ORAL | 0 refills | Status: DC

## 2017-07-13 MED ORDER — DOXYCYCLINE HYCLATE 100 MG PO TABS: 100.0000 mg | ORAL_TABLET | Freq: Two times a day (BID) | ORAL | 0 refills | Status: DC

## 2017-07-13 NOTE — Patient Instructions (Signed)
Doxycycline 100 mg twice daily for 7 days. Indocin 50 mg 3 times daily with food for 5 days. Stay off foot and keep it elevated.

## 2017-07-13 NOTE — Progress Notes (Signed)
   Subjective:    Patient ID: Nancy Wagner, female    DOB: [birth year], 66 y.o.   MRN: [id number]  HPI 66 year old Black Female was vacationing last week and the [facility] when she noticed a swelling in her right first MTP joint. Was not out in the woods. Doesn't recall any sort of insect bite or trauma. Tetanus immunization is up-to-date. Does not feel that it was caused by shoe trauma. Tender to touch. Never had gout before.    Review of Systems     Objective:   Physical Exam  She has a $0.50 size area that's quite tender to touch. It is not a bullous lesion. There is a slight darkening in the center but not clearly a puncture.      Assessment & Plan:  ? Gout right first MTP joint versus trauma versus insect bite  Plan: She's going to have an x-ray of the right MTP joint. Started her on doxycycline 100 mg twice daily for 7 days. Indocin 50 mg 3 times daily with food for 5 days. Follow-up in 48 hours or sooner if worse. CBC with differential, sedimentation rate, uric acid drawn

## 2017-07-14 ENCOUNTER — Ambulatory Visit
Admission: RE | Admit: 2017-07-14 | Discharge: 2017-07-14 | Disposition: A | Discharge status: Home / Self Care | Payer: Medicare Other | Source: Ambulatory Visit | Attending: Internal Medicine | Admitting: Internal Medicine

## 2017-07-14 DIAGNOSIS — M7989 Other specified soft tissue disorders: Principal | ICD-10-CM

## 2017-07-14 DIAGNOSIS — M79674 Pain in right toe(s): Secondary | ICD-10-CM

## 2017-07-14 LAB — SEDIMENTATION RATE: SED RATE: 14 mm/h (ref 0–30)

## 2017-07-14 LAB — URIC ACID: Uric Acid, Serum: 8.5 mg/dL — ABNORMAL HIGH (ref 2.5–7.0)

## 2017-07-15 ENCOUNTER — Encounter: Payer: Self-pay | Admitting: Internal Medicine

## 2017-07-15 ENCOUNTER — Ambulatory Visit (INDEPENDENT_AMBULATORY_CARE_PROVIDER_SITE_OTHER): Payer: Medicare Other | Admitting: Internal Medicine

## 2017-07-15 VITALS — BP 130/70 | HR 63 | Temp 97.6°F | Wt 211.0 lb

## 2017-07-15 DIAGNOSIS — I10 Essential (primary) hypertension: Secondary | ICD-10-CM

## 2017-07-15 DIAGNOSIS — M7989 Other specified soft tissue disorders: Secondary | ICD-10-CM | POA: Diagnosis not present

## 2017-07-15 DIAGNOSIS — M79674 Pain in right toe(s): Secondary | ICD-10-CM

## 2017-07-15 NOTE — Progress Notes (Signed)
   Subjective:    Patient ID: Nancy Wagner, female    DOB: [birth year], 66 y.o.   MRN: [id number]  HPI  66 year old Black Female for follow up Right first MTP joint pain and swelling. At last visit was placed on doxycycline and indomethacin. Over 48 hours, this area medial aspect right MTP joint has improved with less erythema tenderness and swelling. She is wearing a sandal type shoe. She is anxious because she is going on vacation soon and wants this to get better.  History of hypertension and blood pressure is well-controlled today at 130/70. Tends to be labile at times.  Uric acid was elevated. CBC was normal. Sedimentation rate was 14    Review of Systems see above      Objective:   Physical Exam  Right first MTP joint has erythema medially with slight pale discoloration.      Assessment & Plan:  I think this may be gout but I've treated her for cellulitis. She's going to finish course of doxycycline and continue indomethacin. She'll return on Wednesday, August 22.

## 2017-07-21 ENCOUNTER — Ambulatory Visit (INDEPENDENT_AMBULATORY_CARE_PROVIDER_SITE_OTHER): Payer: Medicare Other | Admitting: Internal Medicine

## 2017-07-21 VITALS — BP 150/70 | HR 82 | Wt 210.0 lb

## 2017-07-21 DIAGNOSIS — M79674 Pain in right toe(s): Secondary | ICD-10-CM | POA: Diagnosis not present

## 2017-07-21 DIAGNOSIS — M7989 Other specified soft tissue disorders: Secondary | ICD-10-CM | POA: Diagnosis not present

## 2017-07-22 ENCOUNTER — Other Ambulatory Visit: Payer: Self-pay | Admitting: Internal Medicine

## 2017-07-24 ENCOUNTER — Encounter: Payer: Self-pay | Admitting: Internal Medicine

## 2017-07-24 NOTE — Patient Instructions (Signed)
Continue Indocin. Finish course of doxycycline. Return in mid September for follow-up.

## 2017-07-24 NOTE — Progress Notes (Signed)
   Subjective:    Patient ID: Nancy Wagner, female    DOB: 1951-11-07, 66 y.o.   MRN: [id number]  HPI For follow-up today regarding right first MTP joint swelling. Has been on doxycycline twice daily and Indocin 50 mg 3 times daily. She is slowly improving. Uric acid was elevated. Indocin was prescribed for 5 days. She's been trying to stay off foot. Now is able to wear a tennis shoe.    Review of Systems     Objective:   Physical Exam  Less tenderness erythema and swelling of right first MTP joint.      Assessment & Plan:  Cellulitis versus acute gouty arthritis right first MTP joint  Plan: X-ray of the right foot was negative. Uric acid was elevated. She is going on vacation once again. Doesn't want to start allopurinol at this point in time. We will refill Indocin for her. Stop doxycycline. Return in mid September which time she'll have six-month recheck.

## 2017-07-25 NOTE — Patient Instructions (Signed)
Continue same medications indomethacin and doxycycline and return on August 22.

## 2017-08-31 ENCOUNTER — Other Ambulatory Visit: Payer: Self-pay | Admitting: Internal Medicine

## 2017-09-10 ENCOUNTER — Ambulatory Visit: Payer: Medicare Other | Admitting: Internal Medicine

## 2017-09-14 ENCOUNTER — Ambulatory Visit: Payer: Medicare Other | Admitting: Internal Medicine

## 2017-09-20 ENCOUNTER — Encounter: Payer: Self-pay | Admitting: Internal Medicine

## 2017-09-20 ENCOUNTER — Ambulatory Visit (INDEPENDENT_AMBULATORY_CARE_PROVIDER_SITE_OTHER): Payer: Medicare Other | Admitting: Internal Medicine

## 2017-09-20 ENCOUNTER — Other Ambulatory Visit: Payer: Self-pay | Admitting: Internal Medicine

## 2017-09-20 VITALS — BP 140/62 | HR 60 | Temp 98.0°F | Ht 63.0 in | Wt 210.0 lb

## 2017-09-20 DIAGNOSIS — E119 Type 2 diabetes mellitus without complications: Secondary | ICD-10-CM | POA: Diagnosis not present

## 2017-09-20 DIAGNOSIS — E790 Hyperuricemia without signs of inflammatory arthritis and tophaceous disease: Secondary | ICD-10-CM | POA: Diagnosis not present

## 2017-09-20 DIAGNOSIS — E8881 Metabolic syndrome: Secondary | ICD-10-CM | POA: Diagnosis not present

## 2017-09-20 DIAGNOSIS — Z6837 Body mass index (BMI) 37.0-37.9, adult: Secondary | ICD-10-CM

## 2017-09-20 DIAGNOSIS — I10 Essential (primary) hypertension: Secondary | ICD-10-CM | POA: Diagnosis not present

## 2017-09-20 MED ORDER — ALBUTEROL SULFATE HFA 108 (90 BASE) MCG/ACT IN AERS: 2.0000 | INHALATION_SPRAY | Freq: Four times a day (QID) | RESPIRATORY_TRACT | 2 refills | Status: DC | PRN

## 2017-09-20 NOTE — Progress Notes (Signed)
   Subjective:    Patient ID: Nancy Wagner, female    DOB: Sep 24, 1951, 66 y.o.   MRN: [id number]  HPI Was last seen in July for hypertension.  She had a good trip to the [facility] this Summer with her grandchildren.  Currently taking clonidine 0.1 mg twice daily.  The 0.3 mg dose that she took at bedtime was discontinued in June.  In July blood pressure was excellent at 126/80.  Now on Benicar HCT 40/25.  She takes Tenormin 100 mg daily, amlodipine 5 mg daily, simvastatin 20 mg daily  Blood pressure today is 140/62.  In August blood pressure was 130/70.  She is compliant with her medications.  She just woke up from a nap.  History of impaired glucose tolerance.  Hemoglobin A1c 7.8% and previously 7.7%.  She is on PrandiMet 2/500 for diabetes.  She had a bout of pain and swelling of her right first MTP joint in August.  Uric acid was 8.5.  I wanted to put her on allopurinol but she declined.  She was treated with Indocin and doxycycline at the time and improved.   Review of Systems see above no new complaints     Objective:   Physical Exam Skin warm and dry.  Nodes none.  Neck is supple.  No bruits.  Chest clear.  Cardiac exam regular rate and rhythm.  Extremities without edema.       Assessment & Plan:  Controlled type 2 diabetes mellitus  Essential hypertension  Elevated uric acid  Plan: Continue current medications as previously prescribed and watch blood pressure at home.  Notify me if persistently elevated.  Otherwise see in March 2019.

## 2017-09-21 LAB — HEMOGLOBIN A1C
EAG (MMOL/L): 9.8 (calc)
Hgb A1c MFr Bld: 7.8 % of total Hgb — ABNORMAL HIGH (ref <5.7)
MEAN PLASMA GLUCOSE: 177 (calc)

## 2017-09-24 ENCOUNTER — Other Ambulatory Visit: Payer: Self-pay | Admitting: Internal Medicine

## 2017-09-27 ENCOUNTER — Other Ambulatory Visit: Payer: Self-pay | Admitting: Internal Medicine

## 2017-09-29 NOTE — Patient Instructions (Signed)
It was a pleasure to see you today.  Continue same medications and return in March for physical examination.  Watch blood pressure at home and notify me if persistently elevated.

## 2017-11-15 ENCOUNTER — Other Ambulatory Visit: Payer: Self-pay | Admitting: Internal Medicine

## 2017-11-25 ENCOUNTER — Other Ambulatory Visit: Payer: Self-pay | Admitting: Internal Medicine

## 2017-11-25 DIAGNOSIS — Z1231 Encounter for screening mammogram for malignant neoplasm of breast: Secondary | ICD-10-CM

## 2017-12-10 ENCOUNTER — Other Ambulatory Visit: Payer: Self-pay | Admitting: Obstetrics and Gynecology

## 2017-12-10 DIAGNOSIS — R102 Pelvic and perineal pain: Secondary | ICD-10-CM

## 2017-12-16 ENCOUNTER — Inpatient Hospital Stay
Admission: RE | Admit: 2017-12-16 | Discharge: 2017-12-16 | Disposition: A | Discharge status: Home / Self Care | Payer: Medicare Other | Source: Ambulatory Visit | Attending: Obstetrics and Gynecology | Admitting: Obstetrics and Gynecology

## 2017-12-16 ENCOUNTER — Inpatient Hospital Stay: Admission: RE | Admit: 2017-12-16 | Payer: Medicare Other | Source: Ambulatory Visit

## 2017-12-20 ENCOUNTER — Ambulatory Visit
Admission: RE | Admit: 2017-12-20 | Discharge: 2017-12-20 | Disposition: A | Discharge status: Home / Self Care | Payer: Medicare Other | Source: Ambulatory Visit | Attending: Internal Medicine | Admitting: Internal Medicine

## 2017-12-20 DIAGNOSIS — Z1231 Encounter for screening mammogram for malignant neoplasm of breast: Secondary | ICD-10-CM

## 2017-12-31 ENCOUNTER — Telehealth: Payer: Self-pay | Admitting: Internal Medicine

## 2017-12-31 MED ORDER — CLONIDINE HCL 0.1 MG PO TABS: ORAL_TABLET | ORAL | 3 refills | Status: DC

## 2017-12-31 NOTE — Telephone Encounter (Addendum)
Patient states that you and she had a conversation a while ago that she would take her 0.1mg  Clonidine 1 in the morning and 1 at night.  Her Rx has not been changed.  So, she is running out of her medications too early because the Rx says to take 1 po qhs.  I asked when this was changed?  She said when you changed her from the 0.3 mg to the 0.1.  That was on 06/11/17.  But, there's nothing in your note that indicates those instructions.    She said she doesn't want to come back to the office and her BP be running high again.  She says this is the reason that you all talked about her taking one in the morning and 1 at night.  She thinks this helps to keep the BP down during the day.  She is asking that we send a new Rx to the pharmacy with the correct Rx.  States that it's supposed to be take 1 in the morning and 1 at bedtime.      Pharmacy:  [facility] [address]  Phone [phone]  Thank you.      Note from October does say she is taking Clonidine 0.1 mg bid. Will change this with pharmacy.

## 2018-01-14 ENCOUNTER — Ambulatory Visit
Admission: RE | Admit: 2018-01-14 | Discharge: 2018-01-14 | Disposition: A | Discharge status: Home / Self Care | Payer: Medicare Other | Source: Ambulatory Visit | Attending: Obstetrics and Gynecology | Admitting: Obstetrics and Gynecology

## 2018-01-14 DIAGNOSIS — R102 Pelvic and perineal pain: Secondary | ICD-10-CM

## 2018-01-14 MED ORDER — GADOBENATE DIMEGLUMINE 529 MG/ML IV SOLN: 20.0000 mL | Freq: Once | INTRAVENOUS | Status: DC | PRN

## 2018-02-07 ENCOUNTER — Telehealth: Payer: Self-pay

## 2018-02-07 NOTE — Telephone Encounter (Signed)
Patient called states her GYN did an MRI of her abd and she said there was a spot seen on her pancreas and patient is calling to "be referred to a specialist to follow up on the MRI".

## 2018-02-07 NOTE — Telephone Encounter (Signed)
She needs to come in and discuss. Cannot make referral without OV.

## 2018-02-07 NOTE — Telephone Encounter (Signed)
Spoke with patient.  She will be out of town later this week.  Made appointment for Monday, 3/18 @ 10:45.  Patient confirmed.

## 2018-02-14 ENCOUNTER — Telehealth: Payer: Self-pay | Admitting: Gastroenterology

## 2018-02-14 ENCOUNTER — Ambulatory Visit (INDEPENDENT_AMBULATORY_CARE_PROVIDER_SITE_OTHER): Payer: Medicare Other | Admitting: Internal Medicine

## 2018-02-14 VITALS — BP 130/90 | HR 61 | Temp 98.1°F | Ht 63.0 in | Wt 209.0 lb

## 2018-02-14 DIAGNOSIS — K862 Cyst of pancreas: Secondary | ICD-10-CM

## 2018-02-14 DIAGNOSIS — I10 Essential (primary) hypertension: Secondary | ICD-10-CM

## 2018-02-14 DIAGNOSIS — R778 Other specified abnormalities of plasma proteins: Secondary | ICD-10-CM

## 2018-02-14 NOTE — Telephone Encounter (Signed)
Next available NGI/ROV with me.  Incidental 88mm pancreatic cyst without any concerning features on MRI.

## 2018-02-14 NOTE — Progress Notes (Signed)
   Subjective:    Patient ID: Nancy Wagner, female    DOB: 06-15-1951, 67 y.o.   MRN: [id number]  HPI Pt's GYN ordered a recent MRI because pt had hirsutism and irregular menstual spotting. A small cystic lesion was noted on the pancreas .Not sure CA19-9 would be of help  with a  12mm lesion.  A 2 year follow up was recommended but pt and her husband are not satisfied with that recommendation. She has no abdominal pain gas or bloating.       Review of Systems see above. Hx of difficult to control HTN     Objective:   Physical Exam Abdomen obese soft nondistended without hepatosplenomegaly masses or tenderness.  Skin warm and dry.  Chest clear to auscultation.  Cardiac exam regular rate and rhythm       Assessment & Plan:  6 mm cystic lesion body of pancreas-patient would like another opinion regarding follow-up  Hypertension  Plan: We will refer to [place] for consultation

## 2018-02-14 NOTE — Telephone Encounter (Signed)
Dr Ardis Hughs please see the referral and advise on scheduling.

## 2018-02-15 ENCOUNTER — Other Ambulatory Visit: Payer: Self-pay | Admitting: Internal Medicine

## 2018-02-15 ENCOUNTER — Encounter: Payer: Self-pay | Admitting: Internal Medicine

## 2018-02-15 ENCOUNTER — Encounter: Payer: Self-pay | Admitting: Gastroenterology

## 2018-02-15 DIAGNOSIS — I10 Essential (primary) hypertension: Secondary | ICD-10-CM

## 2018-02-15 DIAGNOSIS — E8881 Metabolic syndrome: Secondary | ICD-10-CM

## 2018-02-15 DIAGNOSIS — E119 Type 2 diabetes mellitus without complications: Secondary | ICD-10-CM

## 2018-02-15 NOTE — Patient Instructions (Signed)
To see gastroenterologist regarding 6 mm apparent pancreatic cyst in consultation  regarding follow-up

## 2018-02-18 ENCOUNTER — Encounter: Payer: Self-pay | Admitting: Internal Medicine

## 2018-02-18 LAB — HM DIABETES EYE EXAM

## 2018-02-21 ENCOUNTER — Other Ambulatory Visit: Payer: Medicare Other | Admitting: Internal Medicine

## 2018-02-22 LAB — CBC WITH DIFFERENTIAL/PLATELET
Basophils Absolute: 73 cells/uL (ref 0–200)
Basophils Relative: 1 %
Eosinophils Absolute: 241 cells/uL (ref 15–500)
Eosinophils Relative: 3.3 %
HCT: 39.1 % (ref 35.0–45.0)
Hemoglobin: 13.2 g/dL (ref 11.7–15.5)
Lymphs Abs: 3263 cells/uL (ref 850–3900)
MCH: 29.9 pg (ref 27.0–33.0)
MCHC: 33.8 g/dL (ref 32.0–36.0)
MCV: 88.7 fL (ref 80.0–100.0)
MONOS PCT: 9.6 %
MPV: 11.7 fL (ref 7.5–12.5)
NEUTROS PCT: 41.4 %
Neutro Abs: 3022 cells/uL (ref 1500–7800)
PLATELETS: 248 10*3/uL (ref 140–400)
RBC: 4.41 10*6/uL (ref 3.80–5.10)
RDW: 13.1 % (ref 11.0–15.0)
TOTAL LYMPHOCYTE: 44.7 %
WBC: 7.3 10*3/uL (ref 3.8–10.8)
WBCMIX: 701 {cells}/uL (ref 200–950)

## 2018-02-22 LAB — COMPLETE METABOLIC PANEL WITH GFR
AG RATIO: 1 (calc) (ref 1.0–2.5)
ALBUMIN MSPROF: 4.2 g/dL (ref 3.6–5.1)
ALT: 15 U/L (ref 6–29)
AST: 18 U/L (ref 10–35)
Alkaline phosphatase (APISO): 41 U/L (ref 33–130)
BILIRUBIN TOTAL: 0.5 mg/dL (ref 0.2–1.2)
BUN / CREAT RATIO: 15 (calc) (ref 6–22)
BUN: 16 mg/dL (ref 7–25)
CO2: 32 mmol/L (ref 20–32)
CREATININE: 1.1 mg/dL — AB (ref 0.50–0.99)
Calcium: 10.2 mg/dL (ref 8.6–10.4)
Chloride: 100 mmol/L (ref 98–110)
GFR, Est African American: 60 mL/min/{1.73_m2} (ref >=60)
GFR, Est Non African American: 52 mL/min/{1.73_m2} — ABNORMAL LOW (ref >=60)
GLOBULIN: 4.2 g/dL — AB (ref 1.9–3.7)
Glucose, Bld: 118 mg/dL — ABNORMAL HIGH (ref 65–99)
POTASSIUM: 4.5 mmol/L (ref 3.5–5.3)
SODIUM: 140 mmol/L (ref 135–146)
Total Protein: 8.4 g/dL — ABNORMAL HIGH (ref 6.1–8.1)

## 2018-02-22 LAB — LIPID PANEL
CHOLESTEROL: 177 mg/dL (ref <200)
HDL: 60 mg/dL (ref >50)
LDL CHOLESTEROL (CALC): 98 mg/dL
Non-HDL Cholesterol (Calc): 117 mg/dL (calc) (ref <130)
TRIGLYCERIDES: 95 mg/dL (ref <150)
Total CHOL/HDL Ratio: 3 (calc) (ref <5.0)

## 2018-02-22 LAB — HEMOGLOBIN A1C
Hgb A1c MFr Bld: 7.3 % of total Hgb — ABNORMAL HIGH (ref <5.7)
Mean Plasma Glucose: 163 (calc)
eAG (mmol/L): 9 (calc)

## 2018-02-22 LAB — TSH: TSH: 2.2 m[IU]/L (ref 0.40–4.50)

## 2018-02-22 LAB — VITAMIN D 25 HYDROXY (VIT D DEFICIENCY, FRACTURES): VIT D 25 HYDROXY: 42 ng/mL (ref 30–100)

## 2018-02-22 LAB — MICROALBUMIN / CREATININE URINE RATIO
Creatinine, Urine: 175 mg/dL (ref 20–275)
MICROALB/CREAT RATIO: 20 ug/mg{creat} (ref <30)
Microalb, Ur: 3.5 mg/dL

## 2018-02-24 ENCOUNTER — Ambulatory Visit: Payer: Medicare Other | Admitting: Internal Medicine

## 2018-02-24 ENCOUNTER — Encounter: Payer: Self-pay | Admitting: Internal Medicine

## 2018-02-24 VITALS — BP 120/70 | HR 67 | Ht 63.0 in | Wt 206.0 lb

## 2018-02-24 DIAGNOSIS — E7849 Other hyperlipidemia: Secondary | ICD-10-CM | POA: Diagnosis not present

## 2018-02-24 DIAGNOSIS — R779 Abnormality of plasma protein, unspecified: Secondary | ICD-10-CM

## 2018-02-24 DIAGNOSIS — I10 Essential (primary) hypertension: Secondary | ICD-10-CM

## 2018-02-24 DIAGNOSIS — E8881 Metabolic syndrome: Secondary | ICD-10-CM | POA: Diagnosis not present

## 2018-02-24 DIAGNOSIS — Z6836 Body mass index (BMI) 36.0-36.9, adult: Secondary | ICD-10-CM

## 2018-02-24 DIAGNOSIS — Z0000 Encounter for general adult medical examination without abnormal findings: Secondary | ICD-10-CM | POA: Diagnosis not present

## 2018-02-24 DIAGNOSIS — K862 Cyst of pancreas: Secondary | ICD-10-CM

## 2018-02-24 DIAGNOSIS — R8290 Unspecified abnormal findings in urine: Secondary | ICD-10-CM | POA: Diagnosis not present

## 2018-02-24 DIAGNOSIS — E119 Type 2 diabetes mellitus without complications: Secondary | ICD-10-CM

## 2018-02-24 LAB — POCT URINALYSIS DIPSTICK
Appearance: NORMAL
BILIRUBIN UA: NEGATIVE
GLUCOSE UA: NEGATIVE
KETONES UA: NEGATIVE
Nitrite, UA: NEGATIVE
Odor: NORMAL
Protein, UA: NEGATIVE
RBC UA: NEGATIVE
SPEC GRAV UA: 1.015 (ref 1.010–1.025)
Urobilinogen, UA: 0.2 E.U./dL
pH, UA: 6.5 (ref 5.0–8.0)

## 2018-02-24 NOTE — Progress Notes (Signed)
Subjective:    Patient ID: Nancy Wagner, female    DOB: [birth year], 67 y.o.   MRN: [id number]  HPI 67 year old Female in today for Medicare wellness, routine health maintenance exam and evaluation of medical issues.  Has appointment to see Dr. 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 Leo Grosser her GYN physician but because patient was having abdominal and back pain and some hormonal issues.  Her family wants her to see a specialist and have further evaluation.  I did draw a CA 1 9-9 today although it may not be of much help with such a small lesion.  She has elevated serum proteins and I drew an SPEP today.  Last year we made an appointment for screening colonoscopy but I do not think she ever had it.  History of obesity.  BMI is 36.49.  History of hypertension and blood pressure stable at 120/70.  She requires several different types of blood pressure medication to control her pressure.  Patient was referred by Juanetta Snow.  She had Tharon Aquas work together at [employer] where patient was employed as a Investment banker, corporate.  Patient is now retired.  She lives with her husband and is raising a granddaughter who is in high school.  Pneumonia 1977.  Throat surgery by Dr. Ernesto Rutherford 1982.  2 C-sections in 1975 in 1978.  She is intolerant of codeine it causes rash and hives.  Social history: Married.  Husband formerly worked as a Land but is now Brewing technologist at [employer].  She goes to the gym a couple times a week.  Does not smoke or consume alcohol.  2 adult children a son and a daughter.  In 2012, she had left knee injected with steroids for osteoarthritis.  Previous PCP was Dr. Felicie Morn who treated her for labile hypertension.  Family history: Father in his late 63s and mother in her  10s in good health.  4 brothers.  One brother  died of complications of cancer.  4 sisters  3 of whom are in good health.  Morning her early 61s with history of MI and other health issues.    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 Satira Sark.Had positive ANA and RF.  Was thought not to have significant autoimmune disease.     Objective:   Physical Exam  Constitutional: She is oriented to person, place, and time. She appears well-developed and well-nourished. No distress.  HENT:  Head: Normocephalic and atraumatic.  Right Ear: External ear normal.  Left Ear: External ear normal.  Mouth/Throat: Oropharynx is clear and moist.  Eyes: Pupils are equal, round, and reactive to light. Conjunctivae and EOM are normal. Right eye exhibits no discharge. Left eye exhibits no discharge. No scleral icterus.  Neck: Neck supple. No JVD present. No thyromegaly present.  Cardiovascular: Normal rate, regular rhythm, normal heart sounds and intact distal pulses.  No murmur heard. Pulmonary/Chest: Effort normal and breath sounds normal. She has no wheezes.  Abdominal: Soft. Bowel sounds are normal. She exhibits no distension and no mass. There is no tenderness. There is no rebound and no guarding.  Genitourinary:  Genitourinary Comments: Deferred to Dr. Leo Grosser  Musculoskeletal: She exhibits no edema.  Lymphadenopathy:    She has no cervical adenopathy.  Neurological: She is alert and oriented to person, place, and time. She  has normal reflexes. No cranial nerve deficit. Coordination normal.  Skin: Skin is warm and dry. No rash noted. She is not diaphoretic.  Psychiatric: She has a normal mood and affect. Her behavior is normal. Judgment and thought content normal.  Vitals reviewed.         Assessment & Plan:  Controlled type 2 diabetes mellitus-takes Glucophage and PrandiMet  Hyperlipidemia currently on simvastatin-  History of difficult to control hypertension but currently under good control   abnormal urine  specimen-culture sent  Osteoarthritis left knee-treated with NSAIDs  Obesity-BMI 86-76.1  Metabolic syndrome  Elevated serum proteins-check SPEP  Pancreatic cyst-patient worried about cancer.  Check CA 19-9 and follow-up with Dr. Edison Nasuti in May.  Health maintenance-needs colonoscopy  Plan: Return in 6 months or sooner if necessary.  She has mildly elevated serum creatinine 1.10-continue to follow  Fasting glucose 118 and hemoglobin A1c 7.3%.  Needs to watch diet.  Subjective:   Patient presents for Medicare Annual/Subsequent preventive examination.  Review Past Medical/Family/Social: See above   Risk Factors  Current exercise habits: Says she goes to the gym a couple of times a week Dietary issues discussed: Low-fat low carbohydrate  Cardiac risk factors: Hyperlipidemia, diabetes, obesity, hypertension  Depression Screen  (Note: if answer to either of the following is "Yes", a more complete depression screening is indicated)   Over the past two weeks, have you felt down, depressed or hopeless? No  Over the past two weeks, have you felt little interest or pleasure in doing things? No Have you lost interest or pleasure in daily life? No Do you often feel hopeless? No Do you cry easily over simple problems? No   Activities of Daily Living  In your present state of health, do you have any difficulty performing the following activities?:   Driving? No  Managing money? No  Feeding yourself? No  Getting from bed to chair? No  Climbing a flight of stairs? No  Preparing food and eating?: No  Bathing or showering? No  Getting dressed: No  Getting to the toilet? No  Using the toilet:No  Moving around from place to place: No  In the past year have you fallen or had a near fall?:No  Are you sexually active? No  Do you have more than one partner? No   Hearing Difficulties: No  Do you often ask people to speak up or repeat themselves? No  Do you experience ringing or  noises in your ears? No  Do you have difficulty understanding soft or whispered voices? No  Do you feel that you have a problem with memory? No Do you often misplace items? No    Home Safety:  Do you have a smoke alarm at your residence? Yes Do you have grab bars in the bathroom?  yes Do you have throw rugs in your house?yes   Cognitive Testing  Alert? Yes Normal Appearance?Yes  Oriented to person? Yes Place? Yes  Time? Yes  Recall of three objects? Yes  Can perform simple calculations? Yes  Displays appropriate judgment?Yes  Can read the correct time from a watch face?Yes   List the Names of Other Physician/Practitioners you currently use:  See referral list for the physicians patient is currently seeing.     Review of Systems: See above  Objective:     General appearance: Appears stated age and  obese  Head: Normocephalic, without obvious abnormality, atraumatic  Eyes: conj clear, EOMi PEERLA  Ears: normal TM's and external ear canals  both ears  Nose: Nares normal. Septum midline. Mucosa normal. No drainage or sinus tenderness.  Throat: lips, mucosa, and tongue normal; teeth and gums normal  Neck: no adenopathy, no carotid bruit, no JVD, supple, symmetrical, trachea midline and thyroid not enlarged, symmetric, no tenderness/mass/nodules  No CVA tenderness.  Lungs: clear to auscultation bilaterally  Breasts: normal appearance, no masses or tenderness Heart: regular rate and rhythm, S1, S2 normal, no murmur, click, rub or gallop  Abdomen: soft, non-tender; bowel sounds normal; no masses, no organomegaly  Musculoskeletal: ROM normal in all joints, no crepitus, no deformity, Normal muscle strengthen. Back  is symmetric, no curvature. Skin: Skin color, texture, turgor normal. No rashes or lesions  Lymph nodes: Cervical, supraclavicular, and axillary nodes normal.  Neurologic: CN 2 -12 Normal, Normal symmetric reflexes. Normal coordination and gait  Psych: Alert & Oriented  x 3, Mood appear stable.    Assessment:    Annual wellness medicare exam   Plan:    During the course of the visit the patient was educated and counseled about appropriate screening and preventive services including:  Annual mammogram  Needs colonoscopy  Annual flu vaccine      Patient Instructions (the written plan) was given to the patient.  Medicare Attestation  I have personally reviewed:  The patient's medical and social history  Their use of alcohol, tobacco or illicit drugs  Their current medications and supplements  The patient's functional ability including ADLs,fall risks, home safety risks, cognitive, and hearing and visual impairment  Diet and physical activities  Evidence for depression or mood disorders  The patient's weight, height, BMI, and visual acuity have been recorded in the chart. I have made referrals, counseling, and provided education to the patient based on review of the above and I have provided the patient with a written personalized care plan for preventive services.

## 2018-02-24 NOTE — Patient Instructions (Addendum)
Pt still worried about Pancreatitic Ca. To seee Dr. Edison Nasuti may 1st. Dian Situ SPEP for elevated serum proteins and CA 19-9. RTC in 6 months.  Encourage diet exercise and weight loss.  Continue same medications.

## 2018-02-25 LAB — URINE CULTURE
MICRO NUMBER: 90388235
SPECIMEN QUALITY:: ADEQUATE

## 2018-03-01 LAB — PROTEIN ELECTROPHORESIS, SERUM
Albumin ELP: 4.1 g/dL (ref 3.8–4.8)
Alpha 1: 0.3 g/dL (ref 0.2–0.3)
Alpha 2: 0.8 g/dL (ref 0.5–0.9)
BETA 2: 1.1 g/dL — AB (ref 0.2–0.5)
BETA GLOBULIN: 0.6 g/dL (ref 0.4–0.6)
Gamma Globulin: 1.8 g/dL — ABNORMAL HIGH (ref 0.8–1.7)
Total Protein: 8.5 g/dL — ABNORMAL HIGH (ref 6.1–8.1)

## 2018-03-01 LAB — CANCER ANTIGEN 19-9: CA 19-9: 36 U/mL — ABNORMAL HIGH (ref <34)

## 2018-03-01 NOTE — Telephone Encounter (Signed)
Please have pt return for immunofixation

## 2018-03-02 ENCOUNTER — Other Ambulatory Visit (INDEPENDENT_AMBULATORY_CARE_PROVIDER_SITE_OTHER): Payer: Medicare Other | Admitting: Internal Medicine

## 2018-03-02 DIAGNOSIS — R778 Other specified abnormalities of plasma proteins: Secondary | ICD-10-CM | POA: Diagnosis not present

## 2018-03-02 NOTE — Progress Notes (Signed)
Pt returns for Immunofixation. Says left arm hurts from previous venipuncture. No bruising noted. Lab drawn right arm by MD with buttterfly. Results obtained thus far explained to pt by MD.

## 2018-03-02 NOTE — Addendum Note (Signed)
Addended by: Mady Haagensen on: 03/02/2018 11:36 AM   Modules accepted: Orders

## 2018-03-10 ENCOUNTER — Other Ambulatory Visit: Payer: Medicare Other | Admitting: Internal Medicine

## 2018-03-10 ENCOUNTER — Other Ambulatory Visit: Payer: Self-pay | Admitting: Internal Medicine

## 2018-03-10 ENCOUNTER — Encounter: Payer: Self-pay | Admitting: Internal Medicine

## 2018-03-10 ENCOUNTER — Telehealth: Payer: Self-pay | Admitting: Internal Medicine

## 2018-03-10 DIAGNOSIS — R778 Other specified abnormalities of plasma proteins: Secondary | ICD-10-CM

## 2018-03-10 NOTE — Telephone Encounter (Signed)
Tried to call pt. Can't leave message due to mailbox being full. Want her to come for repeat lab draw for Immunofixation befor 12 noon today. Last specimen was not submitted correctly.

## 2018-03-11 LAB — TIQ-MISC

## 2018-03-11 LAB — IMMUNOFIXATION ELECTROPHORESIS
IGG (IMMUNOGLOBIN G), SERUM: 1603 mg/dL (ref 694–1618)
IgM, Serum: 684 mg/dL — ABNORMAL HIGH (ref 48–271)
Immunoglobulin A: 918 mg/dL — ABNORMAL HIGH (ref 81–463)

## 2018-03-11 LAB — EXTRA URINE SPECIMEN

## 2018-03-14 ENCOUNTER — Encounter: Payer: Self-pay | Admitting: Internal Medicine

## 2018-03-14 ENCOUNTER — Telehealth: Payer: Self-pay | Admitting: Internal Medicine

## 2018-03-14 ENCOUNTER — Other Ambulatory Visit: Payer: Self-pay | Admitting: Internal Medicine

## 2018-03-14 DIAGNOSIS — R778 Other specified abnormalities of plasma proteins: Secondary | ICD-10-CM

## 2018-03-14 LAB — IMMUNOFIXATION ELECTROPHORESIS
IMMUNOFIX ELECTR INT: NOT DETECTED
IMMUNOGLOBULIN A: 875 mg/dL — AB (ref 81–463)
IgG (Immunoglobin G), Serum: 1582 mg/dL (ref 694–1618)
IgM, Serum: 702 mg/dL — ABNORMAL HIGH (ref 48–271)

## 2018-03-14 NOTE — Telephone Encounter (Signed)
We have made mutiple calls to pt past 2 weeks and pt always has a mailbox that is full.She needs Oncology referral. I spoke with her last wek when she was here for lab work and she is aware we are concerned about Myeloma. Referral has been placed to oncology.Results mailed to pt with note that we are making Oncology referrral

## 2018-03-15 ENCOUNTER — Encounter: Payer: Self-pay | Admitting: Hematology and Oncology

## 2018-03-15 ENCOUNTER — Telehealth: Payer: Self-pay | Admitting: Hematology and Oncology

## 2018-03-15 NOTE — Telephone Encounter (Signed)
Pt has been scheduled to see Dr. Lebron Conners on 5/2 at 1pm. Pt aware to arrive 30 minutes early. Letter mailed.

## 2018-03-23 ENCOUNTER — Telehealth: Payer: Self-pay | Admitting: Internal Medicine

## 2018-03-23 NOTE — Telephone Encounter (Signed)
Nancy Wagner called to see what immunizations she needed to go to [place] on 04-13-2018. She stated that she had her last one in 01/2012. I advised her to call [facility] and they could assist with this. She stated that is where she went last time, so she was going to call them.

## 2018-03-24 NOTE — Telephone Encounter (Signed)
See if ID does travel medicine

## 2018-03-24 NOTE — Telephone Encounter (Signed)
[name] called [place] and they want her to come in for a visit and then come back for Vaccines, she stated that they were too expensive. So she called the health department and they said they would not have vaccines in intime for her trip and advised her to call [facility]. [facility] and they told her they could give her the pills for Malaria and Typhoid if they had a prescription from her PCP. She said this was all she needed because she had her Tetanus.Nancy Wagner

## 2018-03-25 NOTE — Telephone Encounter (Signed)
Called and spoke with Sharyn Lull Travel RN at [facility], to inquire about Travel Clinic, they had no openings before May 15th, so she gave the [place] for Ranita to call to inquire about getting what she needs for her trip.

## 2018-03-25 NOTE — Telephone Encounter (Signed)
Called and spoke with Nancy Wagner to give her the information to call [facility] phone # [phone] to set up an appointment for her Travel vaccines. She said she was going to [place]. I let her know to bring her travel atrinary and the vaccinees that she has already had. She acknowledge understanding and is going to call them.

## 2018-03-26 ENCOUNTER — Other Ambulatory Visit: Payer: Self-pay | Admitting: Internal Medicine

## 2018-03-30 ENCOUNTER — Encounter: Payer: Self-pay | Admitting: Gastroenterology

## 2018-03-30 ENCOUNTER — Ambulatory Visit: Payer: Medicare Other | Admitting: Gastroenterology

## 2018-03-30 ENCOUNTER — Encounter (INDEPENDENT_AMBULATORY_CARE_PROVIDER_SITE_OTHER): Payer: Self-pay

## 2018-03-30 VITALS — BP 148/76 | HR 76 | Ht 63.0 in | Wt 209.8 lb

## 2018-03-30 DIAGNOSIS — K862 Cyst of pancreas: Secondary | ICD-10-CM

## 2018-03-30 DIAGNOSIS — Z1211 Encounter for screening for malignant neoplasm of colon: Secondary | ICD-10-CM

## 2018-03-30 MED ORDER — PEG 3350-KCL-NA BICARB-NACL 420 G PO SOLR: 4000.0000 mL | ORAL | 0 refills | Status: DC

## 2018-03-30 NOTE — Patient Instructions (Addendum)
MRI of pancreas with MRCP 12/2018 for incidental pancreatic cyst noted on 12/2017 MRI.  You will be set up for a colonoscopy for routine risk colon cancer screening (in June or July)  Normal BMI (Body Mass Index- based on height and weight) is between 23 and 30. Your BMI today is Body mass index is 37.16 kg/m. Marland Kitchen Please consider follow up  regarding your BMI with your Primary Care Provider.

## 2018-03-30 NOTE — Progress Notes (Signed)
HPI: This is a  Very pleasant 67 yo woman who was referred to me by Elby Showers, MD  to evaluate  Incidental pancreatic cyst.    Chief complaint is pancreatic cyst  She was having some irregular menstrual bleeding and underwent an ultrasound ordered by her gynecologist.  This was followed up by an MRI for back and abdominal pain.  The MRI noted an incidental, very small pancreatic cyst. She has never had pancreatic problems that she is aware of.  Pancreatic cancer does not run in her family. She was never a significant alcohol drinker.  She does not smoke cigarettes.  Her weight has been overall stable.  She has no GI symptoms either, no significant constipation diarrhea or bleeding.  She has not had colon cancer screening in 15 years or so by a colonoscopy that she tells me was normal.  Old Data Reviewed:  MRI 12/2017 for "abdominal and back pain for 1 year" "...Pancreas: 6 mm cystic lesion seen in pancreatic body. No otherpancreatic lesions identified. No evidence of peripancreatic inflammatory changes. No evidence of pancreatic ductal dilatation or pancreas divisum..." CA 19-9 01/2018 36 (ULN 34)    Review of systems: Pertinent positive and negative review of systems were noted in the above HPI section. All other review negative.   Past Medical History:  Diagnosis Date  . Asthma    controlled  . Cervical stenosis (uterine cervix)   . GERD (gastroesophageal reflux disease)    diet control  . Hypertension   . Lichen simplex chronicus   . OA (osteoarthritis)   . PMB (postmenopausal bleeding)   . Type 2 diabetes mellitus ([place])   . Uterine fibroid     Past Surgical History:  Procedure Laterality Date  . [place]  . CHOLECYSTECTOMY  1984  . DILATATION & CURRETTAGE/HYSTEROSCOPY WITH RESECTOCOPE N/A 04/07/2017   Procedure: DILATATION & CURETTAGE/HYSTEROSCOPY WITH RESECTOCOPE;  Surgeon: Eldred Manges, MD;  Location: [facility];  Service:  Gynecology;  Laterality: N/A;  . THROAT SURGERY  1985   minimal uvulopalatopharyngoplasty for sleep apnea  . TUBAL LIGATION Bilateral 1981    Current Outpatient Medications  Medication Sig Dispense Refill  . albuterol (PROVENTIL HFA;VENTOLIN HFA) 108 (90 Base) MCG/ACT inhaler Inhale 2 puffs into the lungs every 6 (six) hours as needed for wheezing or shortness of breath. 1 Inhaler 2  . amLODipine (NORVASC) 5 MG tablet TAKE 1 TABLET BY MOUTH EVERY DAY 90 tablet 1  . atenolol (TENORMIN) 100 MG tablet TAKE 1 TABLET (100 MG TOTAL) BY MOUTH DAILY. 90 tablet 3  . calcium-vitamin D (OSCAL WITH D) 500-200 MG-UNIT per tablet Take 1 tablet by mouth daily.    . cetirizine (ZYRTEC) 10 MG tablet Take 10 mg by mouth as needed.    . cloNIDine (CATAPRES) 0.1 MG tablet One po bid 180 tablet 3  . fish oil-omega-3 fatty acids 1000 MG capsule Take 2 g by mouth daily. Reported on 06/18/2016    . fluocinonide (LIDEX) 0.05 % external solution Apply 3.41 application topically as needed.  1  . folic acid (FOLVITE) 937 MCG tablet Take 400 mcg by mouth daily.    Marland Kitchen ibuprofen (ADVIL,MOTRIN) 600 MG tablet Ibuprofen 600 mg every 6 hours around-the-clock for 3 days and then every 6 hours as needed for pain 30 tablet 0  . indomethacin (INDOCIN) 50 MG capsule TAKE 1 CAPSULE BY MOUTH THREE TIMES A DAY WITH MEALS 30 capsule 0  . metFORMIN (GLUCOPHAGE) 500  MG tablet TAKE 1 TABLET 3 TIMES A DAY BEFORE MEALS 270 tablet PRN  . olmesartan-hydrochlorothiazide (BENICAR HCT) 40-25 MG tablet TAKE 1 TABLET BY MOUTH EVERY DAY 90 tablet 1  . repaglinide (PRANDIN) 2 MG tablet TAKE 1 TABLET 3 TIMES A DAY BEFORE MEALS 270 tablet 1  . repaglinide-metformin (PRANDIMET) 2-500 MG tablet TAKE 1 TABLET BY MOUTH 3 TIMES A DAY BEFORE MEALS 90 tablet 1  . simvastatin (ZOCOR) 20 MG tablet TAKE 1 TABLET (20 MG TOTAL) BY MOUTH DAILY. 90 tablet 3  . tretinoin (RETIN-A) 0.05 % cream Apply 7.89 application topically daily.  4  . vitamin B-12  (CYANOCOBALAMIN) 100 MCG tablet Take 100 mcg by mouth daily.    . vitamin C (ASCORBIC ACID) 500 MG tablet Take 500 mg by mouth daily.    . vitamin E 400 UNIT capsule Take 400 Units by mouth daily.     No current facility-administered medications for this visit.     Allergies as of 03/30/2018 - Review Complete 03/30/2018  Allergen Reaction Noted  . Adhesive [tape] Rash 06/18/2016  . Codeine Hives, Itching, and Rash 03/15/2012    Family History  Problem Relation Age of Onset  . Heart disease Sister     Social History   Socioeconomic History  . Marital status: Married    Spouse name: Not on file  . Number of children: 2  . Years of education: Not on file  . Highest education level: Not on file  Occupational History  . Not on file  Social Needs  . Financial resource strain: Not on file  . Food insecurity:    Worry: Not on file    Inability: Not on file  . Transportation needs:    Medical: Not on file    Non-medical: Not on file  Tobacco Use  . Smoking status: Never Smoker  . Smokeless tobacco: Never Used  Substance and Sexual Activity  . Alcohol use: Not Currently    Alcohol/week: 0.6 oz    Types: 1 Glasses of wine per week  . Drug use: No  . Sexual activity: Not on file  Lifestyle  . Physical activity:    Days per week: Not on file    Minutes per session: Not on file  . Stress: Not on file  Relationships  . Social connections:    Talks on phone: Not on file    Gets together: Not on file    Attends religious service: Not on file    Active member of club or organization: Not on file    Attends meetings of clubs or organizations: Not on file    Relationship status: Not on file  . Intimate partner violence:    Fear of current or ex partner: Not on file    Emotionally abused: Not on file    Physically abused: Not on file    Forced sexual activity: Not on file  Other Topics Concern  . Not on file  Social History Narrative  . Not on file     Physical  Exam: BP (!) 148/76   Pulse 76   Ht 5\' 3"  (1.6 m)   Wt 209 lb 12.8 oz (95.2 kg)   BMI 37.16 kg/m  Constitutional: generally well-appearing Psychiatric: alert and oriented x3 Eyes: extraocular movements intact Mouth: oral pharynx moist, no lesions Neck: supple no lymphadenopathy Cardiovascular: heart regular rate and rhythm Lungs: clear to auscultation bilaterally Abdomen: soft, nontender, nondistended, no obvious ascites, no peritoneal signs, normal bowel sounds Extremities:  no lower extremity edema bilaterally Skin: no lesions on visible extremities   Assessment and plan: 67 y.o. female with incidental pancreatic cyst, routine risk for colon cancer  We discussed her incidental pancreatic cyst at length with her and her husband.  It is 6 mm and has 0 features that would be concerning.  I recommended that we follow the AGA pancreatic cyst guidelines from 2015, she has 0 out of 3 features which would be concerning and protocol recommends repeat MRI of the pancreas 1 year from her index examination and then 2 years and then in another 2 years.  If at 5 years there is no significant changes, findings then surveillance would be no longer needed.  I had a very nice discussion with her and her husband about this and they understand and agree.  We will put her in our reminder system for MRI of the pancreas with MRCP images February 2020.  She has not had colon cancer screening in about 15 years.  She has no concerning GI symptoms.  I recommended colonoscopy at her soonest convenience.  She wants to to wait until after she gets back from a trip to [place] and so we will shoot for  June or July for screening colonoscopy.  Please see the "Patient Instructions" section for addition details about the plan.   Owens Loffler, MD [facility] 03/30/2018, 10:16 AM  Cc: Elby Showers, MD

## 2018-03-31 ENCOUNTER — Inpatient Hospital Stay: Payer: Medicare Other

## 2018-03-31 ENCOUNTER — Encounter (INDEPENDENT_AMBULATORY_CARE_PROVIDER_SITE_OTHER): Payer: Self-pay

## 2018-03-31 ENCOUNTER — Encounter: Payer: Self-pay | Admitting: Hematology and Oncology

## 2018-03-31 ENCOUNTER — Inpatient Hospital Stay: Payer: Medicare Other | Attending: Hematology and Oncology | Admitting: Hematology and Oncology

## 2018-03-31 VITALS — BP 146/66 | HR 74 | Temp 98.8°F | Resp 20 | Ht 63.0 in | Wt 209.1 lb

## 2018-03-31 DIAGNOSIS — E8809 Other disorders of plasma-protein metabolism, not elsewhere classified: Secondary | ICD-10-CM

## 2018-03-31 DIAGNOSIS — R768 Other specified abnormal immunological findings in serum: Secondary | ICD-10-CM | POA: Diagnosis not present

## 2018-03-31 LAB — LACTATE DEHYDROGENASE: LDH: 141 U/L (ref 125–245)

## 2018-03-31 LAB — CBC WITH DIFFERENTIAL (CANCER CENTER ONLY)
BASOS ABS: 0.1 10*3/uL (ref 0.0–0.1)
BASOS PCT: 1 %
Eosinophils Absolute: 0.1 10*3/uL (ref 0.0–0.5)
Eosinophils Relative: 2 %
HEMATOCRIT: 38.1 % (ref 34.8–46.6)
HEMOGLOBIN: 12.6 g/dL (ref 11.6–15.9)
LYMPHS PCT: 37 %
Lymphs Abs: 2.8 10*3/uL (ref 0.9–3.3)
MCH: 29.9 pg (ref 25.1–34.0)
MCHC: 33 g/dL (ref 31.5–36.0)
MCV: 90.5 fL (ref 79.5–101.0)
MONO ABS: 0.8 10*3/uL (ref 0.1–0.9)
Monocytes Relative: 10 %
NEUTROS ABS: 3.8 10*3/uL (ref 1.5–6.5)
NEUTROS PCT: 50 %
Platelet Count: 220 10*3/uL (ref 145–400)
RBC: 4.21 MIL/uL (ref 3.70–5.45)
RDW: 14.3 % (ref 11.2–14.5)
WBC: 7.5 10*3/uL (ref 3.9–10.3)

## 2018-03-31 LAB — CMP (CANCER CENTER ONLY)
ALK PHOS: 43 U/L (ref 40–150)
ALT: 18 U/L (ref 0–55)
AST: 20 U/L (ref 5–34)
Albumin: 3.8 g/dL (ref 3.5–5.0)
Anion gap: 8 (ref 3–11)
BUN: 19 mg/dL (ref 7–26)
CALCIUM: 10.3 mg/dL (ref 8.4–10.4)
CHLORIDE: 102 mmol/L (ref 98–109)
CO2: 29 mmol/L (ref 22–29)
CREATININE: 1.2 mg/dL — AB (ref 0.60–1.10)
GFR, EST AFRICAN AMERICAN: 53 mL/min — AB (ref >60)
GFR, Estimated: 45 mL/min — ABNORMAL LOW (ref >60)
Glucose, Bld: 113 mg/dL (ref 70–140)
Potassium: 3.7 mmol/L (ref 3.5–5.1)
SODIUM: 139 mmol/L (ref 136–145)
Total Bilirubin: 0.4 mg/dL (ref 0.2–1.2)
Total Protein: 8.8 g/dL — ABNORMAL HIGH (ref 6.4–8.3)

## 2018-04-01 LAB — BETA 2 MICROGLOBULIN, SERUM: BETA 2 MICROGLOBULIN: 1.9 mg/L (ref 0.6–2.4)

## 2018-04-01 LAB — KAPPA/LAMBDA LIGHT CHAINS
KAPPA, LAMDA LIGHT CHAIN RATIO: 1.16 (ref 0.26–1.65)
Kappa free light chain: 33.6 mg/L — ABNORMAL HIGH (ref 3.3–19.4)
LAMDA FREE LIGHT CHAINS: 29 mg/L — AB (ref 5.7–26.3)

## 2018-04-02 LAB — VISCOSITY, SERUM: Viscosity, Serum: 2 rel.saline — ABNORMAL HIGH (ref 1.6–1.9)

## 2018-04-04 LAB — MULTIPLE MYELOMA PANEL, SERUM
ALBUMIN SERPL ELPH-MCNC: 3.5 g/dL (ref 2.9–4.4)
ALPHA 1: 0.2 g/dL (ref 0.0–0.4)
ALPHA2 GLOB SERPL ELPH-MCNC: 0.9 g/dL (ref 0.4–1.0)
Albumin/Glob SerPl: 0.8 (ref 0.7–1.7)
B-Globulin SerPl Elph-Mcnc: 1.4 g/dL — ABNORMAL HIGH (ref 0.7–1.3)
Gamma Glob SerPl Elph-Mcnc: 2.1 g/dL — ABNORMAL HIGH (ref 0.4–1.8)
Globulin, Total: 4.6 g/dL — ABNORMAL HIGH (ref 2.2–3.9)
IGG (IMMUNOGLOBIN G), SERUM: 1520 mg/dL (ref 700–1600)
IgA: 949 mg/dL — ABNORMAL HIGH (ref 87–352)
IgM (Immunoglobulin M), Srm: 705 mg/dL — ABNORMAL HIGH (ref 26–217)
TOTAL PROTEIN ELP: 8.1 g/dL (ref 6.0–8.5)

## 2018-04-05 ENCOUNTER — Ambulatory Visit (HOSPITAL_COMMUNITY)
Admission: RE | Admit: 2018-04-05 | Discharge: 2018-04-05 | Disposition: A | Discharge status: Home / Self Care | Payer: Medicare Other | Source: Ambulatory Visit | Attending: Hematology and Oncology | Admitting: Hematology and Oncology

## 2018-04-05 DIAGNOSIS — E8809 Other disorders of plasma-protein metabolism, not elsewhere classified: Secondary | ICD-10-CM | POA: Insufficient documentation

## 2018-04-11 ENCOUNTER — Encounter: Payer: Self-pay | Admitting: Hematology and Oncology

## 2018-04-11 ENCOUNTER — Inpatient Hospital Stay (HOSPITAL_BASED_OUTPATIENT_CLINIC_OR_DEPARTMENT_OTHER): Payer: Medicare Other | Admitting: Hematology and Oncology

## 2018-04-11 VITALS — BP 163/67 | HR 66 | Temp 98.3°F | Resp 18 | Ht 63.0 in | Wt 209.3 lb

## 2018-04-11 DIAGNOSIS — R768 Other specified abnormal immunological findings in serum: Secondary | ICD-10-CM | POA: Diagnosis not present

## 2018-04-11 DIAGNOSIS — E8809 Other disorders of plasma-protein metabolism, not elsewhere classified: Secondary | ICD-10-CM

## 2018-04-12 ENCOUNTER — Ambulatory Visit: Payer: Medicare Other | Admitting: Hematology and Oncology

## 2018-04-12 ENCOUNTER — Telehealth: Payer: Self-pay | Admitting: Hematology and Oncology

## 2018-04-12 NOTE — Telephone Encounter (Signed)
Return if symptoms worsen or fail to improve per 5/13 los

## 2018-04-20 DIAGNOSIS — E8809 Other disorders of plasma-protein metabolism, not elsewhere classified: Secondary | ICD-10-CM | POA: Insufficient documentation

## 2018-04-20 NOTE — Assessment & Plan Note (Signed)
66 y.o. female with family history of multiple myeloma referred to the clinic due to abnormal protein values.  No definitive presence of monoclonal protein, but quantitative immunoglobulins demonstrate significant elevation of IgA and IgM.  Differential is broad and includes primary hematological conditions such as lymphoproliferative disorders and multiple myeloma versus non-malignant etiologies such as infectious and rheumatological conditions..  I believe repeat lab work is in order to further delineate the nature of the elevation of the proteins.  plan: - Labs today as outlined below. -Skeletal survey. -Return to clinic in 1 week to review the findings.  

## 2018-04-20 NOTE — Progress Notes (Signed)
[facility] New Visit:  Assessment: Dysproteinemia 67 y.o. female with family history of multiple myeloma referred to the clinic due to abnormal protein values.  No definitive presence of monoclonal protein, but quantitative immunoglobulins demonstrate significant elevation of IgA and IgM.  Differential is broad and includes primary hematological conditions such as lymphoproliferative disorders and multiple myeloma versus non-malignant etiologies such as infectious and rheumatological conditions..  I believe repeat lab work is in order to further delineate the nature of the elevation of the proteins.  plan: - Labs today as outlined below. -Skeletal survey. -Return to clinic in 1 week to review the findings.    Voice recognition software was used and creation of this note. Despite my best effort at editing the text, some misspelling/errors may have occurred. Orders Placed This Encounter  Procedures  . DG Bone Survey Met    Order Specific Question:   Reason for Exam (SYMPTOM  OR DIAGNOSIS REQUIRED)    Answer:   Possible multiple myealoma, please eval for lytic skeletal lesions    Order Specific Question:   Preferred imaging location?    Answer:   [facility]    Order Specific Question:   Radiology Contrast Protocol - do NOT remove file path    Answer:   _0 [email or web address]  . CBC with Differential ([place] Only)    Standing Status:   Future    Number of Occurrences:   1    Standing Expiration Date:   04/01/2019  . CMP ([place] only)    Standing Status:   Future    Number of Occurrences:   1    Standing Expiration Date:   04/01/2019  . Lactate dehydrogenase (LDH)    Standing Status:   Future    Number of Occurrences:   1    Standing Expiration Date:   03/31/2019  . Beta 2 microglobulin, serum  . Multiple Myeloma Panel (SPEP&IFE w/QIG)    Standing Status:   Future    Number of Occurrences:   1    Standing  Expiration Date:   03/31/2019  . Viscosity, serum    Standing Status:   Future    Number of Occurrences:   1    Standing Expiration Date:   03/31/2019  . Kappa/lambda light chains    Standing Status:   Future    Number of Occurrences:   1    Standing Expiration Date:   03/31/2019    All questions were answered.  . The patient knows to call the clinic with any problems, questions or concerns.  This note was electronically signed.    History of Presenting Illness Nancy Wagner 67 y.o. presenting to the [facility] for abnormal results of serum protein electrophoresis which was initially obtained due to elevated protein value.  Patient referred by Dr Elby Showers.  Patient's past medical history is significant for hypertension, diabetes mellitus type 2, and osteoarthritis.  Family history significant for mother with multiple myeloma, maternal grand aunt with leukemia, and sister with rheumatoid arthritis.  Patient denies tobacco use, alcohol use, or use of over-the-counter/herbal medications other than Aleve and Advil that she uses for her osteoarthritis pain.  At this time, patient denies fevers, chills, night sweats.  No unexpected weight loss or change in activity tolerance.  Patient does have musculoskeletal complaints but those are focused on arthralgias and she denies any significant pain in the back or long bones.  Patient denies any numbness, tingling, or changes in  strength in extremities or face.  Denies shortness of breath, chest pain, or cough.  No nausea, vomiting, abdominal pain, diarrhea, or constipation.  No dysuria or hematuria.  Oncological/hematological History: --Labs, 02/24/18: tProt 8.4, Alb ..., Ca 10.2, Cr 1.1, AP 41, LDH ..., beta-2 microglobulin ...; SPEP --2 prominent abnormal protein bands, SIFE -- positive for possible lambda light chain monoclonal protein; WBC 7.3, Hgb 13.2, Plt 248; --Labs, 03/02/18: IgG 1603, IgA 918, IgM 684 --Labs, 03/10/18: IgG 1582, IgA 875,  IgM 702  Medical History: Past Medical History:  Diagnosis Date  . Asthma    controlled  . Cervical stenosis (uterine cervix)   . GERD (gastroesophageal reflux disease)    diet control  . Hypertension   . Lichen simplex chronicus   . OA (osteoarthritis)   . PMB (postmenopausal bleeding)   . Type 2 diabetes mellitus ([place])   . Uterine fibroid     Surgical History: Past Surgical History:  Procedure Laterality Date  . [place]  . CHOLECYSTECTOMY  1984  . DILATATION & CURRETTAGE/HYSTEROSCOPY WITH RESECTOCOPE N/A 04/07/2017   Procedure: DILATATION & CURETTAGE/HYSTEROSCOPY WITH RESECTOCOPE;  Surgeon: Eldred Manges, MD;  Location: [facility];  Service: Gynecology;  Laterality: N/A;  . THROAT SURGERY  1985   minimal uvulopalatopharyngoplasty for sleep apnea  . TUBAL LIGATION Bilateral 1981    Family History: Family History  Problem Relation Age of Onset  . Heart disease Sister     Social History: Social History   Socioeconomic History  . Marital status: Married    Spouse name: Not on file  . Number of children: 2  . Years of education: Not on file  . Highest education level: Not on file  Occupational History  . Not on file  Social Needs  . Financial resource strain: Not on file  . Food insecurity:    Worry: Not on file    Inability: Not on file  . Transportation needs:    Medical: Not on file    Non-medical: Not on file  Tobacco Use  . Smoking status: Never Smoker  . Smokeless tobacco: Never Used  Substance and Sexual Activity  . Alcohol use: Not Currently  . Drug use: No  . Sexual activity: Not on file  Lifestyle  . Physical activity:    Days per week: Not on file    Minutes per session: Not on file  . Stress: Not on file  Relationships  . Social connections:    Talks on phone: Not on file    Gets together: Not on file    Attends religious service: Not on file    Active member of club or organization: Not on file     Attends meetings of clubs or organizations: Not on file    Relationship status: Not on file  . Intimate partner violence:    Fear of current or ex partner: Not on file    Emotionally abused: Not on file    Physically abused: Not on file    Forced sexual activity: Not on file  Other Topics Concern  . Not on file  Social History Narrative  . Not on file    Allergies: Allergies  Allergen Reactions  . Adhesive [Tape] Rash    Band-Aid  . Codeine Hives, Itching and Rash    Medications:  Current Outpatient Medications  Medication Sig Dispense Refill  . albuterol (PROVENTIL HFA;VENTOLIN HFA) 108 (90 Base) MCG/ACT inhaler Inhale 2 puffs into the lungs  every 6 (six) hours as needed for wheezing or shortness of breath. 1 Inhaler 2  . amLODipine (NORVASC) 5 MG tablet TAKE 1 TABLET BY MOUTH EVERY DAY 90 tablet 1  . atenolol (TENORMIN) 100 MG tablet TAKE 1 TABLET (100 MG TOTAL) BY MOUTH DAILY. 90 tablet 3  . calcium-vitamin D (OSCAL WITH D) 500-200 MG-UNIT per tablet Take 1 tablet by mouth daily.    . cetirizine (ZYRTEC) 10 MG tablet Take 10 mg by mouth as needed.    . cloNIDine (CATAPRES) 0.1 MG tablet One po bid 180 tablet 3  . fish oil-omega-3 fatty acids 1000 MG capsule Take 2 g by mouth daily. Reported on 06/18/2016    . fluocinonide (LIDEX) 0.05 % external solution Apply 9.37 application topically as needed.  1  . folic acid (FOLVITE) 342 MCG tablet Take 400 mcg by mouth daily.    Marland Kitchen ibuprofen (ADVIL,MOTRIN) 600 MG tablet Ibuprofen 600 mg every 6 hours around-the-clock for 3 days and then every 6 hours as needed for pain 30 tablet 0  . indomethacin (INDOCIN) 50 MG capsule TAKE 1 CAPSULE BY MOUTH THREE TIMES A DAY WITH MEALS 30 capsule 0  . metFORMIN (GLUCOPHAGE) 500 MG tablet TAKE 1 TABLET 3 TIMES A DAY BEFORE MEALS 270 tablet PRN  . olmesartan-hydrochlorothiazide (BENICAR HCT) 40-25 MG tablet TAKE 1 TABLET BY MOUTH EVERY DAY 90 tablet 1  . polyethylene glycol-electrolytes  (NULYTELY/GOLYTELY) 420 g solution Take 4,000 mLs by mouth as directed. 4000 mL 0  . repaglinide (PRANDIN) 2 MG tablet TAKE 1 TABLET 3 TIMES A DAY BEFORE MEALS 270 tablet 1  . repaglinide-metformin (PRANDIMET) 2-500 MG tablet TAKE 1 TABLET BY MOUTH 3 TIMES A DAY BEFORE MEALS 90 tablet 1  . simvastatin (ZOCOR) 20 MG tablet TAKE 1 TABLET (20 MG TOTAL) BY MOUTH DAILY. 90 tablet 3  . tretinoin (RETIN-A) 0.05 % cream Apply 8.76 application topically daily.  4  . vitamin B-12 (CYANOCOBALAMIN) 100 MCG tablet Take 100 mcg by mouth daily.    . vitamin C (ASCORBIC ACID) 500 MG tablet Take 500 mg by mouth daily.    . vitamin E 400 UNIT capsule Take 400 Units by mouth daily.     No current facility-administered medications for this visit.     Review of Systems: Review of Systems  All other systems reviewed and are negative.    PHYSICAL EXAMINATION Blood pressure (!) 146/66, pulse 74, temperature 98.8 F (37.1 C), temperature source Oral, resp. rate 20, height 5' 3" (1.6 m), weight 209 lb 1.6 oz (94.8 kg), SpO2 99 %.  ECOG PERFORMANCE STATUS: 0 - Asymptomatic  Physical Exam  Constitutional: She is oriented to person, place, and time. She appears well-developed and well-nourished. No distress.  HENT:  Head: Normocephalic.  Mouth/Throat: Oropharynx is clear and moist. No oropharyngeal exudate.  Eyes: Pupils are equal, round, and reactive to light. Conjunctivae and EOM are normal. No scleral icterus.  Neck: No thyromegaly present.  Cardiovascular: Normal rate, regular rhythm, normal heart sounds and intact distal pulses. Exam reveals no gallop and no friction rub.  No murmur heard. Pulmonary/Chest: Effort normal and breath sounds normal. No stridor. No respiratory distress. She has no wheezes. She has no rales.  Abdominal: Soft. Bowel sounds are normal. She exhibits no distension and no mass. There is no tenderness. There is no guarding.  Musculoskeletal: She exhibits no edema.  Lymphadenopathy:     She has no cervical adenopathy.  Neurological: She is alert and oriented to person,  place, and time. She displays normal reflexes. No cranial nerve deficit or sensory deficit.  Skin: Skin is warm and dry. No rash noted. She is not diaphoretic. No erythema. No pallor.     LABORATORY DATA: I have personally reviewed the data as listed: Appointment on 03/31/2018  Component Date Value Ref Range Status  . Kappa free light chain 03/31/2018 33.6* 3.3 - 19.4 mg/L Final  . Lamda free light chains 03/31/2018 29.0* 5.7 - 26.3 mg/L Final  . Kappa, lamda light chain ratio 03/31/2018 1.16  0.26 - 1.65 Final   Comment: (NOTE) Performed At: [facility] [place], [place] [id number] Rush Farmer MD PH:[id number] Performed at [facility], [place] [address], [place], [place] [address]   . Viscosity, Serum 03/31/2018 2.0* 1.6 - 1.9 rel.saline Final   Comment: (NOTE) Values above 2.7 may indicate paraproteinemia is present. This test was developed and its performance characteristics determined by [facility]. It has not been cleared or approved by the Food and Drug Administration. Performed At: [facility] [place], [place] [id number] Rush Farmer MD [id number] Performed at [facility], [place] [address] [place], [place] [address]   . IgG (Immunoglobin G), Serum 03/31/2018 1,520  700 - 1,600 mg/dL Final  . IgA 03/31/2018 949* 87 - 352 mg/dL Final   Comment: (NOTE) Results confirmed on dilution.   . IgM (Immunoglobulin M), Srm 03/31/2018 705* 26 - 217 mg/dL Final   Comment: (NOTE) Results confirmed on dilution.   . Total Protein ELP 03/31/2018 8.1  6.0 - 8.5 g/dL Corrected  . Albumin SerPl Elph-Mcnc 03/31/2018 3.5  2.9 - 4.4 g/dL Corrected  . Alpha 1 03/31/2018 0.2  0.0 - 0.4 g/dL Corrected  . Alpha2 Glob SerPl Elph-Mcnc 03/31/2018 0.9  0.4 - 1.0 g/dL Corrected  . B-Globulin SerPl Elph-Mcnc  03/31/2018 1.4* 0.7 - 1.3 g/dL Corrected  . Gamma Glob SerPl Elph-Mcnc 03/31/2018 2.1* 0.4 - 1.8 g/dL Corrected  . M Protein SerPl Elph-Mcnc 03/31/2018 Not Observed  Not Observed g/dL Corrected  . Globulin, Total 03/31/2018 4.6* 2.2 - 3.9 g/dL Corrected  . Albumin/Glob SerPl 03/31/2018 0.8  0.7 - 1.7 Corrected  . IFE 1 03/31/2018 Comment   Corrected   Comment: (NOTE) An apparent polyclonal gammopathy: IgA and IgM. Kappa and lambda typing appear increased.   . Please Note 03/31/2018 Comment   Corrected   Comment: (NOTE) Protein electrophoresis scan will follow via computer, mail, or courier delivery. Performed At: [facility] [place], [place] [id number] Rush Farmer MD YM:[id number] Performed at [facility], [place] [address], [place], [place] [address]   . LDH 03/31/2018 141  125 - 245 U/L Final   Performed at [facility], [place] [address] [place], [place] [address]  . Sodium 03/31/2018 139  136 - 145 mmol/L Final  . Potassium 03/31/2018 3.7  3.5 - 5.1 mmol/L Final  . Chloride 03/31/2018 102  98 - 109 mmol/L Final  . CO2 03/31/2018 29  22 - 29 mmol/L Final  . Glucose, Bld 03/31/2018 113  70 - 140 mg/dL Final  . BUN 03/31/2018 19  7 - 26 mg/dL Final  . Creatinine 03/31/2018 1.20* 0.60 - 1.10 mg/dL Final  . Calcium 03/31/2018 10.3  8.4 - 10.4 mg/dL Final  . Total Protein 03/31/2018 8.8* 6.4 - 8.3 g/dL Final  . Albumin 03/31/2018 3.8  3.5 - 5.0 g/dL Final  . AST 03/31/2018 20  5 - 34 U/L Final  . ALT 03/31/2018 18  0 - 55 U/L Final  . Alkaline Phosphatase 03/31/2018 43  40 - 150 U/L Final  . Total Bilirubin 03/31/2018 0.4  0.2 - 1.2 mg/dL Final  . GFR, Est Non Af Am 03/31/2018 45* >60 mL/min Final  . GFR, Est AFR Am 03/31/2018 53* >60 mL/min Final   Comment: (NOTE) The eGFR has been calculated using the CKD EPI equation. This calculation has not been validated in all clinical situations. eGFR's  persistently <60 mL/min signify possible Chronic Kidney Disease.   Georgiann Hahn gap 03/31/2018 8  3 - 11 Final   Performed at [facility], [place] [address], [place], [place] [address]  . WBC Count 03/31/2018 7.5  3.9 - 10.3 K/uL Final  . RBC 03/31/2018 4.21  3.70 - 5.45 MIL/uL Final  . Hemoglobin 03/31/2018 12.6  11.6 - 15.9 g/dL Final  . HCT 03/31/2018 38.1  34.8 - 46.6 % Final  . MCV 03/31/2018 90.5  79.5 - 101.0 fL Final  . MCH 03/31/2018 29.9  25.1 - 34.0 pg Final  . MCHC 03/31/2018 33.0  31.5 - 36.0 g/dL Final  . RDW 03/31/2018 14.3  11.2 - 14.5 % Final  . Platelet Count 03/31/2018 220  145 - 400 K/uL Final  . Neutrophils Relative % 03/31/2018 50  % Final  . Neutro Abs 03/31/2018 3.8  1.5 - 6.5 K/uL Final  . Lymphocytes Relative 03/31/2018 37  % Final  . Lymphs Abs 03/31/2018 2.8  0.9 - 3.3 K/uL Final  . Monocytes Relative 03/31/2018 10  % Final  . Monocytes Absolute 03/31/2018 0.8  0.1 - 0.9 K/uL Final  . Eosinophils Relative 03/31/2018 2  % Final  . Eosinophils Absolute 03/31/2018 0.1  0.0 - 0.5 K/uL Final  . Basophils Relative 03/31/2018 1  % Final  . Basophils Absolute 03/31/2018 0.1  0.0 - 0.1 K/uL Final   Performed at [facility], [place] [address] [place], [place] [address]  Office Visit on 03/31/2018  Component Date Value Ref Range Status  . Beta-2 Microglobulin 03/31/2018 1.9  0.6 - 2.4 mg/L Final   Comment: (NOTE) Siemens Immulite 2000 Immunochemiluminometric assay (ICMA) Values obtained with different assay methods or kits cannot be used interchangeably. Results cannot be interpreted as absolute evidence of the presence or absence of malignant disease. Performed At: [facility] [place], [place] [id number] Rush Farmer MD [id number] Performed at [facility], [place] [address] [place], [place] [address]          Ardath Sax, MD

## 2018-04-25 ENCOUNTER — Encounter: Payer: Self-pay | Admitting: Gastroenterology

## 2018-04-27 NOTE — Assessment & Plan Note (Signed)
67 y.o. female with family history of multiple myeloma referred to the clinic due to abnormal protein values.  Detailed hematological assessment reveals no evidence of monoclonal gammopathy at this time.  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  Although patient is completely asymptomatic at this time  Plan: - Recommend primary care provider to assess patient for possible autoimmune conditions such as lupus, rheumatoid arthritis, and possible chronic infections. -Return to our clinic as needed if additional hematological abnormalities are discovered

## 2018-04-27 NOTE — Progress Notes (Signed)
[facility] Follow-up Visit:  Assessment: Dysproteinemia 67 y.o. female with family history of multiple myeloma referred to the clinic due to abnormal protein values.  Detailed hematological assessment reveals no evidence of monoclonal gammopathy at this time.  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  Although patient is completely asymptomatic at this time  Plan: - Recommend primary care provider to assess patient for possible autoimmune conditions such as lupus, rheumatoid arthritis, and possible chronic infections. -Return to our clinic as needed if additional hematological abnormalities are discovered   Voice recognition software was used and creation of this note. Despite my best effort at editing the text, some misspelling/errors may have occurred.  No orders of the defined types were placed in this encounter.   Cancer Staging No matching staging information was found for the patient.  All questions were answered.  . The patient knows to call the clinic with any problems, questions or concerns.  This note was electronically signed.    History of Presenting Illness Nancy Wagner is a 67 y.o. female followed in the [facility] for abnormal results of serum protein electrophoresis which was initially obtained due to elevated protein value.  Patient referred by Dr Elby Showers.  Patient's past medical history is significant for hypertension, diabetes mellitus type 2, and osteoarthritis.  Family history significant for mother with multiple myeloma, maternal grand aunt with leukemia, and sister with rheumatoid arthritis.  Patient denies tobacco use, alcohol use, or use of  over-the-counter/herbal medications other than Aleve and Advil that she uses for her osteoarthritis pain.  Patient returns to the clinic to review the findings at the last visit to the clinic.  She denies any new symptoms compared to the last visit.  At this time, patient denies fevers, chills, night sweats.  No unexpected weight loss or change in activity tolerance.  Patient does have musculoskeletal complaints but those are focused on arthralgias and she denies any significant pain in the back or long bones.  Patient denies any numbness, tingling, or changes in strength in extremities or face.  Denies shortness of breath, chest pain, or cough.  No nausea, vomiting, abdominal pain, diarrhea, or constipation.  No dysuria or hematuria.  Oncological/hematological History: --Labs, 02/24/18: tProt 8.4, Alb   ..., Ca 10.2, Cr 1.1, AP 41, LDH    ..., beta-2 microglobulin   ...;   SPEP -- 2 prominent abnormal protein bands, SIFE -- positive for possible lambda light chain monoclonal protein;   WBC 7.3, Hgb 13.2, Plt 248; --Labs, 03/02/18:                     IgG 1603, IgA 918, IgM 684 --Labs, 03/10/18:                     IgG 1582, IgA 875, IgM 702 --Labs, 03/31/18: tProt 8.8, Alb 3.8, Ca 10.3, Cr 1.2, AP 43, LDH 141, beta-2 microglobulin 1.9, Visc 2.0; SPEP -- polyclonal gammopathy with elevated IgA and IgM; IgG 1520, IgA 949, IgM 705; kappa 33.6, lambda 29.0, KLR 1.16;  WBC 7.5, Hgb 12.6, Plt 220 --Skeletal Survey, 04/05/18: No skeletal lytic lesions   No history exists.    Medical History: Past Medical History:  Diagnosis Date  .  Asthma    controlled  . Cervical stenosis (uterine cervix)   . GERD (gastroesophageal reflux disease)    diet control  . Hypertension   . Lichen simplex chronicus   . OA (osteoarthritis)   . PMB (postmenopausal bleeding)   . Type 2 diabetes mellitus ([place])   . Uterine fibroid     Surgical History: Past Surgical History:  Procedure Laterality Date  . [place]  . CHOLECYSTECTOMY  1984  . DILATATION & CURRETTAGE/HYSTEROSCOPY WITH RESECTOCOPE N/A 04/07/2017   Procedure: DILATATION & CURETTAGE/HYSTEROSCOPY WITH RESECTOCOPE;  Surgeon: Eldred Manges, MD;  Location: [facility];  Service: Gynecology;  Laterality: N/A;  . THROAT SURGERY  1985   minimal uvulopalatopharyngoplasty for sleep apnea  . TUBAL LIGATION Bilateral 1981    Family History: Family History  Problem Relation Age of Onset  . Heart disease Sister     Social History: Social History   Socioeconomic History  . Marital status: Married    Spouse name: Not on file  . Number of children: 2  . Years of education: Not on file  . Highest education level: Not on file  Occupational History  . Not on file  Social Needs  . Financial resource strain: Not on file  . Food insecurity:    Worry: Not on file    Inability: Not on file  . Transportation needs:    Medical: Not on file    Non-medical: Not on file  Tobacco Use  . Smoking status: Never Smoker  . Smokeless tobacco: Never Used  Substance and Sexual Activity  . Alcohol use: Not Currently  . Drug use: No  . Sexual activity: Not on file  Lifestyle  . Physical activity:    Days per week: Not on file    Minutes per session: Not on file  . Stress: Not on file  Relationships  . Social connections:    Talks on phone: Not on file    Gets together: Not on file    Attends religious service: Not on file    Active member of club or organization: Not on file    Attends meetings of clubs or organizations: Not on file    Relationship status: Not on file  . Intimate partner violence:    Fear of current or ex partner: Not on file    Emotionally abused: Not on file    Physically abused: Not on file    Forced sexual activity: Not on file  Other Topics Concern  . Not on file  Social History Narrative  . Not on file    Allergies: Allergies  Allergen Reactions  . Adhesive [Tape] Rash     Band-Aid  . Codeine Hives, Itching and Rash    Medications:  Current Outpatient Medications  Medication Sig Dispense Refill  . albuterol (PROVENTIL HFA;VENTOLIN HFA) 108 (90 Base) MCG/ACT inhaler Inhale 2 puffs into the lungs every 6 (six) hours as needed for wheezing or shortness of breath. 1 Inhaler 2  . amLODipine (NORVASC) 5 MG tablet TAKE 1 TABLET BY MOUTH EVERY DAY 90 tablet 1  . atenolol (TENORMIN) 100 MG tablet TAKE 1 TABLET (100 MG TOTAL) BY MOUTH DAILY. 90 tablet 3  . calcium-vitamin D (OSCAL WITH D) 500-200 MG-UNIT per tablet Take 1 tablet by mouth daily.    . cetirizine (ZYRTEC) 10 MG tablet Take 10 mg by mouth as needed.    . cloNIDine (CATAPRES) 0.1 MG tablet One po bid 180  tablet 3  . fish oil-omega-3 fatty acids 1000 MG capsule Take 2 g by mouth daily. Reported on 06/18/2016    . fluocinonide (LIDEX) 0.05 % external solution Apply 5.05 application topically as needed.  1  . folic acid (FOLVITE) 697 MCG tablet Take 400 mcg by mouth daily.    Marland Kitchen ibuprofen (ADVIL,MOTRIN) 600 MG tablet Ibuprofen 600 mg every 6 hours around-the-clock for 3 days and then every 6 hours as needed for pain 30 tablet 0  . indomethacin (INDOCIN) 50 MG capsule TAKE 1 CAPSULE BY MOUTH THREE TIMES A DAY WITH MEALS 30 capsule 0  . metFORMIN (GLUCOPHAGE) 500 MG tablet TAKE 1 TABLET 3 TIMES A DAY BEFORE MEALS 270 tablet PRN  . olmesartan-hydrochlorothiazide (BENICAR HCT) 40-25 MG tablet TAKE 1 TABLET BY MOUTH EVERY DAY 90 tablet 1  . polyethylene glycol-electrolytes (NULYTELY/GOLYTELY) 420 g solution Take 4,000 mLs by mouth as directed. 4000 mL 0  . repaglinide (PRANDIN) 2 MG tablet TAKE 1 TABLET 3 TIMES A DAY BEFORE MEALS 270 tablet 1  . repaglinide-metformin (PRANDIMET) 2-500 MG tablet TAKE 1 TABLET BY MOUTH 3 TIMES A DAY BEFORE MEALS 90 tablet 1  . simvastatin (ZOCOR) 20 MG tablet TAKE 1 TABLET (20 MG TOTAL) BY MOUTH DAILY. 90 tablet 3  . tretinoin (RETIN-A) 0.05 % cream Apply 9.48 application topically  daily.  4  . vitamin B-12 (CYANOCOBALAMIN) 100 MCG tablet Take 100 mcg by mouth daily.    . vitamin C (ASCORBIC ACID) 500 MG tablet Take 500 mg by mouth daily.    . vitamin E 400 UNIT capsule Take 400 Units by mouth daily.     No current facility-administered medications for this visit.     Review of Systems: Review of Systems  All other systems reviewed and are negative.    PHYSICAL EXAMINATION Blood pressure (!) 163/67, pulse 66, temperature 98.3 F (36.8 C), temperature source Oral, resp. rate 18, height '5\' 3"'  (1.6 m), weight 209 lb 4.8 oz (94.9 kg), SpO2 99 %.  ECOG PERFORMANCE STATUS: 0 - Asymptomatic  Physical Exam  Constitutional: She is oriented to person, place, and time. She appears well-developed and well-nourished. No distress.  HENT:  Head: Normocephalic.  Mouth/Throat: Oropharynx is clear and moist. No oropharyngeal exudate.  Eyes: Pupils are equal, round, and reactive to light. Conjunctivae and EOM are normal. No scleral icterus.  Neck: No thyromegaly present.  Cardiovascular: Normal rate, regular rhythm, normal heart sounds and intact distal pulses. Exam reveals no gallop and no friction rub.  No murmur heard. Pulmonary/Chest: Effort normal and breath sounds normal. No stridor. No respiratory distress. She has no wheezes. She has no rales.  Abdominal: Soft. Bowel sounds are normal. She exhibits no distension and no mass. There is no tenderness. There is no guarding.  Musculoskeletal: She exhibits no edema.  Lymphadenopathy:    She has no cervical adenopathy.  Neurological: She is alert and oriented to person, place, and time. She displays normal reflexes. No cranial nerve deficit or sensory deficit.  Skin: Skin is warm and dry. No rash noted. She is not diaphoretic. No erythema. No pallor.     LABORATORY DATA: I have personally reviewed the data as listed: No visits with results within 1 Week(s) from this visit.  Latest known visit with results is:   Appointment on 03/31/2018  Component Date Value Ref Range Status  . Kappa free light chain 03/31/2018 33.6* 3.3 - 19.4 mg/L Final  . Lamda free light chains 03/31/2018 29.0* 5.7 -  26.3 mg/L Final  . Kappa, lamda light chain ratio 03/31/2018 1.16  0.26 - 1.65 Final   Comment: (NOTE) Performed At: [facility] [place], [place] [id number] Rush Farmer MD [id number] Performed at [facility], [place] [address] [place], [place] [address]   . Viscosity, Serum 03/31/2018 2.0* 1.6 - 1.9 rel.saline Final   Comment: (NOTE) Values above 2.7 may indicate paraproteinemia is present. This test was developed and its performance characteristics determined by [facility]. It has not been cleared or approved by the Food and Drug Administration. Performed At: [facility] [place], [place] [id number] Rush Farmer MD [id number] Performed at [facility], [place] [address], [place], [place] [address]   . IgG (Immunoglobin G), Serum 03/31/2018 1,520  700 - 1,600 mg/dL Final  . IgA 03/31/2018 949* 87 - 352 mg/dL Final   Comment: (NOTE) Results confirmed on dilution.   . IgM (Immunoglobulin M), Srm 03/31/2018 705* 26 - 217 mg/dL Final   Comment: (NOTE) Results confirmed on dilution.   . Total Protein ELP 03/31/2018 8.1  6.0 - 8.5 g/dL Corrected  . Albumin SerPl Elph-Mcnc 03/31/2018 3.5  2.9 - 4.4 g/dL Corrected  . Alpha 1 03/31/2018 0.2  0.0 - 0.4 g/dL Corrected  . Alpha2 Glob SerPl Elph-Mcnc 03/31/2018 0.9  0.4 - 1.0 g/dL Corrected  . B-Globulin SerPl Elph-Mcnc 03/31/2018 1.4* 0.7 - 1.3 g/dL Corrected  . Gamma Glob SerPl Elph-Mcnc 03/31/2018 2.1* 0.4 - 1.8 g/dL Corrected  . M Protein SerPl Elph-Mcnc 03/31/2018 Not Observed  Not Observed g/dL Corrected  . Globulin, Total 03/31/2018 4.6* 2.2 - 3.9 g/dL Corrected  . Albumin/Glob SerPl 03/31/2018 0.8  0.7 - 1.7 Corrected  . IFE 1 03/31/2018 Comment    Corrected   Comment: (NOTE) An apparent polyclonal gammopathy: IgA and IgM. Kappa and lambda typing appear increased.   . Please Note 03/31/2018 Comment   Corrected   Comment: (NOTE) Protein electrophoresis scan will follow via computer, mail, or courier delivery. Performed At: [facility] [place], [place] [id number] Rush Farmer MD [id number] Performed at [facility], [place] [address], [place], [place] [address]   . LDH 03/31/2018 141  125 - 245 U/L Final   Performed at [facility], [place] [address] [place], [place] [address]  . Sodium 03/31/2018 139  136 - 145 mmol/L Final  . Potassium 03/31/2018 3.7  3.5 - 5.1 mmol/L Final  . Chloride 03/31/2018 102  98 - 109 mmol/L Final  . CO2 03/31/2018 29  22 - 29 mmol/L Final  . Glucose, Bld 03/31/2018 113  70 - 140 mg/dL Final  . BUN 03/31/2018 19  7 - 26 mg/dL Final  . Creatinine 03/31/2018 1.20* 0.60 - 1.10 mg/dL Final  . Calcium 03/31/2018 10.3  8.4 - 10.4 mg/dL Final  . Total Protein 03/31/2018 8.8* 6.4 - 8.3 g/dL Final  . Albumin 03/31/2018 3.8  3.5 - 5.0 g/dL Final  . AST 03/31/2018 20  5 - 34 U/L Final  . ALT 03/31/2018 18  0 - 55 U/L Final  . Alkaline Phosphatase 03/31/2018 43  40 - 150 U/L Final  . Total Bilirubin 03/31/2018 0.4  0.2 - 1.2 mg/dL Final  . GFR, Est Non Af Am 03/31/2018 45* >60 mL/min Final  . GFR, Est AFR Am 03/31/2018 53* >60 mL/min Final   Comment: (NOTE) The eGFR has been calculated using the CKD EPI equation. This calculation  has not been validated in all clinical situations. eGFR's persistently <60 mL/min signify possible Chronic Kidney Disease.   Georgiann Hahn gap 03/31/2018 8  3 - 11 Final   Performed at [facility], [place] [address], [place], [place] [address]  . WBC Count 03/31/2018 7.5  3.9 - 10.3 K/uL Final  . RBC 03/31/2018 4.21  3.70 - 5.45 MIL/uL Final  . Hemoglobin 03/31/2018 12.6  11.6 - 15.9 g/dL  Final  . HCT 03/31/2018 38.1  34.8 - 46.6 % Final  . MCV 03/31/2018 90.5  79.5 - 101.0 fL Final  . MCH 03/31/2018 29.9  25.1 - 34.0 pg Final  . MCHC 03/31/2018 33.0  31.5 - 36.0 g/dL Final  . RDW 03/31/2018 14.3  11.2 - 14.5 % Final  . Platelet Count 03/31/2018 220  145 - 400 K/uL Final  . Neutrophils Relative % 03/31/2018 50  % Final  . Neutro Abs 03/31/2018 3.8  1.5 - 6.5 K/uL Final  . Lymphocytes Relative 03/31/2018 37  % Final  . Lymphs Abs 03/31/2018 2.8  0.9 - 3.3 K/uL Final  . Monocytes Relative 03/31/2018 10  % Final  . Monocytes Absolute 03/31/2018 0.8  0.1 - 0.9 K/uL Final  . Eosinophils Relative 03/31/2018 2  % Final  . Eosinophils Absolute 03/31/2018 0.1  0.0 - 0.5 K/uL Final  . Basophils Relative 03/31/2018 1  % Final  . Basophils Absolute 03/31/2018 0.1  0.0 - 0.1 K/uL Final   Performed at [facility], [place] [address] [place], [place] [address]       Ardath Sax, MD

## 2018-05-02 ENCOUNTER — Telehealth: Payer: Self-pay | Admitting: Internal Medicine

## 2018-05-02 NOTE — Telephone Encounter (Signed)
[facility]P @ [facility] called to get the last reading of BP for patient.  Provided reading from CPE on 02/24/18.  BP was 120/70.

## 2018-05-09 ENCOUNTER — Ambulatory Visit (AMBULATORY_SURGERY_CENTER): Payer: Medicare Other | Admitting: Gastroenterology

## 2018-05-09 ENCOUNTER — Other Ambulatory Visit: Payer: Self-pay

## 2018-05-09 ENCOUNTER — Encounter: Payer: Self-pay | Admitting: Gastroenterology

## 2018-05-09 VITALS — BP 111/58 | HR 58 | Temp 97.5°F | Resp 11 | Ht 63.0 in | Wt 209.0 lb

## 2018-05-09 DIAGNOSIS — Z1211 Encounter for screening for malignant neoplasm of colon: Secondary | ICD-10-CM | POA: Diagnosis not present

## 2018-05-09 DIAGNOSIS — K649 Unspecified hemorrhoids: Secondary | ICD-10-CM

## 2018-05-09 MED ORDER — SODIUM CHLORIDE 0.9 % IV SOLN: 500.0000 mL | Freq: Once | INTRAVENOUS | Status: DC

## 2018-05-09 NOTE — Progress Notes (Signed)
Pt's states no medical or surgical changes since previsit or office visit. 

## 2018-05-09 NOTE — Op Note (Signed)
[facility] Patient Name: Nancy Wagner Procedure Date: 05/09/2018 1:48 PM MRN: [id number] Endoscopist: Milus Banister , MD Age: 67 Referring MD:  Date of Birth: August 30, 1951 Gender: Female Account #: [id number] Procedure:                Colonoscopy Indications:              Screening for colorectal malignant neoplasm Medicines:                Monitored Anesthesia Care Procedure:                Pre-Anesthesia Assessment:                           - Prior to the procedure, a History and Physical                            was performed, and patient medications and                            allergies were reviewed. The patient's tolerance of                            previous anesthesia was also reviewed. The risks                            and benefits of the procedure and the sedation                            options and risks were discussed with the patient.                            All questions were answered, and informed consent                            was obtained. Prior Anticoagulants: The patient has                            taken no previous anticoagulant or antiplatelet                            agents. ASA Grade Assessment: III - A patient with                            severe systemic disease. After reviewing the risks                            and benefits, the patient was deemed in                            satisfactory condition to undergo the procedure.                           After obtaining informed consent, the colonoscope  was passed under direct vision. Throughout the                            procedure, the patient's blood pressure, pulse, and                            oxygen saturations were monitored continuously. The                            Colonoscope was introduced through the anus and                            advanced to the the cecum, identified by                            appendiceal orifice  and ileocecal valve. The                            colonoscopy was performed without difficulty. The                            patient tolerated the procedure well. The quality                            of the bowel preparation was good. The ileocecal                            valve, appendiceal orifice, and rectum were                            photographed. Scope In: 1:55:11 PM Scope Out: 2:07:15 PM Scope Withdrawal Time: 0 hours 8 minutes 37 seconds  Total Procedure Duration: 0 hours 12 minutes 4 seconds  Findings:                 External and internal hemorrhoids were found. The                            hemorrhoids were small.                           The exam was otherwise without abnormality on                            direct and retroflexion views. Complications:            No immediate complications. Estimated blood loss:                            None. Estimated Blood Loss:     Estimated blood loss: none. Impression:               - External and internal hemorrhoids.                           - The examination was otherwise normal on direct  and retroflexion views.                           - No polyps or cancers. Recommendation:           - Patient has a contact number available for                            emergencies. The signs and symptoms of potential                            delayed complications were discussed with the                            patient. Return to normal activities tomorrow.                            Written discharge instructions were provided to the                            patient.                           - Resume previous diet.                           - Continue present medications.                           - Repeat colonoscopy in 10 years for screening. Milus Banister, MD 05/09/2018 2:09:50 PM This report has been signed electronically.

## 2018-05-09 NOTE — Progress Notes (Signed)
Report given to PACU, vss 

## 2018-05-09 NOTE — Patient Instructions (Signed)
*  Handout given on hemorrhoids.  YOU HAD AN ENDOSCOPIC PROCEDURE TODAY AT [facility]:   Refer to the procedure report that was given to you for any specific questions about what was found during the examination.  If the procedure report does not answer your questions, please call your gastroenterologist to clarify.  If you requested that your care partner not be given the details of your procedure findings, then the procedure report has been included in a sealed envelope for you to review at your convenience later.  YOU SHOULD EXPECT: Some feelings of bloating in the abdomen. Passage of more gas than usual.  Walking can help get rid of the air that was put into your GI tract during the procedure and reduce the bloating. If you had a lower endoscopy (such as a colonoscopy or flexible sigmoidoscopy) you may notice spotting of blood in your stool or on the toilet paper. If you underwent a bowel prep for your procedure, you may not have a normal bowel movement for a few days.  Please Note:  You might notice some irritation and congestion in your nose or some drainage.  This is from the oxygen used during your procedure.  There is no need for concern and it should clear up in a day or so.  SYMPTOMS TO REPORT IMMEDIATELY:   Following lower endoscopy (colonoscopy or flexible sigmoidoscopy):  Excessive amounts of blood in the stool  Significant tenderness or worsening of abdominal pains  Swelling of the abdomen that is new, acute  Fever of 100F or higher   For urgent or emergent issues, a gastroenterologist can be reached at any hour by calling [phone].   DIET:  We do recommend a small meal at first, but then you may proceed to your regular diet.  Drink plenty of fluids but you should avoid alcoholic beverages for 24 hours.  ACTIVITY:  You should plan to take it easy for the rest of today and you should NOT DRIVE or use heavy machinery until tomorrow (because of the sedation  medicines used during the test).    FOLLOW UP: Our staff will call the number listed on your records the next business day following your procedure to check on you and address any questions or concerns that you may have regarding the information given to you following your procedure. If we do not reach you, we will leave a message.  However, if you are feeling well and you are not experiencing any problems, there is no need to return our call.  We will assume that you have returned to your regular daily activities without incident.  If any biopsies were taken you will be contacted by phone or by letter within the next 1-3 weeks.  Please call us at [phone] if you have not heard about the biopsies in 3 weeks.    SIGNATURES/CONFIDENTIALITY: You and/or your care partner have signed paperwork which will be entered into your electronic medical record.  These signatures attest to the fact that that the information above on your After Visit Summary has been reviewed and is understood.  Full responsibility of the confidentiality of this discharge information lies with you and/or your care-partner.

## 2018-05-10 ENCOUNTER — Telehealth: Payer: Self-pay | Admitting: *Deleted

## 2018-05-10 NOTE — Telephone Encounter (Signed)
  Follow up Call-  Call back number 05/09/2018  Post procedure Call Back phone  # [phone]  Permission to leave phone message No  Some recent data might be hidden     Patient questions:  Do you have a fever, pain , or abdominal swelling? No. Pain Score  0 *  Have you tolerated food without any problems? Yes.    Have you been able to return to your normal activities? Yes.    Do you have any questions about your discharge instructions: Diet   No. Medications  No. Follow up visit  No.  Do you have questions or concerns about your Care? No.  Actions: * If pain score is 4 or above: No action needed, pain <4.

## 2018-06-22 ENCOUNTER — Telehealth: Payer: Self-pay | Admitting: *Deleted

## 2018-06-22 NOTE — Telephone Encounter (Addendum)
Per patient request I have moved her appt from August 5th to August 14th.  Mailed new patient pack

## 2018-07-04 ENCOUNTER — Ambulatory Visit: Payer: Medicare Other | Admitting: Obstetrics

## 2018-07-13 ENCOUNTER — Inpatient Hospital Stay: Payer: Medicare Other | Attending: Hematology and Oncology | Admitting: Obstetrics

## 2018-07-13 ENCOUNTER — Other Ambulatory Visit: Payer: Self-pay | Admitting: Oncology

## 2018-07-13 ENCOUNTER — Encounter: Payer: Self-pay | Admitting: Obstetrics

## 2018-07-13 VITALS — BP 120/70 | HR 56 | Temp 98.0°F | Resp 18 | Ht 63.0 in | Wt 208.0 lb

## 2018-07-13 DIAGNOSIS — N950 Postmenopausal bleeding: Secondary | ICD-10-CM | POA: Diagnosis present

## 2018-07-13 DIAGNOSIS — E119 Type 2 diabetes mellitus without complications: Secondary | ICD-10-CM

## 2018-07-13 DIAGNOSIS — Z5181 Encounter for therapeutic drug level monitoring: Secondary | ICD-10-CM

## 2018-07-13 DIAGNOSIS — R7989 Other specified abnormal findings of blood chemistry: Secondary | ICD-10-CM

## 2018-07-13 DIAGNOSIS — E349 Endocrine disorder, unspecified: Secondary | ICD-10-CM | POA: Diagnosis not present

## 2018-07-13 DIAGNOSIS — E782 Mixed hyperlipidemia: Secondary | ICD-10-CM

## 2018-07-13 DIAGNOSIS — Z79899 Other long term (current) drug therapy: Secondary | ICD-10-CM

## 2018-07-13 NOTE — Progress Notes (Signed)
[facility] and [facility] at [facility] Note: New Patient FIRST VISIT   Consult was requested by Dr. Kendall Flack for increasing testosterone levels with unknown source   Chief Complaint  Patient presents with  . Post-menopausal bleeding  . Elevated testosterone level    HPI: Ms. Nancy Wagner  is a very nice 67 y.o.  P2  Patient had a dream in March 2019 that she was pregnant.  That same day she had postmenopausal bleeding that lasted 2 days.  She followed up with Dr. Leo Grosser.  At the same time the patient had been experiencing increased libido and her dermatologist told her teenage acne.  Given these unusual hormonal complaints a testosterone level was ordered and found to be elevated.  Note the patient does have a history of postmenopausal bleeding and approximately May 2018 i.e. 1 year prior to the recent bleeding episode she had a hysteroscopic D&C with benign findings.  Last time the patient had any spotting was May 2019.  She occasionally notices pelvic cramps.  She has a history of a vulvar condition for which clobetasol has been prescribed for many years and she uses this medication 1-2 times per week  Given the elevated testosterone levels and the bleeding complaints a transvaginal ultrasound was ordered for 1 of the ovaries was unable to be visualized and so a follow-up MRI pelvis was ordered February 2019.  The MRI showed a 4 mm endometrial stripe normal postmenopausal right ovary and a normal postmenopausal left ovary.  There was mild bilateral external iliac lymphadenopathy which was nonspecific. 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  CA-19-9 is noted slightly elevated below recommendations was to follow-up with them for another scan  The patient has been seen in the past by hematology oncology for some "protein in her blood" to  rule out "multiple myeloma" apparently she has been cleared.  She denies any intake of medication that may be elevating her testosterone. Denies any new changes in medications. 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  Imported EPIC Oncologic History:   No history exists.    ECOG PERFORMANCE STATUS: N/A  Measurement of disease: Recent Labs    02/24/18 1458  CA199 36*    Total Testosterone  01/24/18 - 193 (nml high 45) 06/03/18 - 253   Free Testosterone  01/24/18 - 21.4 (nml high 6.4) 06/03/18 - 27.1   Estradiol 06/03/18 - 34 (slightly high for postmenopause which should be <31)  Radiology: No results found.  01/14/18 - MRI Abdomen - IMPRESSION: No acute findings. 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 Recommend follow-up by abdomen MRI without and with contrast in 2 years. This recommendation follows ACR consensus guidelines:   01/14/18 - MRI Pelvis - IMPRESSION: Normal postmenopausal appearance of uterus and both ovaries. Endometrial thickness measures 4 mm. Mild bilateral external iliac lymphadenopathy, which is nonspecific. Recommend follow-up by abdomen pelvis CT with contrast in 6 months.   Outpatient Encounter Medications as of 07/13/2018  Medication Sig  . amLODipine (NORVASC) 5 MG tablet TAKE 1 TABLET BY MOUTH EVERY DAY  . atenolol (TENORMIN) 100 MG tablet TAKE 1 TABLET (100 MG TOTAL) BY MOUTH DAILY.  . calcium-vitamin D (OSCAL WITH D) 500-200 MG-UNIT per tablet Take 1 tablet by mouth daily.  Marland Kitchen  cetirizine (ZYRTEC) 10 MG tablet Take 10 mg by mouth as needed.  . cloNIDine (CATAPRES) 0.1 MG tablet One po bid  . fish oil-omega-3 fatty acids 1000 MG capsule Take 2 g by mouth daily. Reported on 06/18/2016  . fluocinonide (LIDEX) 0.05 % external solution Apply 2.63 application topically as needed.  . folic acid (FOLVITE) 785 MCG tablet Take 400 mcg by mouth daily.  Marland Kitchen ibuprofen (ADVIL,MOTRIN) 600  MG tablet Ibuprofen 600 mg every 6 hours around-the-clock for 3 days and then every 6 hours as needed for pain  . indomethacin (INDOCIN) 50 MG capsule TAKE 1 CAPSULE BY MOUTH THREE TIMES A DAY WITH MEALS  . metFORMIN (GLUCOPHAGE) 500 MG tablet TAKE 1 TABLET 3 TIMES A DAY BEFORE MEALS  . olmesartan-hydrochlorothiazide (BENICAR HCT) 40-25 MG tablet TAKE 1 TABLET BY MOUTH EVERY DAY  . repaglinide (PRANDIN) 2 MG tablet TAKE 1 TABLET 3 TIMES A DAY BEFORE MEALS  . simvastatin (ZOCOR) 20 MG tablet TAKE 1 TABLET (20 MG TOTAL) BY MOUTH DAILY.  Marland Kitchen tretinoin (RETIN-A) 0.05 % cream Apply 8.85 application topically daily.  . vitamin B-12 (CYANOCOBALAMIN) 100 MCG tablet Take 100 mcg by mouth daily.  . vitamin C (ASCORBIC ACID) 500 MG tablet Take 500 mg by mouth daily.  . vitamin E 400 UNIT capsule Take 400 Units by mouth daily.  Marland Kitchen albuterol (PROVENTIL HFA;VENTOLIN HFA) 108 (90 Base) MCG/ACT inhaler Inhale 2 puffs into the lungs every 6 (six) hours as needed for wheezing or shortness of breath. (Patient not taking: Reported on 05/09/2018)  . [DISCONTINUED] repaglinide-metformin (PRANDIMET) 2-500 MG tablet TAKE 1 TABLET BY MOUTH 3 TIMES A DAY BEFORE MEALS   Facility-Administered Encounter Medications as of 07/13/2018  Medication  . 0.9 %  sodium chloride infusion   Allergies  Allergen Reactions  . Adhesive [Tape] Rash    Band-Aid  . Codeine Hives, Itching and Rash    Past Medical History:  Diagnosis Date  . Asthma    controlled  . Cervical stenosis (uterine cervix)   . GERD (gastroesophageal reflux disease)    diet control  . Hypertension   . Lichen simplex chronicus   . OA (osteoarthritis)   . PMB (postmenopausal bleeding)   . Type 2 diabetes mellitus ([place])   . Uterine fibroid    Past Surgical History:  Procedure Laterality Date  . [place]  . CHOLECYSTECTOMY OPEN  1984  . DILATATION & CURRETTAGE/HYSTEROSCOPY WITH RESECTOCOPE N/A 04/07/2017   Procedure: DILATATION &  CURETTAGE/HYSTEROSCOPY WITH RESECTOCOPE;  Surgeon: Eldred Manges, MD;  Location: [place];  Service: Gynecology;  Laterality: N/A;  . THROAT SURGERY  1985   minimal uvulopalatopharyngoplasty for sleep apnea  . TUBAL LIGATION Bilateral 1981        Past Gynecological History:   GYNECOLOGIC HISTORY:  . No LMP recorded. Patient is postmenopausal. in her late 62's . Menarche: 82-20 years old . P 2 . Contraceptive 3 years OCP . HRT None  . Last Pap states done 12/2017 however [facility] notes mention 10/2017 normal with neg HPV Family Hx:  Family History  Problem Relation Age of Onset  . Heart disease Sister    Social Hx:  Marland Kitchen Tobacco use: none . Alcohol use: 2 times per year . Illicit Drug use: none . Illicit IV Drug use: none    Review of Systems: Review of Systems  Constitutional: Positive for appetite change.  All other systems reviewed and are negative.   Vitals: Blood pressure 120/70,  pulse (!) 56, temperature 98 F (36.7 C), temperature source Oral, resp. rate 18, height '5\' 3"'  (1.6 m), weight 208 lb (94.3 kg), SpO2 98 %. Vitals:   07/13/18 0929  Weight: 208 lb (94.3 kg)  Height: '5\' 3"'  (1.6 m)    Vitals:   07/13/18 0929 07/13/18 0948  BP: (!) 163/76 120/70  Pulse: (!) 56   Resp: 18   Temp: 98 F (36.7 C)   SpO2: 98%    Body mass index is 36.85 kg/m.   Physical Exam: General :  Well developed, 67 y.o., female in no apparent distress HEENT:  Normocephalic/atraumatic, symmetric, EOMI, eyelids normal Neck:   Supple, no masses.  Lymphatics:  No cervical/ submandibular/ supraclavicular/ infraclavicular/ inguinal adenopathy Respiratory:  Respirations unlabored, no use of accessory muscles CV:   Deferred Breast:  Deferred Musculoskeletal: No CVA tenderness, normal muscle strength. Abdomen:  Soft, non-tender and nondistended. No evidence of hernia. No masses. Extremities:  No lymphedema, no erythema, non-tender. Skin:   Normal  inspection Neuro/Psych:  No focal motor deficit, no abnormal mental status. Normal gait. Normal affect. Alert and oriented to person, place, and time  Genito Urinary: Vulva: Normal external female genitalia.  Bladder/urethra: Urethral meatus normal in size and location. No lesions or   masses, well supported bladder Speculum exam: Vagina: No lesion, no discharge, no bleeding. Cervix: Normal appearing, no lesions. Bimanual exam:  Uterus: Normal size, mobile.  Adnexa: No masses. Rectovaginal:  Good tone, no masses, no cul de sac nodularity, no parametrial involvement or nodularity.   Assessment  Elevated testosterone unknown etiology  Plan  1. Data reviewed ? I reviewed the radiology reports ? Notation is made specifically of no pelvic mass and normal postmenopausal appearing ovaries bilaterally. ? I reviewed her referring doctor's office notes and I have summarized in the HPI ? History was obtained from the patient mostly and partly from the chart ? We reviewed her elevated testosterone levels. While this can be a marker in sex-cord stromal tumors there is no visible abnormality in her ovaries. 2. Recommendations ? PMB per Dr. Leo Grosser ? Last bleed was 03/2018; last D&C ? Year before that by [facility] records. MRI shows EM 47m. If repeat PMB could offer repeat sampling. ? Keep MRI as planned for September as noted in Dr. HCaralee Atesplan ? I will refer the patient to Endocrinology  ? One to see if they need to workup anything else or can help elucidate the etiology ? Two to see their level of concern about the elevated testosterone  ? Three to see if BSO recommended  3. If any findings on followup MRI or if Endocrine recommends BSO we are happy to see her back. Otherwise I will disposition back to Dr. HLeo Grosser    Face to face time with patient was 60 minutes. Over 50% of this time was spent on counseling and coordination of care.  SIsabel Caprice MD  07/17/2018, 3:56  PM    Cc: VKendall Flack MD (Referring Ob/Gyn) BElby Showers MD (PCP)

## 2018-07-13 NOTE — Patient Instructions (Addendum)
1. We will refer you to endocrinology. If they feel you would benefit from ovary removal due to your testosterone elevation please call us for follow-up. 2. Otherwise follow-up with Dr. Leo Grosser for any vaginal spotting. 3. Keep the MRI appointment Dr. Leo Grosser set for you in September

## 2018-07-17 ENCOUNTER — Encounter: Payer: Self-pay | Admitting: Obstetrics

## 2018-07-18 NOTE — Progress Notes (Signed)
Abnormal SPEP not thought to be myeloma. Has seen Oncologist

## 2018-07-20 ENCOUNTER — Telehealth: Payer: Self-pay | Admitting: Oncology

## 2018-07-20 NOTE — Telephone Encounter (Signed)
[facility] regarding patient's referral.  They said the doctor's are reviewing her case and then she will be scheduled.

## 2018-08-04 ENCOUNTER — Telehealth: Payer: Self-pay | Admitting: Oncology

## 2018-08-04 NOTE — Telephone Encounter (Signed)
[facility] about patient's referral.  They said the doctors are still reviewing patients records.

## 2018-09-05 ENCOUNTER — Telehealth: Payer: Self-pay | Admitting: Internal Medicine

## 2018-09-05 ENCOUNTER — Encounter: Payer: Self-pay | Admitting: Internal Medicine

## 2018-09-05 ENCOUNTER — Other Ambulatory Visit: Payer: Medicare Other | Admitting: Internal Medicine

## 2018-09-05 NOTE — Telephone Encounter (Signed)
Attempted to call pt about missed lab appt this am. Has appt for 6 month followup October 10th and needed labs drawn in advance. Unable to leave message as voice mail not set up.

## 2018-09-06 ENCOUNTER — Other Ambulatory Visit: Payer: Medicare Other | Admitting: Internal Medicine

## 2018-09-06 DIAGNOSIS — E119 Type 2 diabetes mellitus without complications: Secondary | ICD-10-CM

## 2018-09-06 DIAGNOSIS — Z79899 Other long term (current) drug therapy: Principal | ICD-10-CM

## 2018-09-06 DIAGNOSIS — E782 Mixed hyperlipidemia: Secondary | ICD-10-CM

## 2018-09-06 DIAGNOSIS — Z5181 Encounter for therapeutic drug level monitoring: Secondary | ICD-10-CM

## 2018-09-06 NOTE — Addendum Note (Signed)
Addended by: Mady Haagensen on: 09/06/2018 10:09 AM   Modules accepted: Orders

## 2018-09-07 ENCOUNTER — Encounter: Payer: Self-pay | Admitting: Endocrinology

## 2018-09-07 LAB — LIPID PANEL
CHOL/HDL RATIO: 2.8 (calc) (ref <5.0)
CHOLESTEROL: 142 mg/dL (ref <200)
HDL: 51 mg/dL (ref >50)
LDL Cholesterol (Calc): 73 mg/dL (calc)
NON-HDL CHOLESTEROL (CALC): 91 mg/dL (ref <130)
TRIGLYCERIDES: 94 mg/dL (ref <150)

## 2018-09-07 LAB — HEPATIC FUNCTION PANEL
AG RATIO: 0.9 (calc) — AB (ref 1.0–2.5)
ALKALINE PHOSPHATASE (APISO): 37 U/L (ref 33–130)
ALT: 14 U/L (ref 6–29)
AST: 17 U/L (ref 10–35)
Albumin: 3.9 g/dL (ref 3.6–5.1)
BILIRUBIN INDIRECT: 0.3 mg/dL (ref 0.2–1.2)
Bilirubin, Direct: 0.1 mg/dL (ref 0.0–0.2)
GLOBULIN: 4.3 g/dL — AB (ref 1.9–3.7)
TOTAL PROTEIN: 8.2 g/dL — AB (ref 6.1–8.1)
Total Bilirubin: 0.4 mg/dL (ref 0.2–1.2)

## 2018-09-07 LAB — MICROALBUMIN / CREATININE URINE RATIO
Creatinine, Urine: 200 mg/dL (ref 20–275)
Microalb Creat Ratio: 15 mcg/mg creat (ref <30)
Microalb, Ur: 2.9 mg/dL

## 2018-09-07 LAB — HEMOGLOBIN A1C
EAG (MMOL/L): 9.3 (calc)
Hgb A1c MFr Bld: 7.5 % of total Hgb — ABNORMAL HIGH (ref <5.7)
MEAN PLASMA GLUCOSE: 169 (calc)

## 2018-09-08 ENCOUNTER — Ambulatory Visit (INDEPENDENT_AMBULATORY_CARE_PROVIDER_SITE_OTHER): Payer: Medicare Other | Admitting: Internal Medicine

## 2018-09-08 ENCOUNTER — Encounter: Payer: Self-pay | Admitting: Internal Medicine

## 2018-09-08 VITALS — BP 130/82 | HR 82 | Temp 98.2°F | Ht 63.0 in | Wt 211.0 lb

## 2018-09-08 DIAGNOSIS — E119 Type 2 diabetes mellitus without complications: Secondary | ICD-10-CM

## 2018-09-08 DIAGNOSIS — E8881 Metabolic syndrome: Secondary | ICD-10-CM

## 2018-09-08 DIAGNOSIS — I10 Essential (primary) hypertension: Secondary | ICD-10-CM | POA: Diagnosis not present

## 2018-09-08 DIAGNOSIS — E7849 Other hyperlipidemia: Secondary | ICD-10-CM

## 2018-09-08 DIAGNOSIS — R779 Abnormality of plasma protein, unspecified: Secondary | ICD-10-CM

## 2018-09-08 DIAGNOSIS — K862 Cyst of pancreas: Secondary | ICD-10-CM | POA: Diagnosis not present

## 2018-09-08 DIAGNOSIS — R778 Other specified abnormalities of plasma proteins: Secondary | ICD-10-CM | POA: Diagnosis not present

## 2018-09-08 MED ORDER — ESOMEPRAZOLE MAGNESIUM 40 MG PO CPDR: 40.0000 mg | DELAYED_RELEASE_CAPSULE | Freq: Every day | ORAL | 3 refills | Status: DC

## 2018-09-08 NOTE — Progress Notes (Signed)
   Subjective:    Patient ID: Nancy Wagner, female    DOB: 04-Mar-1951, 67 y.o.   MRN: [id number]  HPI 67 year old Black Female in today for 26-month recheck.  She feels well and has no new complaints.  She was seen at [facility] by Dr. Lebron Conners in May regarding abnormal SPEP.  She had bone survey that showed no lytic or blastic lesions.  Significant IgA and IgM gammopathy.  Thought maybe to have chronic infection or autoimmune condition.  Also was evaluated by endocrinologist for elevated testosterone level.  Free testosterone was normal.  Had colonoscopy in June by Dr. Ardis Hughs which was normal.  CA 1 9-9 was slightly elevated at 36 in March.  Hemoglobin A1c in March was 7.3%.  Now hemoglobin A1c is 7.5%.  Lipid panel is normal and liver functions are normal.  Blood pressure stable at 130/82    Review of Systems     Objective:   Physical Exam Skin warm and dry.  No cervical adenopathy.  Neck is supple.  Chest clear.  Cardiac exam regular rate and rhythm.  No lower extremity edema.       Assessment & Plan:  Essential hypertension-stable on current regimen  Hyperlipidemia-stable  Type 2 diabetes mellitus-control could be a bit better.  Needs to watch diet and exercise and continue same medications.  Abnormal SPEP-we will need to follow.  Work-up for myeloma was negative.  Currently does not really exhibit any rheumatology or inflammatory conditions.  LDH was normal.  Hematology recommended evaluation for lupus rheumatoid arthritis or chronic infections.  We have not done that as of yet.  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  Mild elevation of CA 19-9  Follow-up spring 2020 for physical exam.  Hirsutism and mild elevation of testosterone but normal free testosterone evaluated by endocrinology  25 minutes spent with patient

## 2018-09-20 ENCOUNTER — Ambulatory Visit (INDEPENDENT_AMBULATORY_CARE_PROVIDER_SITE_OTHER): Payer: Medicare Other | Admitting: Endocrinology

## 2018-09-20 ENCOUNTER — Encounter: Payer: Self-pay | Admitting: Endocrinology

## 2018-09-20 DIAGNOSIS — R7989 Other specified abnormal findings of blood chemistry: Secondary | ICD-10-CM | POA: Diagnosis not present

## 2018-09-20 LAB — CORTISOL: Cortisol, Plasma: 4.4 ug/dL

## 2018-09-20 NOTE — Patient Instructions (Addendum)
blood tests are requested for you today.  We'll let you know about the results. I would be happy to see you back here as needed.   

## 2018-09-20 NOTE — Progress Notes (Signed)
Subjective:    Patient ID: Nancy Wagner, female    DOB: 01-27-1951, 67 y.o.   MRN: [id number]  HPI Pt is referred by Dr Gerarda Fraction, for polycystic ovary syndrome.  Pt had menarche at age 30.  She has always had regular menses. She is G3P2.  She reported slight intermitt bleeding from the vagina, and assoc acne.  GYN checked testosterone.  She has never been on androgen rx.   Past Medical History:  Diagnosis Date  . Asthma    controlled  . Cervical stenosis (uterine cervix)   . GERD (gastroesophageal reflux disease)    diet control  . Hypertension   . Lichen simplex chronicus   . OA (osteoarthritis)   . PMB (postmenopausal bleeding)   . Type 2 diabetes mellitus ([place])   . Uterine fibroid     Past Surgical History:  Procedure Laterality Date  . [place]  . CHOLECYSTECTOMY OPEN  1984  . DILATATION & CURRETTAGE/HYSTEROSCOPY WITH RESECTOCOPE N/A 04/07/2017   Procedure: DILATATION & CURETTAGE/HYSTEROSCOPY WITH RESECTOCOPE;  Surgeon: Eldred Manges, MD;  Location: [facility];  Service: Gynecology;  Laterality: N/A;  . THROAT SURGERY  1985   minimal uvulopalatopharyngoplasty for sleep apnea  . TUBAL LIGATION Bilateral 1981    Social History   Socioeconomic History  . Marital status: Married    Spouse name: Not on file  . Number of children: 2  . Years of education: Not on file  . Highest education level: Not on file  Occupational History  . Not on file  Social Needs  . Financial resource strain: Not on file  . Food insecurity:    Worry: Not on file    Inability: Not on file  . Transportation needs:    Medical: Not on file    Non-medical: Not on file  Tobacco Use  . Smoking status: Never Smoker  . Smokeless tobacco: Never Used  Substance and Sexual Activity  . Alcohol use: Yes    Comment: 2 times per year  . Drug use: No  . Sexual activity: Not on file  Lifestyle  . Physical activity:    Days per week: Not on file    Minutes  per session: Not on file  . Stress: Not on file  Relationships  . Social connections:    Talks on phone: Not on file    Gets together: Not on file    Attends religious service: Not on file    Active member of club or organization: Not on file    Attends meetings of clubs or organizations: Not on file    Relationship status: Not on file  . Intimate partner violence:    Fear of current or ex partner: Not on file    Emotionally abused: Not on file    Physically abused: Not on file    Forced sexual activity: Not on file  Other Topics Concern  . Not on file  Social History Narrative  . Not on file    Current Outpatient Medications on File Prior to Visit  Medication Sig Dispense Refill  . albuterol (PROVENTIL HFA;VENTOLIN HFA) 108 (90 Base) MCG/ACT inhaler Inhale 2 puffs into the lungs every 6 (six) hours as needed for wheezing or shortness of breath. 1 Inhaler 2  . amLODipine (NORVASC) 5 MG tablet TAKE 1 TABLET BY MOUTH EVERY DAY 90 tablet 1  . atenolol (TENORMIN) 100 MG tablet TAKE 1 TABLET (100 MG TOTAL) BY MOUTH DAILY. 90 tablet  3  . cetirizine (ZYRTEC) 10 MG tablet Take 10 mg by mouth as needed.    . cloNIDine (CATAPRES) 0.1 MG tablet One po bid 180 tablet 3  . esomeprazole (NEXIUM) 40 MG capsule Take 1 capsule (40 mg total) by mouth daily. 90 capsule 3  . fish oil-omega-3 fatty acids 1000 MG capsule Take 2 g by mouth daily. Reported on 06/18/2016    . fluocinonide (LIDEX) 0.05 % external solution Apply 6.76 application topically as needed.  1  . folic acid (FOLVITE) 720 MCG tablet Take 400 mcg by mouth daily.    Marland Kitchen ibuprofen (ADVIL,MOTRIN) 600 MG tablet Ibuprofen 600 mg every 6 hours around-the-clock for 3 days and then every 6 hours as needed for pain 30 tablet 0  . metFORMIN (GLUCOPHAGE) 500 MG tablet TAKE 1 TABLET 3 TIMES A DAY BEFORE MEALS 270 tablet PRN  . olmesartan-hydrochlorothiazide (BENICAR HCT) 40-25 MG tablet TAKE 1 TABLET BY MOUTH EVERY DAY 90 tablet 1  . repaglinide  (PRANDIN) 2 MG tablet TAKE 1 TABLET 3 TIMES A DAY BEFORE MEALS 270 tablet 1  . simvastatin (ZOCOR) 20 MG tablet TAKE 1 TABLET (20 MG TOTAL) BY MOUTH DAILY. 90 tablet 3  . tretinoin (RETIN-A) 0.05 % cream Apply 9.47 application topically daily.  4  . vitamin B-12 (CYANOCOBALAMIN) 100 MCG tablet Take 100 mcg by mouth daily.    . vitamin C (ASCORBIC ACID) 500 MG tablet Take 500 mg by mouth daily.    . vitamin E 400 UNIT capsule Take 400 Units by mouth daily.    . calcium-vitamin D (OSCAL WITH D) 500-200 MG-UNIT per tablet Take 1 tablet by mouth daily.     Current Facility-Administered Medications on File Prior to Visit  Medication Dose Route Frequency Provider Last Rate Last Dose  . 0.9 %  sodium chloride infusion  500 mL Intravenous Once Milus Banister, MD        Allergies  Allergen Reactions  . Adhesive [Tape] Rash    Band-Aid  . Codeine Hives, Itching and Rash    Family History  Problem Relation Age of Onset  . Heart disease Sister     BP 140/78   Pulse 63   Ht 5\' 3"  (1.6 m)   Wt 210 lb (95.3 kg)   SpO2 96%   BMI 37.20 kg/m     Review of Systems Denies galactorrhea, sob, excessive sweating, hair loss on the head, diplopia, depression, cold intolerance, numbness, voice change, fatigue, insomnia, change in skin tone, easy bruising, hirsutism, pelvic cramps, and dry skin. She has slight leg swelling, pelvic pain, and weight gain.      Objective:   Physical Exam VS: see vs page GEN: no distress HEAD: head: no deformity.  No terminal hair on the face.  No alopecia.  No acne is seen  eyes: no periorbital swelling, no proptosis external nose and ears are normal mouth: no lesion seen NECK: supple, thyroid is not enlarged CHEST WALL: no deformity LUNGS: clear to auscultation CV: reg rate and rhythm, no murmur ABD: abdomen is soft, nontender.  no hepatosplenomegaly.  not distended.  no hernia MUSCULOSKELETAL: muscle bulk and strength are grossly normal.  no obvious joint  swelling.  gait is normal and steady EXTEMITIES: no deformity.  no ulcer on the feet.  feet are of normal color and temp.  Trace bilat leg edema PULSES: dorsalis pedis intact bilat.  no carotid bruit NEURO:  cn 2-12 grossly intact.   readily moves all 4's.  sensation is intact to touch on the feet SKIN:  Normal texture and temperature.  No rash or suspicious lesion is visible.   NODES:  None palpable at the neck PSYCH: alert, well-oriented.  Does not appear anxious nor depressed.     Lab Results  Component Value Date   TSH 2.20 02/21/2018   MRI: no ovarian or adrenal mass.  I have reviewed outside records, and summarized: Pt was noted to have elevated testosterone, and referred here.  Oophorectomy is considered as rx, if elev testosterone is confirmed.       Assessment & Plan:  elev testosterone, new to me.  uncertain etiology.  I agree that is this is confirmed, BSO should be considered, even with normal imaging.  Patient Instructions  blood tests are requested for you today.  We'll let you know about the results. I would be happy to see you back here as needed

## 2018-09-21 LAB — LUTEINIZING HORMONE: LH: 20.45 m[IU]/mL

## 2018-09-21 LAB — FOLLICLE STIMULATING HORMONE: FSH: 28.8 m[IU]/mL

## 2018-09-22 LAB — TESTOSTERONE,FREE AND TOTAL
TESTOSTERONE FREE: 1.6 pg/mL (ref 0.0–4.2)
TESTOSTERONE: 236 ng/dL — AB (ref 3–41)

## 2018-09-23 ENCOUNTER — Other Ambulatory Visit: Payer: Self-pay | Admitting: Internal Medicine

## 2018-09-23 NOTE — Telephone Encounter (Signed)
Verbal order by Dr. Renold Genta to refill Amlodipine 5 mg.  Take 1 tablet by mouth every day.  #90.  Refills 3.  Spoke with [name] @ [facility] in [place] @ [phone].

## 2018-09-24 ENCOUNTER — Other Ambulatory Visit: Payer: Self-pay | Admitting: Internal Medicine

## 2018-09-28 LAB — ESTRADIOL, FREE
ESTRADIOL FREE: 0.39 pg/mL
ESTRADIOL: 20 pg/mL

## 2018-09-28 LAB — DHEA-SULFATE: DHEA SO4: 28 ug/dL (ref 12–133)

## 2018-09-29 NOTE — Patient Instructions (Signed)
Follow-up in spring 2020 for physical exam and continue current medications at present time.

## 2018-10-18 ENCOUNTER — Other Ambulatory Visit: Payer: Self-pay | Admitting: Internal Medicine

## 2018-11-07 ENCOUNTER — Ambulatory Visit (INDEPENDENT_AMBULATORY_CARE_PROVIDER_SITE_OTHER): Payer: Medicare Other | Admitting: Internal Medicine

## 2018-11-07 ENCOUNTER — Encounter: Payer: Self-pay | Admitting: Internal Medicine

## 2018-11-07 VITALS — BP 140/90 | HR 68 | Temp 98.2°F | Ht 63.0 in | Wt 205.0 lb

## 2018-11-07 DIAGNOSIS — M5431 Sciatica, right side: Secondary | ICD-10-CM

## 2018-11-07 MED ORDER — PREDNISONE 10 MG PO TABS: ORAL_TABLET | ORAL | 0 refills | Status: DC

## 2018-11-07 MED ORDER — CYCLOBENZAPRINE HCL 10 MG PO TABS: ORAL_TABLET | ORAL | 0 refills | Status: DC

## 2018-11-07 NOTE — Progress Notes (Signed)
   Subjective:    Patient ID: Nancy Wagner, female    DOB: 01-18-1951, 67 y.o.   MRN: [id number]  HPI 67 year old Female in today with low back pain.  Does not recall any specific injury.  Pain is rather severe.  Cannot get any relief.  History of hypertension.  Blood pressure is 140/90 today but patient has pain  Medical problems include diabetes mellitus, osteoarthritis of left knee in addition to hypertension.  She has mixed hyperlipidemia.  Review of Systems     Objective:   Physical Exam Straight leg raising is negative at 90 degrees bilaterally.  Muscle strength is normal in the lower extremities.  Pain is in her right buttock.  Sensation is intact in the right lower extremity.        Assessment & Plan:  Sciatica-right-sided  Plan: Prednisone 10 mg tablets going from 60 mg to 0 mg over 7 days.  May need to see physical therapist if symptoms persist.  May take half of a 10 mg Flexeril tablet at bedtime.  Call if not better in 2 weeks.  15 minutes spent with patient including assessment of back pain and counseling regarding treatment and follow-up

## 2018-11-07 NOTE — Patient Instructions (Signed)
Take prednisone in tapering course as directed 6-5-4-3-2-1. Take one half of 10 mg Flexeril tablet at bedtime. Call in not better in 2 weeks.

## 2018-11-29 ENCOUNTER — Encounter: Payer: Self-pay | Admitting: Internal Medicine

## 2018-12-07 ENCOUNTER — Telehealth: Payer: Self-pay | Admitting: Gastroenterology

## 2018-12-07 NOTE — Telephone Encounter (Signed)
The pt has been advised that we will call her to set up MRI in February.The pt has been advised of the information and verbalized understanding.

## 2018-12-09 ENCOUNTER — Other Ambulatory Visit: Payer: Self-pay | Admitting: Internal Medicine

## 2018-12-22 ENCOUNTER — Other Ambulatory Visit: Payer: Self-pay | Admitting: Internal Medicine

## 2018-12-22 ENCOUNTER — Other Ambulatory Visit: Payer: Self-pay | Admitting: Gastroenterology

## 2018-12-22 DIAGNOSIS — K862 Cyst of pancreas: Secondary | ICD-10-CM

## 2019-01-03 ENCOUNTER — Other Ambulatory Visit: Payer: Self-pay | Admitting: Internal Medicine

## 2019-01-10 ENCOUNTER — Other Ambulatory Visit: Payer: Self-pay | Admitting: Gastroenterology

## 2019-01-10 ENCOUNTER — Ambulatory Visit (HOSPITAL_COMMUNITY)
Admission: RE | Admit: 2019-01-10 | Discharge: 2019-01-10 | Disposition: A | Discharge status: Home / Self Care | Payer: Medicare Other | Source: Ambulatory Visit | Attending: Gastroenterology | Admitting: Gastroenterology

## 2019-01-10 DIAGNOSIS — K862 Cyst of pancreas: Secondary | ICD-10-CM

## 2019-01-10 MED ORDER — GADOBUTROL 1 MMOL/ML IV SOLN
9.0000 mL | Freq: Once | INTRAVENOUS | Status: AC | PRN
  Administered 2019-01-10: 9 mL via INTRAVENOUS

## 2019-01-11 LAB — POCT I-STAT CREATININE: CREATININE: 1.1 mg/dL — AB (ref 0.44–1.00)

## 2019-01-26 ENCOUNTER — Other Ambulatory Visit: Payer: Self-pay | Admitting: Internal Medicine

## 2019-01-26 DIAGNOSIS — Z1231 Encounter for screening mammogram for malignant neoplasm of breast: Secondary | ICD-10-CM

## 2019-01-28 ENCOUNTER — Other Ambulatory Visit: Payer: Self-pay | Admitting: Internal Medicine

## 2019-02-21 ENCOUNTER — Other Ambulatory Visit: Payer: Self-pay | Admitting: Internal Medicine

## 2019-02-21 ENCOUNTER — Ambulatory Visit: Payer: Medicare Other

## 2019-02-23 ENCOUNTER — Other Ambulatory Visit: Payer: Self-pay | Admitting: Internal Medicine

## 2019-02-23 NOTE — Telephone Encounter (Signed)
Why does pt need this refill?

## 2019-02-24 ENCOUNTER — Telehealth: Payer: Self-pay | Admitting: Internal Medicine

## 2019-02-24 ENCOUNTER — Encounter: Payer: Self-pay | Admitting: Internal Medicine

## 2019-02-24 ENCOUNTER — Other Ambulatory Visit: Payer: Self-pay

## 2019-02-24 ENCOUNTER — Other Ambulatory Visit: Payer: Self-pay | Admitting: Internal Medicine

## 2019-02-24 ENCOUNTER — Ambulatory Visit (INDEPENDENT_AMBULATORY_CARE_PROVIDER_SITE_OTHER): Payer: Medicare Other | Admitting: Internal Medicine

## 2019-02-24 DIAGNOSIS — M109 Gout, unspecified: Secondary | ICD-10-CM

## 2019-02-24 DIAGNOSIS — Z8739 Personal history of other diseases of the musculoskeletal system and connective tissue: Secondary | ICD-10-CM

## 2019-02-24 MED ORDER — INDOMETHACIN 50 MG PO CAPS: 50.0000 mg | ORAL_CAPSULE | Freq: Three times a day (TID) | ORAL | 0 refills | Status: DC

## 2019-02-24 MED ORDER — DOXYCYCLINE HYCLATE 100 MG PO TABS: 100.0000 mg | ORAL_TABLET | Freq: Two times a day (BID) | ORAL | 0 refills | Status: DC

## 2019-02-24 NOTE — Progress Notes (Signed)
   Subjective:    Patient ID: Nancy Wagner, female    DOB: 1951-01-22, 68 y.o.   MRN: [id number]  HPI 68 year old Black Female called with complaint of swelling right first MTP joint onset yesterday.  Interactive audio and video telecommunications were attempted between provider and patient however failed due to technical difficulties.  We continued and completed visits with audio only.  She had a similar episode in August 2018.  Her uric acid was elevated at 8.5 at that time.  She was treated with doxycycline and Indocin and improved.  She states that this is just like the last episode.  She is currently not on allopurinol.  She says she does not have a fever.  She says toe is tender to touch and flex.    Review of Systems see above- denies fever chills myalgias or shortness of breath     Objective:   Physical Exam  See above-not able to connect by video      Assessment & Plan:  Acute gouty arthritis versus cellulitis right MTP joint  Plan: Indocin 50 mg 3 times a day with food for 10 days.  Doxycycline 100 mg twice daily for 10 days.

## 2019-02-24 NOTE — Telephone Encounter (Signed)
Nancy Wagner [phone]  Rip Harbour called to say that her R big toe is swollen and hurting just like last time. It hurts to touch. She stated that last time it was because her Uric Acid was up, and gout.

## 2019-02-24 NOTE — Patient Instructions (Signed)
Indocin 50 mg 3 times a day for 10 days.  Doxycycline 100 mg twice daily for 10 days.

## 2019-02-24 NOTE — Telephone Encounter (Signed)
Virtual visit 

## 2019-02-27 ENCOUNTER — Other Ambulatory Visit: Payer: Medicare Other | Admitting: Internal Medicine

## 2019-03-02 ENCOUNTER — Encounter: Payer: Medicare Other | Admitting: Internal Medicine

## 2019-03-03 ENCOUNTER — Other Ambulatory Visit: Payer: Self-pay | Admitting: Internal Medicine

## 2019-03-22 ENCOUNTER — Ambulatory Visit: Payer: Medicare Other

## 2019-03-24 ENCOUNTER — Other Ambulatory Visit: Payer: Self-pay | Admitting: Internal Medicine

## 2019-04-22 ENCOUNTER — Other Ambulatory Visit: Payer: Self-pay | Admitting: Internal Medicine

## 2019-05-03 LAB — HM DIABETES EYE EXAM

## 2019-05-04 ENCOUNTER — Encounter: Payer: Self-pay | Admitting: Internal Medicine

## 2019-05-18 ENCOUNTER — Other Ambulatory Visit: Payer: Self-pay | Admitting: Internal Medicine

## 2019-05-22 ENCOUNTER — Other Ambulatory Visit: Payer: Self-pay | Admitting: Internal Medicine

## 2019-05-22 ENCOUNTER — Other Ambulatory Visit: Payer: Self-pay

## 2019-05-22 ENCOUNTER — Other Ambulatory Visit (INDEPENDENT_AMBULATORY_CARE_PROVIDER_SITE_OTHER): Payer: Medicare Other | Admitting: Internal Medicine

## 2019-05-22 DIAGNOSIS — I10 Essential (primary) hypertension: Secondary | ICD-10-CM

## 2019-05-22 DIAGNOSIS — E119 Type 2 diabetes mellitus without complications: Secondary | ICD-10-CM

## 2019-05-22 DIAGNOSIS — R8290 Unspecified abnormal findings in urine: Secondary | ICD-10-CM

## 2019-05-22 DIAGNOSIS — E1169 Type 2 diabetes mellitus with other specified complication: Secondary | ICD-10-CM

## 2019-05-22 DIAGNOSIS — Z0000 Encounter for general adult medical examination without abnormal findings: Secondary | ICD-10-CM | POA: Diagnosis not present

## 2019-05-22 DIAGNOSIS — Z8639 Personal history of other endocrine, nutritional and metabolic disease: Secondary | ICD-10-CM

## 2019-05-22 DIAGNOSIS — E7849 Other hyperlipidemia: Secondary | ICD-10-CM

## 2019-05-22 LAB — POCT URINALYSIS DIPSTICK
Bilirubin, UA: NEGATIVE
Blood, UA: NEGATIVE
Glucose, UA: NEGATIVE
Ketones, UA: NEGATIVE
Nitrite, UA: NEGATIVE
Protein, UA: NEGATIVE
Spec Grav, UA: 1.01 (ref 1.010–1.025)
Urobilinogen, UA: 0.2 E.U./dL
pH, UA: 6.5 (ref 5.0–8.0)

## 2019-05-22 NOTE — Addendum Note (Signed)
Addended by: Virl Cagey on: 05/22/2019 11:49 AM   Modules accepted: Orders

## 2019-05-22 NOTE — Addendum Note (Signed)
Addended by: Virl Cagey on: 05/22/2019 12:01 PM   Modules accepted: Orders

## 2019-05-24 LAB — COMPREHENSIVE METABOLIC PANEL
AG Ratio: 0.9 (calc) — ABNORMAL LOW (ref 1.0–2.5)
ALT: 20 U/L (ref 6–29)
AST: 20 U/L (ref 10–35)
Albumin: 3.9 g/dL (ref 3.6–5.1)
Alkaline phosphatase (APISO): 45 U/L (ref 37–153)
BUN/Creatinine Ratio: 17 (calc) (ref 6–22)
BUN: 18 mg/dL (ref 7–25)
CO2: 29 mmol/L (ref 20–32)
Calcium: 10 mg/dL (ref 8.6–10.4)
Chloride: 97 mmol/L — ABNORMAL LOW (ref 98–110)
Creat: 1.07 mg/dL — ABNORMAL HIGH (ref 0.50–0.99)
Globulin: 4.3 g/dL (calc) — ABNORMAL HIGH (ref 1.9–3.7)
Glucose, Bld: 199 mg/dL — ABNORMAL HIGH (ref 65–99)
Potassium: 3.7 mmol/L (ref 3.5–5.3)
Sodium: 137 mmol/L (ref 135–146)
Total Bilirubin: 0.5 mg/dL (ref 0.2–1.2)
Total Protein: 8.2 g/dL — ABNORMAL HIGH (ref 6.1–8.1)

## 2019-05-24 LAB — CBC WITH DIFFERENTIAL/PLATELET
Absolute Monocytes: 546 cells/uL (ref 200–950)
Basophils Absolute: 67 cells/uL (ref 0–200)
Basophils Relative: 0.8 %
Eosinophils Absolute: 403 cells/uL (ref 15–500)
Eosinophils Relative: 4.8 %
HCT: 41.2 % (ref 35.0–45.0)
Hemoglobin: 13.2 g/dL (ref 11.7–15.5)
Lymphs Abs: 4158 cells/uL — ABNORMAL HIGH (ref 850–3900)
MCH: 29.3 pg (ref 27.0–33.0)
MCHC: 32 g/dL (ref 32.0–36.0)
MCV: 91.6 fL (ref 80.0–100.0)
MPV: 12.4 fL (ref 7.5–12.5)
Monocytes Relative: 6.5 %
Neutro Abs: 3226 cells/uL (ref 1500–7800)
Neutrophils Relative %: 38.4 %
Platelets: 218 10*3/uL (ref 140–400)
RBC: 4.5 10*6/uL (ref 3.80–5.10)
RDW: 13.2 % (ref 11.0–15.0)
Total Lymphocyte: 49.5 %
WBC: 8.4 10*3/uL (ref 3.8–10.8)

## 2019-05-24 LAB — HEMOGLOBIN A1C
Hgb A1c MFr Bld: 8.8 % of total Hgb — ABNORMAL HIGH (ref <5.7)
Mean Plasma Glucose: 206 (calc)
eAG (mmol/L): 11.4 (calc)

## 2019-05-24 LAB — URINE CULTURE
MICRO NUMBER:: 593406
SPECIMEN QUALITY:: ADEQUATE

## 2019-05-24 LAB — LIPID PANEL
Cholesterol: 176 mg/dL (ref <200)
HDL: 56 mg/dL (ref >=50)
LDL Cholesterol (Calc): 100 mg/dL (calc) — ABNORMAL HIGH
Non-HDL Cholesterol (Calc): 120 mg/dL (calc) (ref <130)
Total CHOL/HDL Ratio: 3.1 (calc) (ref <5.0)
Triglycerides: 106 mg/dL (ref <150)

## 2019-05-24 LAB — TSH: TSH: 2.48 mIU/L (ref 0.40–4.50)

## 2019-05-24 LAB — HOUSE ACCOUNT TRACKING

## 2019-05-26 ENCOUNTER — Other Ambulatory Visit: Payer: Self-pay

## 2019-05-26 ENCOUNTER — Ambulatory Visit (INDEPENDENT_AMBULATORY_CARE_PROVIDER_SITE_OTHER): Payer: Medicare Other | Admitting: Internal Medicine

## 2019-05-26 ENCOUNTER — Encounter: Payer: Self-pay | Admitting: Internal Medicine

## 2019-05-26 VITALS — BP 120/70 | Ht 63.0 in | Wt 203.0 lb

## 2019-05-26 DIAGNOSIS — R7309 Other abnormal glucose: Secondary | ICD-10-CM

## 2019-05-26 DIAGNOSIS — I10 Essential (primary) hypertension: Secondary | ICD-10-CM | POA: Diagnosis not present

## 2019-05-26 DIAGNOSIS — E1169 Type 2 diabetes mellitus with other specified complication: Secondary | ICD-10-CM | POA: Diagnosis not present

## 2019-05-26 DIAGNOSIS — E785 Hyperlipidemia, unspecified: Secondary | ICD-10-CM

## 2019-05-26 DIAGNOSIS — R8290 Unspecified abnormal findings in urine: Secondary | ICD-10-CM

## 2019-05-26 DIAGNOSIS — Z0000 Encounter for general adult medical examination without abnormal findings: Secondary | ICD-10-CM

## 2019-05-26 DIAGNOSIS — K862 Cyst of pancreas: Secondary | ICD-10-CM | POA: Diagnosis not present

## 2019-05-26 DIAGNOSIS — D890 Polyclonal hypergammaglobulinemia: Secondary | ICD-10-CM

## 2019-05-26 DIAGNOSIS — Z8739 Personal history of other diseases of the musculoskeletal system and connective tissue: Secondary | ICD-10-CM

## 2019-05-26 DIAGNOSIS — M1712 Unilateral primary osteoarthritis, left knee: Secondary | ICD-10-CM

## 2019-05-26 DIAGNOSIS — Z6836 Body mass index (BMI) 36.0-36.9, adult: Secondary | ICD-10-CM

## 2019-05-26 DIAGNOSIS — E8881 Metabolic syndrome: Secondary | ICD-10-CM

## 2019-05-26 LAB — POCT URINALYSIS DIPSTICK
Bilirubin, UA: NEGATIVE
Blood, UA: NEGATIVE
Glucose, UA: NEGATIVE
Ketones, UA: NEGATIVE
Nitrite, UA: NEGATIVE
Protein, UA: POSITIVE — AB
Spec Grav, UA: 1.01 (ref 1.010–1.025)
Urobilinogen, UA: 0.2 E.U./dL
pH, UA: 6 (ref 5.0–8.0)

## 2019-05-26 NOTE — Progress Notes (Signed)
Subjective:    Patient ID: Nancy Wagner, female    DOB: 10/10/1951, 68 y.o.   MRN: [id number]  HPI 68 year old Female for Medicare wellness, evaluation of medical issues.  History of elevated serum proteins.  Referred her to oncology in May 2019.  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  She is asymptomatic.  She had colonoscopy in June 2019 which was normal with 10-year follow-up recommended.  History of hypertension and obesity.  Blood pressure has been difficult to control and required several medications for control.  Patient was referred by Juanetta Snow.  She) he worked together at  [employer] where patient was employed as a Investment banker, corporate.  Patient is now retired.  She lives with her husband and is raising a granddaughter who is in high school.  She is intolerant of codeine it causes rash and hives.  Had pneumonia in 1977.  Had throat surgery by Dr. Ernesto Rutherford in 1982.  2 C-sections in [place].  Social history: Married.  Husband formerly worked as a Land but is now Brewing technologist at [employer].  Prior to the pandemic she went to the gym a couple of times a week.  Does not smoke or consume alcohol.  2 adult children a son and a daughter.  In 2012 she had left knee injected with steroids for osteoarthritis.  Family history: Father in his late 57s and mother in her 73s in good health.  4 brothers.  One brother died of complications of cancer.  4 sisters 3 of whom are in good health but one sister with history of MI and other health issues.    Review of Systems  Constitutional: Negative.   Respiratory: Negative.   Cardiovascular: Negative.   Gastrointestinal: Negative.   Neurological: Negative.   All other systems reviewed and are negative.      Objective:   Physical Exam Constitutional:      General: She is not in acute distress.     Appearance: Normal appearance. She is obese.  HENT:     Head: Normocephalic and atraumatic.     Right Ear: Tympanic membrane normal.     Left Ear: Tympanic membrane normal.     Nose: Nose normal.     Mouth/Throat:     Mouth: Mucous membranes are moist.     Pharynx: Oropharynx is clear.  Eyes:     General:        Right eye: No discharge.        Left eye: No discharge.     Conjunctiva/sclera: Conjunctivae normal.     Pupils: Pupils are equal, round, and reactive to light.  Neck:     Musculoskeletal: Neck supple. No neck rigidity.     Comments: No thyromegaly.  No carotid bruits. Cardiovascular:     Rate and Rhythm: Normal rate and regular rhythm.     Pulses: Normal pulses.     Heart sounds: Normal heart sounds. No murmur.  Pulmonary:     Effort: Pulmonary effort is normal.     Breath sounds: Normal breath sounds.     Comments: Breasts normal female without masses Abdominal:     General: Bowel sounds are normal. There is no distension.     Palpations: Abdomen is soft.     Tenderness: There is no abdominal tenderness. There is no guarding.  Genitourinary:    Comments: Deferred to GYN  Musculoskeletal:     Right lower leg: No edema.     Left lower leg: No edema.  Skin:    General: Skin is warm and dry.  Neurological:     General: No focal deficit present.     Mental Status: She is alert and oriented to person, place, and time.  Psychiatric:        Mood and Affect: Mood normal.        Behavior: Behavior normal.        Thought Content: Thought content normal.        Judgment: Judgment normal.           Assessment & Plan:  Obesity- BMI 35.96.  Recommend Dr. Migdalia Dk clinic.  To go to the gym before the pandemic but only went a couple of times a week   Essential hypertension difficult to control requiring multiple medications but excellent today at 120/70  History of IgA and IgM polyclonal gammopathy.  Seen by hematologist who did not think this was consistent with  process such as myeloma but more likely an autoimmune disease.  He has had a positive ANA and a positive rheumatoid factor.  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  Seen at [facility] and followed by Dr. Satira Sark.  Osteoarthritis left knee treated with NSAIDs  Metabolic syndrome  Hyperlipidemia-treated with simvastati   type 2 diabetes takes Glucophage and PrandiMet.  Hemoglobin A1c has increased from 7.3% a year ago to 8.8%.  Needs to get back on her diet and do more exercise.  May need endocrinology consultation.  Mild elevation of creatinine at 1.07.  Likely due to diabetes and hypertension.  Continue to follow.   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  Return in 3 months to follow-up on diabetes.  Needs to work on diet exercise and weight loss.  Subjective:   Patient presents for Medicare Annual/Subsequent preventive examination.  Review Past Medical/Family/Social: See above   Risk Factors  Current exercise habits: Used to go to gym before the pandemic Dietary issues discussed: Low-fat low-carb  Cardiac risk factors: Family history, hyperlipidemia and diabetes mellitus  Depression Screen  (Note: if answer to either of the following is "Yes", a more complete depression screening is indicated)   Over the past two weeks, have you felt down, depressed or hopeless? No  Over the past two weeks, have you felt little interest or pleasure in doing things? No Have you lost interest or pleasure in daily life? No Do you often feel hopeless? No Do you cry easily over simple problems? No   Activities of Daily Living  In your present state of health, do you have any difficulty performing the following activities?:   Driving? No  Managing money? No  Feeding yourself? No  Getting from bed to chair? No  Climbing a flight of stairs? No  Preparing food and eating?: No  Bathing or showering? No   Getting dressed: No  Getting to the toilet? No  Using the toilet:No  Moving around from place to place: No  In the past year have you fallen or had a near fall?:No  Are you sexually active? No  Do you have more than one partner? No   Hearing Difficulties: No  Do you often ask people to speak up or repeat themselves? No  Do you experience ringing or noises in your ears? No  Do you have difficulty understanding soft or whispered voices? No  Do you feel that you have a problem with memory? No Do you often misplace items? No    Home Safety:  Do you have a smoke alarm at your residence? Yes Do you have grab bars in the bathroom?  None Do you have throw rugs in your house?  Yes   Cognitive Testing  Alert? Yes Normal Appearance?Yes  Oriented to person? Yes Place? Yes  Time? Yes  Recall of three objects? Yes  Can perform simple calculations? Yes  Displays appropriate judgment?Yes  Can read the correct time from a watch face?Yes   List the Names of Other Physician/Practitioners you currently use:  See referral list for the physicians patient is currently seeing.     Review of Systems: See above   Objective:     General appearance: Appears stated age and obese  Head: Normocephalic, without obvious abnormality, atraumatic  Eyes: conj clear, EOMi PEERLA  Ears: normal TM's and external ear canals both ears  Nose: Nares normal. Septum midline. Mucosa normal. No drainage or sinus tenderness.  Throat: lips, mucosa, and tongue normal; teeth and gums normal  Neck: no adenopathy, no carotid bruit, no JVD, supple, symmetrical, trachea midline and thyroid not enlarged, symmetric, no tenderness/mass/nodules  No CVA tenderness.  Lungs: clear to auscultation bilaterally  Breasts: normal appearance, no masses or tenderness Heart: regular rate and rhythm, S1, S2 normal, no murmur, click, rub or gallop  Abdomen: soft, non-tender; bowel sounds normal; no masses, no organomegaly   Musculoskeletal: ROM normal in all joints, no crepitus, no deformity, Normal muscle strengthen. Back  is symmetric, no curvature. Skin: Skin color, texture, turgor normal. No rashes or lesions  Lymph nodes: Cervical, supraclavicular, and axillary nodes normal.  Neurologic: CN 2 -12 Normal, Normal symmetric reflexes. Normal coordination and gait  Psych: Alert & Oriented x 3, Mood appear stable.    Assessment:    Annual wellness medicare exam   Plan:    During the course of the visit the patient was educated and counseled about appropriate screening and preventive services including:    Recommend annual flu vaccine  Annual mammogram  Colonoscopy up-to-date    Patient Instructions (the written plan) was given to the patient.  Medicare Attestation  I have personally reviewed:  The patient's medical and social history  Their use of alcohol, tobacco or illicit drugs  Their current medications and supplements  The patient's functional ability including ADLs,fall risks, home safety risks, cognitive, and hearing and visual impairment  Diet and physical activities  Evidence for depression or mood disorders  The patient's weight, height, BMI, and visual acuity have been recorded in the chart. I have made referrals, counseling, and provided education to the patient based on review of the above and I have provided the patient with a written personalized care plan for preventive services.

## 2019-05-27 LAB — URINE CULTURE
MICRO NUMBER:: 612653
SPECIMEN QUALITY:: ADEQUATE

## 2019-06-18 NOTE — Patient Instructions (Signed)
Hemoglobin A1c is higher than I would like to see at 8.8%.  Work on diet exercise and weight loss and follow-up in 3 months.  Otherwise continue current medications.

## 2019-07-21 ENCOUNTER — Other Ambulatory Visit: Payer: Self-pay

## 2019-07-21 NOTE — Patient Outreach (Signed)
[facility]) Care Management  07/21/2019  Spoorthi Trimboli [birth year] [id number]   Medication Adherence call to Mrs. Ajwa Largo patient did not answer and mail box is not set up patient is past due on Metformin 500 mg under [place].  [facility] Direct Dial [phone]  Fax [phone] [email or web address]

## 2019-07-24 ENCOUNTER — Ambulatory Visit (INDEPENDENT_AMBULATORY_CARE_PROVIDER_SITE_OTHER): Payer: Medicare Other | Admitting: Internal Medicine

## 2019-07-24 ENCOUNTER — Telehealth: Payer: Self-pay

## 2019-07-24 ENCOUNTER — Other Ambulatory Visit: Payer: Self-pay

## 2019-07-24 ENCOUNTER — Encounter: Payer: Self-pay | Admitting: Internal Medicine

## 2019-07-24 VITALS — BP 140/78 | HR 72 | Temp 98.3°F | Ht 63.0 in | Wt 204.0 lb

## 2019-07-24 DIAGNOSIS — M546 Pain in thoracic spine: Secondary | ICD-10-CM | POA: Diagnosis not present

## 2019-07-24 MED ORDER — MELOXICAM 15 MG PO TABS: 15.0000 mg | ORAL_TABLET | Freq: Every day | ORAL | 0 refills | Status: DC

## 2019-07-24 MED ORDER — HYDROMORPHONE HCL 2 MG PO TABS: 2.0000 mg | ORAL_TABLET | Freq: Three times a day (TID) | ORAL | 0 refills | Status: AC

## 2019-07-24 NOTE — Telephone Encounter (Signed)
When I called patient back, she stated she has been taking 4 Advil a day and 1/2 Flexeril at night, she is able to sleep but when she gets up the pain is still there. She is wandering if it could be something to do with the spot on her pancreas. She stated she really needs to do something else the pain is just to much, with no relief.

## 2019-07-24 NOTE — Telephone Encounter (Signed)
Scheduled appointment

## 2019-07-24 NOTE — Progress Notes (Signed)
   Subjective:    Patient ID: Nancy Wagner, female    DOB: 10/27/1951, 68 y.o.   MRN: [id number]  HPI Patient says that 2 to 3 weeks ago she had an incident where the pole in her closet fell with many of clothes hanging on it. She was able to get some help with that but then began sorting her clothes and gave away a lot of them. This required some time.  She has subsequently developed pain around her lower thoracic back area.  Says it radiates bilaterally around to the abdominal area.  However she has no nausea vomiting fever or chills.  No UTI symptoms.  She is not walking very much for exercise.  Has been cooking a lot for her family.  Until we spoke about it today she did not really place the incident with the closet as perhaps an inciting event of her back pain.  Since the back pain is severe.  She has been taking Flexeril and Advil without relief.  The Flexeril allows her to sleep but she is taking 3-4 Advil at a time without much relief.    Review of Systems history of 6 mm cystic lesion in the pancreatic tail with 1 year of documented stability.  Last MR of the abdomen was February 2020 with plans to repeat study in 2 years.  She is followed by Dr. Ardis Hughs     Objective:   Physical Exam Blood pressure 140/78, pulse 72, temperature 98.3 weight 204 pounds BMI 36.14  She has some palpable tenderness in the lower thoracic rib cage area bilaterally.  No CVA tenderness.       Assessment & Plan:  My feeling is that this is musculoskeletal pain and not related at all to her pancreatic cyst.  She was reassured that we are going to treat her conservatively and see how things go.  She will be placed on meloxicam 15 mg daily.  For pain control which she says is rather severe I have given her Dilaudid 2 mg to take sparingly every 8 hours as needed pain.  If pain is not relieved in a couple of weeks we will try physical therapy before imaging is done.  If taking Dilaudid, does not have to take  Flexeril.  May try ice or heat for pain relief.  I would like for her to walk some outside as I think this may help

## 2019-07-24 NOTE — Telephone Encounter (Signed)
Patient called states she is been having back pain she said it is going on for 3 weeks now but last night she couldn't sleep and wants to know if she needs to come in? She tried cyclobenzaprine and it didn't help she said it just put her to sleep.

## 2019-07-24 NOTE — Telephone Encounter (Signed)
Back pain usually responds to rest. Advise trying Aleve or Advil taking a half tab of Flexeril at night instead of a whole one. We usually do not do much unless pain goes on for 10 days to 2 weeks. She may need physical therapy.

## 2019-07-24 NOTE — Patient Instructions (Signed)
Take Dilaudid sparingly for back pain.  Take meloxicam 15 mg daily.  Alternate heat or ice in the back area.  Try walking some outside.  If not getting better we will send you to physical therapy.

## 2019-07-24 NOTE — Telephone Encounter (Signed)
Book appt for this afternoon.

## 2019-08-14 ENCOUNTER — Other Ambulatory Visit: Payer: Self-pay

## 2019-08-14 ENCOUNTER — Ambulatory Visit
Admission: RE | Admit: 2019-08-14 | Discharge: 2019-08-14 | Disposition: A | Discharge status: Home / Self Care | Payer: Medicare Other | Source: Ambulatory Visit | Attending: Internal Medicine | Admitting: Internal Medicine

## 2019-08-14 DIAGNOSIS — Z1231 Encounter for screening mammogram for malignant neoplasm of breast: Secondary | ICD-10-CM

## 2019-08-25 ENCOUNTER — Other Ambulatory Visit: Payer: Medicare Other | Admitting: Internal Medicine

## 2019-08-25 ENCOUNTER — Other Ambulatory Visit: Payer: Self-pay

## 2019-08-25 DIAGNOSIS — E785 Hyperlipidemia, unspecified: Secondary | ICD-10-CM

## 2019-08-25 DIAGNOSIS — E1169 Type 2 diabetes mellitus with other specified complication: Secondary | ICD-10-CM

## 2019-08-25 LAB — HEMOGLOBIN A1C
Hgb A1c MFr Bld: 9.1 % of total Hgb — ABNORMAL HIGH (ref <5.7)
Mean Plasma Glucose: 214 (calc)
eAG (mmol/L): 11.9 (calc)

## 2019-08-28 ENCOUNTER — Other Ambulatory Visit: Payer: Medicare Other | Admitting: Internal Medicine

## 2019-09-01 ENCOUNTER — Ambulatory Visit: Payer: Medicare Other | Admitting: Internal Medicine

## 2019-09-01 ENCOUNTER — Ambulatory Visit (INDEPENDENT_AMBULATORY_CARE_PROVIDER_SITE_OTHER): Payer: Medicare Other | Admitting: Internal Medicine

## 2019-09-01 ENCOUNTER — Other Ambulatory Visit: Payer: Self-pay

## 2019-09-01 ENCOUNTER — Encounter: Payer: Self-pay | Admitting: Internal Medicine

## 2019-09-01 VITALS — BP 140/80 | HR 78 | Temp 98.4°F | Ht 63.0 in | Wt 203.0 lb

## 2019-09-01 DIAGNOSIS — E1165 Type 2 diabetes mellitus with hyperglycemia: Secondary | ICD-10-CM

## 2019-09-01 NOTE — Progress Notes (Addendum)
   Subjective:    Patient ID: Nancy Wagner, female    DOB: 11-Jun-1951, 69 y.o.   MRN: [id number]  HPI  68 year old Female for follow up on DM.  Has not been able to go to the gym due to the pandemic.  In October 2019 her A1c was 7.5%.  In June 2020 it was 8.8% and now it is 9.1%.  Discussion today regarding diet and exercise.  Has been taking Glucophage and Prandimet.  History of difficult to control hypertension, obesity, diabetes mellitus.    Review of Systems doing fairly well during the pandemic.  Granddaughters are doing well.     Objective:   Physical Exam Weight 203 pounds BMI 35.96 blood pressure 140/80, pulse 78, temperature 98.4 degrees pulse oximetry 98% weight 203 pounds BMI 35.96    Neck is supple without thyromegaly or carotid bruits.  Chest clear to auscultation.  Cardiac exam regular rate and rhythm.  Trace lower extremity edema       Assessment & Plan:  Poorly controlled diabetes mellitus  Essential hypertension-stable and hard to control  Plan: Samples of Janumet 50/500.  Stop PrandiMet.  Stop Glucophage.  Return in 2 weeks.  Keep Accu-Cheks at least twice daily.  And exercise recommended.

## 2019-09-01 NOTE — Patient Instructions (Signed)
Stop Prandin and Metformin. Accuchecks before breakfast and supper. 14 days of Janumet 50/1000 q am with breakfast. RTC in 2 weeks.

## 2019-09-15 ENCOUNTER — Other Ambulatory Visit: Payer: Self-pay

## 2019-09-15 ENCOUNTER — Ambulatory Visit: Payer: Medicare Other | Admitting: Internal Medicine

## 2019-09-15 VITALS — BP 160/90 | HR 84 | Temp 97.9°F | Ht 63.0 in | Wt 201.0 lb

## 2019-09-15 DIAGNOSIS — E1165 Type 2 diabetes mellitus with hyperglycemia: Secondary | ICD-10-CM | POA: Diagnosis not present

## 2019-09-15 DIAGNOSIS — I10 Essential (primary) hypertension: Secondary | ICD-10-CM | POA: Diagnosis not present

## 2019-09-15 MED ORDER — JANUMET XR 100-1000 MG PO TB24: 1.0000 | ORAL_TABLET | Freq: Every day | ORAL | 2 refills | Status: DC

## 2019-09-15 MED ORDER — GLIPIZIDE ER 5 MG PO TB24: 5.0000 mg | ORAL_TABLET | Freq: Every day | ORAL | 1 refills | Status: DC

## 2019-09-15 NOTE — Progress Notes (Signed)
   Subjective:    Patient ID: Nancy Wagner, female    DOB: [birth year], 68 y.o.   MRN: [id number]  HPI 68 year old Female in today for follow-up of diabetes mellitus.  In late September hemoglobin A1c was 9.1%.  I started her on Janumet 50/500.  Glucophage and Prandin that were discontinued.  Accu-Cheks have improved but could be better.  She feels well and has no new complaints.  Tolerating new medication.  History of difficult to control hypertension.    Review of Systems     Objective:   Physical Exam Blood pressure today 160/90 BMI 35.61.  She is to go to a gym but the gyms have been closed.  Pulse is 84 and regular.       Assessment & Plan:  Poorly controlled diabetes mellitus tolerating Janumet 50/500 well.  Increase to 100/1000 and follow-up end of October.  She has appointment for hemoglobin A1c end of November.

## 2019-09-27 ENCOUNTER — Other Ambulatory Visit: Payer: Self-pay | Admitting: Internal Medicine

## 2019-09-29 ENCOUNTER — Other Ambulatory Visit: Payer: Self-pay

## 2019-09-29 ENCOUNTER — Encounter: Payer: Self-pay | Admitting: Internal Medicine

## 2019-09-29 ENCOUNTER — Ambulatory Visit: Payer: Medicare Other | Admitting: Internal Medicine

## 2019-09-29 VITALS — BP 120/70 | HR 68 | Temp 98.0°F | Ht 63.0 in | Wt 202.0 lb

## 2019-09-29 DIAGNOSIS — E1165 Type 2 diabetes mellitus with hyperglycemia: Secondary | ICD-10-CM

## 2019-09-29 NOTE — Patient Instructions (Signed)
It was a pleasure to see you today.  Continue Janumet 100/1000 daily and glipizide XL 5 mg daily.  Follow-up with hemoglobin A1c without office visit in 4 weeks.

## 2019-09-29 NOTE — Progress Notes (Signed)
   Subjective:    Patient ID: Nancy Wagner, female    DOB: February 03, 1951, 68 y.o.   MRN: [id number]  HPI 68 year old  Female with hypertension and diabetes mellitus.  When she was seen in early October, her hemoglobin had risen to 9.1%.  Previously had been 8.8% in June and 7.5% in October 2019.  Gave her samples of Janumet XR at visit October 2 and she says her Accu-Cheks have improved.   She has been trying to walk some.  At last visit October 16, she was given new prescription for Janumet 100/1000.  Accu-Cheks have improved even further.  Have suggested she have hemoglobin A1c without office visit in 4 weeks on Janumet 100/1000 and glipizide XL 5 mg daily.  She is agreeable to this.  She declines flu vaccine.    Review of Systems see above     Objective:   Physical Exam  Blood pressure is excellent today at 120/70 pulse 68 weight 202 pounds height 5 feet 3 inches BMI 35.78 discussed with her for 10 minutes about blood pressure readings importance of diet exercise and weight loss.  She declines flu vaccine.      Assessment & Plan:  Poorly controlled diabetes-Accu-Cheks reportedly improved with Janumet 100/1000 and glipizide XL 5 mg daily.  To have hemoglobin A1c drawn in 4 weeks without office visit.

## 2019-09-30 NOTE — Patient Instructions (Signed)
Increase Janumet to 100/1000 and follow-up end of October.

## 2019-10-02 ENCOUNTER — Telehealth: Payer: Self-pay | Admitting: Internal Medicine

## 2019-10-02 NOTE — Telephone Encounter (Signed)
Received Fax RX request from  [facility]  Medication - amLODipine (NORVASC) 5 MG tablet    Last Refill -   Last OV - 09/29/19  Last CPE - 05/26/19

## 2019-10-17 ENCOUNTER — Other Ambulatory Visit: Payer: Self-pay | Admitting: Internal Medicine

## 2019-10-17 ENCOUNTER — Other Ambulatory Visit: Payer: Self-pay

## 2019-10-17 MED ORDER — AMLODIPINE BESYLATE 5 MG PO TABS: 5.0000 mg | ORAL_TABLET | Freq: Every day | ORAL | 1 refills | Status: DC

## 2019-10-23 ENCOUNTER — Other Ambulatory Visit: Payer: Self-pay

## 2019-10-23 ENCOUNTER — Telehealth: Payer: Self-pay | Admitting: Internal Medicine

## 2019-10-23 ENCOUNTER — Encounter: Payer: Self-pay | Admitting: Internal Medicine

## 2019-10-23 ENCOUNTER — Ambulatory Visit: Payer: Medicare Other | Admitting: Internal Medicine

## 2019-10-23 VITALS — BP 120/80 | HR 76 | Temp 98.0°F | Ht 63.0 in | Wt 202.0 lb

## 2019-10-23 DIAGNOSIS — M25475 Effusion, left foot: Secondary | ICD-10-CM | POA: Diagnosis not present

## 2019-10-23 DIAGNOSIS — E790 Hyperuricemia without signs of inflammatory arthritis and tophaceous disease: Secondary | ICD-10-CM | POA: Diagnosis not present

## 2019-10-23 DIAGNOSIS — M10372 Gout due to renal impairment, left ankle and foot: Secondary | ICD-10-CM

## 2019-10-23 DIAGNOSIS — M109 Gout, unspecified: Secondary | ICD-10-CM

## 2019-10-23 MED ORDER — INDOMETHACIN 50 MG PO CAPS: 50.0000 mg | ORAL_CAPSULE | Freq: Three times a day (TID) | ORAL | 0 refills | Status: DC

## 2019-10-23 MED ORDER — HYDROCODONE-ACETAMINOPHEN 5-325 MG PO TABS: ORAL_TABLET | ORAL | 0 refills | Status: DC

## 2019-10-23 MED ORDER — ALLOPURINOL 100 MG PO TABS: 100.0000 mg | ORAL_TABLET | Freq: Every day | ORAL | 1 refills | Status: DC

## 2019-10-23 NOTE — Telephone Encounter (Signed)
Scheduled

## 2019-10-23 NOTE — Telephone Encounter (Signed)
Nancy Wagner [phone]  [place] called to say she has gout in her toe, it has been very painful over the weekend.

## 2019-10-23 NOTE — Telephone Encounter (Signed)
Office visit

## 2019-10-23 NOTE — Telephone Encounter (Signed)
Pain called requesting pain medication. Norco 5/325 (#15) called in. Meds being prescribed clarified

## 2019-10-23 NOTE — Patient Instructions (Addendum)
Take Indocin 50 mg 3 times a day x 10 days. Start Allopurinol 100 mg daily.  Take Norco 5/325 sparingly every 8 hours as needed for pain.  Serum uric acid level drawn and pending.

## 2019-10-23 NOTE — Progress Notes (Signed)
   Subjective:    Patient ID: Nancy Wagner, female    DOB: 1951-08-29, 68 y.o.   MRN: [id number]  HPI 68 year old Female with hypertension and diabetes mellitus in today with acute foot pain.  States she ate some roasted peanuts on 2 separate occasions recently.  She thinks this caused a flare of gout in her left forst MTP joint.  History of elevated uric acid at 8.5 with similar episode in October 2018 and with no further recurrences until now.  At the time, it was unclear if she had cellulitis or acute gout so she was treated with Indocin and an antibiotic and improved.  She has a history of obesity, hypertension and diabetes mellitus  Review of Systems Having salpingoophorectomy robotic assisted surgery by Dr. Ihor Gully. History of elevated testosterone level.        Objective:   Physical Exam Blood pressure excellent at 120/80, pulse 76 pulse oximetry 98% weight 202 pounds BMI 35.78  Left first MTP joint swollen.  It is red and very tender along medial aspect.       Assessment & Plan:  Acute gout left first MTP joint  Plan: Serum uric acid drawn.  Begin Indocin 50 mg 3 times a day for 7 to 10 days.  Start allopurinol 100 mg daily.  Norco 5/325 1 p.o. every 8 hours sparingly as needed pain.

## 2019-10-24 LAB — URIC ACID: Uric Acid, Serum: 8.6 mg/dL — ABNORMAL HIGH (ref 2.5–7.0)

## 2019-10-30 ENCOUNTER — Other Ambulatory Visit: Payer: Medicare Other | Admitting: Internal Medicine

## 2019-11-01 ENCOUNTER — Telehealth: Payer: Self-pay

## 2019-11-01 NOTE — Telephone Encounter (Signed)
Set up visit Thursay

## 2019-11-01 NOTE — Telephone Encounter (Signed)
Patient called she is taking allopurinol and Indocin and her toe is still swollen and painful. She would like to know what to do next?

## 2019-11-02 NOTE — Telephone Encounter (Signed)
Attempted to reach patient neither phone number will let me leave voice mail.

## 2019-11-02 NOTE — Telephone Encounter (Signed)
Unable to leave vm.

## 2019-11-21 ENCOUNTER — Other Ambulatory Visit: Payer: Self-pay | Admitting: Obstetrics and Gynecology

## 2019-12-06 NOTE — H&P (Signed)
Nancy Wagner is a 69 y.o. female, P : 2-0-1-2 presenting for removal of both ovaries due to a symptomatic elevation of her testosterone. For approximately 2 years the patient has experienced episodes of post menopausal spotting with a benign Gyn workup that included an endometrial biopsy revealing only a benign polyp. Since that time she developed hirsutism and what her dermatologist described as "teenage" acne. She was then seen by an endocrinologist and her testosterone was checked and returned (total) 193 ng/dL with 21.4 pg/mL  free testosterone.  A repeat test 10 months later showed a total testosterone of 235 ng/dL and free testosterone of 32.0 pg/mL.  A CA-125  was within normal range. The patient admits to occasional "period-like" pelvic cramping when she has her vaginal spotting but denes changes in bowel or bladder function or vaginitis symptoms.  Given the persistent symptoms and elevation of testosterone the patient has decided to proceed with removal of both ovaries.  Past Medical History  OB History: G: 3;  P: 2-0-1-2;  C-section: 30 and 1978  GYN History: menarche: 69 YO;   Denies history of abnormal PAP smear.   Last PAP smear:2018 with negative HPV  Medical History: Gout, Rheumatoid Arthritis, Hypertension, Diabetes Mellitus, GERD, Hirsutism, Eczema, Lichen Simplex Chronicus, Acne, Endometrial  Polyp  and Fibroids  Surgical History: 1979 Tubal Sterilization; 1980 Cholecystectomy; 1995 & 2018 Dilatation and Curettage; 2014 Oral Surgery and 2017 Left Great Toe Surgery Denies problems with anesthesia except it takes more than average to anesthetize per patient.  No history of blood transfusions.   Family History: Asthma, Multiple Myeloma, Cardiovascular Disease, Stroke and Glaucoma  Social History:  Married and Retired:  Denies tobacco use and admits to  rare alcohol use.   Medications: Glipizide ER 5 mg daily Esomeprazple 40 mg daily Albuterol HFA 90 mcg/actuation 2 puffs as  directed Allopurinol 100 mg daily Amlodipine 5 mg daily Atenolol 100 mg daily Clonidine 0.1 mg daily Cyclobenzaprine 10 mg 1/2 tablet qhs Doxycycline 100 mg daily Janumet XR [phone] daily Olmesartan 40/HCTZ 25 mg daily Simvastatin 20 mg daily   Allergies  Allergen Reactions  . Adhesive [Tape] Rash    Band-Aid  . Codeine Hives, Itching and Rash   Codeine causes a generalized body rash.    Denies sensitivity to shellfish or latex.  Peanuts cause gout flare, soy causes abdominal bloating and adhesives cause a  rash.   ROS: Admits to glasses, Invisalign Retainer, frequent sinus problems, bilateral wrist swelling, eczema of right axilla and left breast (upper inner quadrant) but  denies headache, vision changes,  dysphagia, tinnitus, dizziness, hoarseness, cough,  chest pain, shortness of breath, nausea, vomiting, diarrhea,constipation,  urinary frequency, urgency  dysuria, hematuria, vaginitis symptoms, pelvic pain, easy bruising,  myalgias, arthralgias,  unexplained weight loss and except as is mentioned in the history of present illness, patient's review of systems is otherwise negative.     Physical Exam  Bp: 100/60;   P: 68 regular;   R: 16;   O2Sat. 100% (room air); Weight: 202 lbs.  Height: 5'6"  BMI 34.7   Neck: supple without masses or thyromegaly Lungs: clear to auscultation Heart: regular rate and rhythm Abdomen: soft, non-tender and no organomegaly Pelvic:EGBUS- wnl; vagina-normal rugae; uterus-normal size, cervix without lesions or motion tenderness; adnexae-no tenderness or masses Extremities:  no clubbing, cyanosis or edema   Assesment: Elevated Testosterone                       Hirsutism  Disposition:  A discussion was held with patient regarding the indication for her procedure(s) along with the risks, which include but are not limited to:  reaction to anesthesia, damage to adjacent organs, infection and excessive bleeding. The patient verbalized  understanding of these risks and has consented to proceed with Robot Assisted Bilateral Salpingo-oophorectomy at [facility] on January 14,, 2021 at 7:30 a.m.   CSN# [id number]   Nancy Wagner J. Florene Glen, PA-C  for Dr. Dede Query. [place]

## 2019-12-08 ENCOUNTER — Other Ambulatory Visit: Payer: Self-pay

## 2019-12-08 ENCOUNTER — Encounter (HOSPITAL_BASED_OUTPATIENT_CLINIC_OR_DEPARTMENT_OTHER): Payer: Self-pay | Admitting: Obstetrics and Gynecology

## 2019-12-08 NOTE — Progress Notes (Signed)
Spoke w/ via phone for pre-op interview--- PT  Lab needs dos----    none           Lab results------ getting CBC,BMP,EKG done 12-11-2019 @ 0900 COVID test ------  12-11-2019 @ 1000  Arrive at -------  1000 NPO after ------  MN w/ exception clear liquids until 0900 , at which time will complete G2 gatorade drink , then nothing by mouth.    Medications to take morning of surgery ----- Clonidine, Allopurinol, Nexium w/ sips of water Diabetic medication ----- do not take any diabetic medication morning of surgery  Patient Special Instructions ----- pt to pick-up drink with handout instructions at lab appt 12-11-2019 for drink. Asked to bring rescue inhaler dos. Pre-Op special Istructions ----- pt is extended stay not overnight  Patient verbalized understanding of instructions that were given at this phone interview. Patient denies shortness of breath, chest pain, fever, cough a this phone interview.   Anesthesia:  Hx HTN, DM2, gout, hx osa  PCP: Dr Tedra Senegal (lov 10-23-2019 epic) Cardiologist : no Chest x-ray : 01-19-2011 epic EKG : 04-07-2017 epic Echo : no Stress test:  Pt stated stress test many yrs ago, was told ok (not available in epic) Cardiac Cath :  no Sleep Study/ CPAP : YES/ No, s/p UPPP 1985 Fasting Blood Sugar :  110-126    / Checks Blood Sugar -- times a day:   Once daily in am Blood Thinner/ Instructions /Last Dose: NO ASA / Instructions/ Last Dose :  NO

## 2019-12-11 ENCOUNTER — Encounter (HOSPITAL_COMMUNITY)
Admission: RE | Admit: 2019-12-11 | Discharge: 2019-12-11 | Disposition: A | Discharge status: Home / Self Care | Payer: Medicare PPO | Source: Ambulatory Visit | Attending: Obstetrics and Gynecology | Admitting: Obstetrics and Gynecology

## 2019-12-11 ENCOUNTER — Other Ambulatory Visit (HOSPITAL_COMMUNITY)
Admission: RE | Admit: 2019-12-11 | Discharge: 2019-12-11 | Disposition: A | Discharge status: Home / Self Care | Payer: Medicare PPO | Source: Ambulatory Visit | Attending: Obstetrics and Gynecology | Admitting: Obstetrics and Gynecology

## 2019-12-11 ENCOUNTER — Other Ambulatory Visit: Payer: Self-pay

## 2019-12-11 ENCOUNTER — Other Ambulatory Visit: Payer: Self-pay | Admitting: Internal Medicine

## 2019-12-11 DIAGNOSIS — Z01818 Encounter for other preprocedural examination: Secondary | ICD-10-CM | POA: Insufficient documentation

## 2019-12-11 DIAGNOSIS — Z20822 Contact with and (suspected) exposure to covid-19: Secondary | ICD-10-CM | POA: Diagnosis not present

## 2019-12-11 LAB — CBC
HCT: 40.5 % (ref 36.0–46.0)
Hemoglobin: 12.8 g/dL (ref 12.0–15.0)
MCH: 29.7 pg (ref 26.0–34.0)
MCHC: 31.6 g/dL (ref 30.0–36.0)
MCV: 94 fL (ref 80.0–100.0)
Platelets: 226 10*3/uL (ref 150–400)
RBC: 4.31 MIL/uL (ref 3.87–5.11)
RDW: 14 % (ref 11.5–15.5)
WBC: 8 10*3/uL (ref 4.0–10.5)
nRBC: 0 % (ref 0.0–0.2)

## 2019-12-11 LAB — BASIC METABOLIC PANEL
Anion gap: 11 (ref 5–15)
BUN: 14 mg/dL (ref 8–23)
CO2: 28 mmol/L (ref 22–32)
Calcium: 9.5 mg/dL (ref 8.9–10.3)
Chloride: 101 mmol/L (ref 98–111)
Creatinine, Ser: 1.07 mg/dL — ABNORMAL HIGH (ref 0.44–1.00)
GFR calc Af Amer: >60 mL/min (ref >60)
GFR calc non Af Amer: 53 mL/min — ABNORMAL LOW (ref >60)
Glucose, Bld: 153 mg/dL — ABNORMAL HIGH (ref 70–99)
Potassium: 3.8 mmol/L (ref 3.5–5.1)
Sodium: 140 mmol/L (ref 135–145)

## 2019-12-11 MED ORDER — ENSURE PRE-SURGERY PO LIQD
296.0000 mL | Freq: Once | ORAL | Status: DC
  Filled 2019-12-11: qty 296

## 2019-12-12 LAB — NOVEL CORONAVIRUS, NAA (HOSP ORDER, SEND-OUT TO REF LAB; TAT 18-24 HRS): SARS-CoV-2, NAA: NOT DETECTED

## 2019-12-13 ENCOUNTER — Other Ambulatory Visit: Payer: Self-pay | Admitting: Internal Medicine

## 2019-12-14 ENCOUNTER — Other Ambulatory Visit: Payer: Self-pay

## 2019-12-14 ENCOUNTER — Ambulatory Visit (HOSPITAL_COMMUNITY): Payer: Medicare PPO | Admitting: Anesthesiology

## 2019-12-14 ENCOUNTER — Encounter (HOSPITAL_BASED_OUTPATIENT_CLINIC_OR_DEPARTMENT_OTHER)
Admission: RE | Disposition: A | Discharge status: Home / Self Care | Payer: Self-pay | Source: Ambulatory Visit | Attending: Obstetrics and Gynecology

## 2019-12-14 ENCOUNTER — Observation Stay (HOSPITAL_BASED_OUTPATIENT_CLINIC_OR_DEPARTMENT_OTHER)
Admission: RE | Admit: 2019-12-14 | Discharge: 2019-12-15 | Disposition: A | Discharge status: Home / Self Care | Payer: Medicare PPO | Source: Ambulatory Visit | Attending: Obstetrics and Gynecology | Admitting: Obstetrics and Gynecology

## 2019-12-14 ENCOUNTER — Encounter (HOSPITAL_BASED_OUTPATIENT_CLINIC_OR_DEPARTMENT_OTHER): Payer: Self-pay | Admitting: Obstetrics and Gynecology

## 2019-12-14 DIAGNOSIS — E281 Androgen excess: Principal | ICD-10-CM | POA: Insufficient documentation

## 2019-12-14 DIAGNOSIS — E782 Mixed hyperlipidemia: Secondary | ICD-10-CM | POA: Insufficient documentation

## 2019-12-14 DIAGNOSIS — Z79899 Other long term (current) drug therapy: Secondary | ICD-10-CM | POA: Insufficient documentation

## 2019-12-14 DIAGNOSIS — Z8249 Family history of ischemic heart disease and other diseases of the circulatory system: Secondary | ICD-10-CM | POA: Diagnosis not present

## 2019-12-14 DIAGNOSIS — Z7984 Long term (current) use of oral hypoglycemic drugs: Secondary | ICD-10-CM | POA: Insufficient documentation

## 2019-12-14 DIAGNOSIS — R7989 Other specified abnormal findings of blood chemistry: Secondary | ICD-10-CM | POA: Diagnosis not present

## 2019-12-14 DIAGNOSIS — M109 Gout, unspecified: Secondary | ICD-10-CM | POA: Insufficient documentation

## 2019-12-14 DIAGNOSIS — L680 Hirsutism: Secondary | ICD-10-CM | POA: Insufficient documentation

## 2019-12-14 DIAGNOSIS — L708 Other acne: Secondary | ICD-10-CM | POA: Diagnosis not present

## 2019-12-14 DIAGNOSIS — N950 Postmenopausal bleeding: Secondary | ICD-10-CM | POA: Insufficient documentation

## 2019-12-14 DIAGNOSIS — Z885 Allergy status to narcotic agent status: Secondary | ICD-10-CM | POA: Insufficient documentation

## 2019-12-14 DIAGNOSIS — D3910 Neoplasm of uncertain behavior of unspecified ovary: Secondary | ICD-10-CM | POA: Diagnosis not present

## 2019-12-14 DIAGNOSIS — D259 Leiomyoma of uterus, unspecified: Secondary | ICD-10-CM | POA: Diagnosis not present

## 2019-12-14 DIAGNOSIS — I10 Essential (primary) hypertension: Secondary | ICD-10-CM | POA: Insufficient documentation

## 2019-12-14 DIAGNOSIS — Z6836 Body mass index (BMI) 36.0-36.9, adult: Secondary | ICD-10-CM | POA: Insufficient documentation

## 2019-12-14 DIAGNOSIS — N840 Polyp of corpus uteri: Secondary | ICD-10-CM | POA: Insufficient documentation

## 2019-12-14 DIAGNOSIS — M1712 Unilateral primary osteoarthritis, left knee: Secondary | ICD-10-CM | POA: Insufficient documentation

## 2019-12-14 DIAGNOSIS — E119 Type 2 diabetes mellitus without complications: Secondary | ICD-10-CM | POA: Insufficient documentation

## 2019-12-14 DIAGNOSIS — M069 Rheumatoid arthritis, unspecified: Secondary | ICD-10-CM | POA: Diagnosis not present

## 2019-12-14 DIAGNOSIS — D4959 Neoplasm of unspecified behavior of other genitourinary organ: Secondary | ICD-10-CM | POA: Diagnosis not present

## 2019-12-14 DIAGNOSIS — K219 Gastro-esophageal reflux disease without esophagitis: Secondary | ICD-10-CM | POA: Insufficient documentation

## 2019-12-14 DIAGNOSIS — Z888 Allergy status to other drugs, medicaments and biological substances status: Secondary | ICD-10-CM | POA: Insufficient documentation

## 2019-12-14 DIAGNOSIS — E669 Obesity, unspecified: Secondary | ICD-10-CM | POA: Diagnosis not present

## 2019-12-14 DIAGNOSIS — E288 Other ovarian dysfunction: Secondary | ICD-10-CM | POA: Diagnosis present

## 2019-12-14 HISTORY — PX: ROBOTIC ASSISTED SALPINGO OOPHERECTOMY: SHX6082

## 2019-12-14 HISTORY — DX: Presence of spectacles and contact lenses: Z97.3

## 2019-12-14 HISTORY — DX: Personal history of other diseases of the nervous system and sense organs: Z86.69

## 2019-12-14 HISTORY — DX: Gout, unspecified: M10.9

## 2019-12-14 HISTORY — DX: Rheumatoid arthritis, unspecified: M06.9

## 2019-12-14 HISTORY — DX: Hirsutism: L68.0

## 2019-12-14 HISTORY — DX: Other specified abnormal findings of blood chemistry: R79.89

## 2019-12-14 LAB — GLUCOSE, CAPILLARY
Glucose-Capillary: 131 mg/dL — ABNORMAL HIGH (ref 70–99)
Glucose-Capillary: 114 mg/dL — ABNORMAL HIGH (ref 70–99)
Glucose-Capillary: 146 mg/dL — ABNORMAL HIGH (ref 70–99)
Glucose-Capillary: 163 mg/dL — ABNORMAL HIGH (ref 70–99)

## 2019-12-14 SURGERY — SALPINGO-OOPHORECTOMY, ROBOT-ASSISTED
Anesthesia: General | Laterality: Bilateral

## 2019-12-14 MED ORDER — SODIUM CHLORIDE (PF) 0.9 % IJ SOLN
INTRAMUSCULAR | Status: AC
  Filled 2019-12-14: qty 50

## 2019-12-14 MED ORDER — KETOROLAC TROMETHAMINE 15 MG/ML IJ SOLN
INTRAMUSCULAR | Status: DC | PRN
  Administered 2019-12-14: 15 mg via INTRAVENOUS

## 2019-12-14 MED ORDER — SUGAMMADEX SODIUM 500 MG/5ML IV SOLN
INTRAVENOUS | Status: AC
  Filled 2019-12-14: qty 5

## 2019-12-14 MED ORDER — LACTATED RINGERS IV SOLN
INTRAVENOUS | Status: DC | PRN
  Administered 2019-12-14: 1000 mL

## 2019-12-14 MED ORDER — DEXAMETHASONE SODIUM PHOSPHATE 10 MG/ML IJ SOLN
INTRAMUSCULAR | Status: DC | PRN
  Administered 2019-12-14: 4 mg via INTRAVENOUS

## 2019-12-14 MED ORDER — LIDOCAINE 2% (20 MG/ML) 5 ML SYRINGE
INTRAMUSCULAR | Status: AC
  Filled 2019-12-14: qty 5

## 2019-12-14 MED ORDER — ROCURONIUM BROMIDE 100 MG/10ML IV SOLN
INTRAVENOUS | Status: DC | PRN
  Administered 2019-12-14: 50 mg via INTRAVENOUS
  Administered 2019-12-14: 10 mg via INTRAVENOUS

## 2019-12-14 MED ORDER — DEXAMETHASONE SODIUM PHOSPHATE 4 MG/ML IJ SOLN
4.0000 mg | INTRAMUSCULAR | Status: AC
  Filled 2019-12-14: qty 1

## 2019-12-14 MED ORDER — ONDANSETRON HCL 4 MG/2ML IJ SOLN
INTRAMUSCULAR | Status: DC | PRN
  Administered 2019-12-14: 4 mg via INTRAVENOUS

## 2019-12-14 MED ORDER — SCOPOLAMINE 1 MG/3DAYS TD PT72
MEDICATED_PATCH | TRANSDERMAL | Status: AC
  Filled 2019-12-14: qty 1

## 2019-12-14 MED ORDER — FENTANYL CITRATE (PF) 250 MCG/5ML IJ SOLN
INTRAMUSCULAR | Status: DC | PRN
  Administered 2019-12-14: 50 ug via INTRAVENOUS
  Administered 2019-12-14: 100 ug via INTRAVENOUS

## 2019-12-14 MED ORDER — ACETAMINOPHEN 500 MG PO TABS
1000.0000 mg | ORAL_TABLET | ORAL | Status: AC
  Administered 2019-12-14: 10:00:00 1000 mg via ORAL
  Filled 2019-12-14: qty 2

## 2019-12-14 MED ORDER — MIDAZOLAM HCL 2 MG/2ML IJ SOLN
INTRAMUSCULAR | Status: AC
  Filled 2019-12-14: qty 2

## 2019-12-14 MED ORDER — ALBUTEROL SULFATE HFA 108 (90 BASE) MCG/ACT IN AERS
INHALATION_SPRAY | RESPIRATORY_TRACT | Status: AC
  Filled 2019-12-14: qty 6.7

## 2019-12-14 MED ORDER — SCOPOLAMINE 1 MG/3DAYS TD PT72
1.0000 | MEDICATED_PATCH | TRANSDERMAL | Status: DC
  Administered 2019-12-14: 10:00:00 1.5 mg via TRANSDERMAL
  Filled 2019-12-14: qty 1

## 2019-12-14 MED ORDER — KETOROLAC TROMETHAMINE 30 MG/ML IJ SOLN
INTRAMUSCULAR | Status: AC
  Filled 2019-12-14: qty 1

## 2019-12-14 MED ORDER — DEXAMETHASONE SODIUM PHOSPHATE 10 MG/ML IJ SOLN
INTRAMUSCULAR | Status: AC
  Filled 2019-12-14: qty 1

## 2019-12-14 MED ORDER — ACETAMINOPHEN 500 MG PO TABS
ORAL_TABLET | ORAL | Status: AC
  Filled 2019-12-14: qty 2

## 2019-12-14 MED ORDER — ONDANSETRON HCL 4 MG/2ML IJ SOLN
INTRAMUSCULAR | Status: AC
  Filled 2019-12-14: qty 2

## 2019-12-14 MED ORDER — CELECOXIB 200 MG PO CAPS
ORAL_CAPSULE | ORAL | Status: AC
  Filled 2019-12-14: qty 1

## 2019-12-14 MED ORDER — LIDOCAINE HCL (CARDIAC) PF 100 MG/5ML IV SOSY
PREFILLED_SYRINGE | INTRAVENOUS | Status: DC | PRN
  Administered 2019-12-14: 100 mg via INTRAVENOUS

## 2019-12-14 MED ORDER — CELECOXIB 400 MG PO CAPS
400.0000 mg | ORAL_CAPSULE | ORAL | Status: AC
  Administered 2019-12-14: 400 mg via ORAL
  Filled 2019-12-14: qty 1

## 2019-12-14 MED ORDER — SODIUM CHLORIDE (PF) 0.9 % IJ SOLN
INTRAMUSCULAR | Status: AC
  Filled 2019-12-14: qty 10

## 2019-12-14 MED ORDER — ROPIVACAINE HCL 5 MG/ML IJ SOLN
INTRAMUSCULAR | Status: DC | PRN
  Administered 2019-12-14: 20 mL

## 2019-12-14 MED ORDER — GABAPENTIN 300 MG PO CAPS
ORAL_CAPSULE | ORAL | Status: AC
  Filled 2019-12-14: qty 1

## 2019-12-14 MED ORDER — ROCURONIUM BROMIDE 10 MG/ML (PF) SYRINGE
PREFILLED_SYRINGE | INTRAVENOUS | Status: AC
  Filled 2019-12-14: qty 10

## 2019-12-14 MED ORDER — SUGAMMADEX SODIUM 500 MG/5ML IV SOLN
INTRAVENOUS | Status: DC | PRN
  Administered 2019-12-14: 200 mg via INTRAVENOUS

## 2019-12-14 MED ORDER — LACTATED RINGERS IV SOLN
INTRAVENOUS | Status: DC
  Filled 2019-12-14: qty 1000

## 2019-12-14 MED ORDER — FENTANYL CITRATE (PF) 250 MCG/5ML IJ SOLN
INTRAMUSCULAR | Status: AC
  Filled 2019-12-14: qty 5

## 2019-12-14 MED ORDER — ONDANSETRON HCL 4 MG/2ML IJ SOLN: 4.0000 mg | Freq: Once | INTRAMUSCULAR | Status: DC | PRN

## 2019-12-14 MED ORDER — PROPOFOL 10 MG/ML IV BOLUS
INTRAVENOUS | Status: DC | PRN
  Administered 2019-12-14: 80 mg via INTRAVENOUS

## 2019-12-14 MED ORDER — ACETAMINOPHEN 10 MG/ML IV SOLN
INTRAVENOUS | Status: AC
  Administered 2019-12-14: 20:00:00 1000 mg
  Filled 2019-12-14: qty 100

## 2019-12-14 MED ORDER — SODIUM CHLORIDE (PF) 0.9 % IJ SOLN
INTRAMUSCULAR | Status: DC | PRN
  Administered 2019-12-14: 20 mL

## 2019-12-14 MED ORDER — ACETAMINOPHEN 10 MG/ML IV SOLN: 1000.0000 mg | Freq: Once | INTRAVENOUS | Status: DC | PRN

## 2019-12-14 MED ORDER — PROPOFOL 10 MG/ML IV BOLUS
INTRAVENOUS | Status: AC
  Filled 2019-12-14: qty 20

## 2019-12-14 MED ORDER — IBUPROFEN 400 MG PO TABS: ORAL_TABLET | ORAL | 1 refills | Status: DC

## 2019-12-14 MED ORDER — ROPIVACAINE HCL 5 MG/ML IJ SOLN
INTRAMUSCULAR | Status: AC
  Filled 2019-12-14: qty 30

## 2019-12-14 MED ORDER — PHENYLEPHRINE HCL-NACL 10-0.9 MG/250ML-% IV SOLN
INTRAVENOUS | Status: DC | PRN
  Administered 2019-12-14 (×2): 25 ug/min via INTRAVENOUS

## 2019-12-14 MED ORDER — FENTANYL CITRATE (PF) 100 MCG/2ML IJ SOLN: 25.0000 ug | INTRAMUSCULAR | Status: DC | PRN

## 2019-12-14 MED ORDER — GABAPENTIN 300 MG PO CAPS
300.0000 mg | ORAL_CAPSULE | ORAL | Status: AC
  Administered 2019-12-14: 300 mg via ORAL
  Filled 2019-12-14: qty 1

## 2019-12-14 MED ORDER — MIDAZOLAM HCL 5 MG/5ML IJ SOLN
INTRAMUSCULAR | Status: DC | PRN
  Administered 2019-12-14: 1.5 mg via INTRAVENOUS
  Administered 2019-12-14: .5 mg via INTRAVENOUS

## 2019-12-14 MED ORDER — ACETAMINOPHEN 500 MG PO TABS
1000.0000 mg | ORAL_TABLET | Freq: Four times a day (QID) | ORAL | Status: DC | PRN
  Administered 2019-12-15: 1000 mg via ORAL
  Filled 2019-12-14: qty 2

## 2019-12-14 SURGICAL SUPPLY — 68 items
ADH SKN CLS APL DERMABOND .7 (GAUZE/BANDAGES/DRESSINGS) ×1
BARRIER ADHS 3X4 INTERCEED (GAUZE/BANDAGES/DRESSINGS) ×3 IMPLANT
BLADE LAP MORCELLATOR 15MMX9.5 (ELECTROSURGICAL)
BLADE LAP MORCELLATOR 15X9.5 (ELECTROSURGICAL) IMPLANT
BLADE MORCELLATOR EXT  12.5X15 (ELECTROSURGICAL)
BLADE MORCELLATOR EXT 12.5X15 (ELECTROSURGICAL) IMPLANT
BRR ADH 4X3 ABS CNTRL BYND (GAUZE/BANDAGES/DRESSINGS) ×1
CANISTER SUCT 3000ML PPV (MISCELLANEOUS) ×3 IMPLANT
CLOSURE WOUND 1/2 X4 (GAUZE/BANDAGES/DRESSINGS) ×1
CLOSURE WOUND 1/4X4 (GAUZE/BANDAGES/DRESSINGS) ×1
COVER BACK TABLE 60X90IN (DRAPES) ×3 IMPLANT
COVER TIP SHEARS 8 DVNC (MISCELLANEOUS) ×1 IMPLANT
COVER TIP SHEARS 8MM DA VINCI (MISCELLANEOUS) ×2
COVER WAND RF STERILE (DRAPES) ×3 IMPLANT
DECANTER SPIKE VIAL GLASS SM (MISCELLANEOUS) ×6 IMPLANT
DEFOGGER SCOPE WARMER CLEARIFY (MISCELLANEOUS) ×3 IMPLANT
DERMABOND ADVANCED (GAUZE/BANDAGES/DRESSINGS) ×2
DERMABOND ADVANCED .7 DNX12 (GAUZE/BANDAGES/DRESSINGS) ×1 IMPLANT
DILATOR CANAL MILEX (MISCELLANEOUS) IMPLANT
DRAPE ARM DVNC X/XI (DISPOSABLE) ×4 IMPLANT
DRAPE COLUMN DVNC XI (DISPOSABLE) ×1 IMPLANT
DRAPE DA VINCI XI ARM (DISPOSABLE) ×8
DRAPE DA VINCI XI COLUMN (DISPOSABLE) ×2
DURAPREP 26ML APPLICATOR (WOUND CARE) ×3 IMPLANT
ELECT REM PT RETURN 9FT ADLT (ELECTROSURGICAL) ×3
ELECTRODE REM PT RTRN 9FT ADLT (ELECTROSURGICAL) ×1 IMPLANT
GAUZE 4X4 16PLY RFD (DISPOSABLE) ×3 IMPLANT
GLOVE BIO SURGEON STRL SZ7 (GLOVE) ×3 IMPLANT
GLOVE BIOGEL PI IND STRL 7.0 (GLOVE) ×5 IMPLANT
GLOVE BIOGEL PI INDICATOR 7.0 (GLOVE) ×10
GLOVE ECLIPSE 6.5 STRL STRAW (GLOVE) ×9 IMPLANT
IRRIG SUCT STRYKERFLOW 2 WTIP (MISCELLANEOUS) ×3
IRRIGATION SUCT STRKRFLW 2 WTP (MISCELLANEOUS) ×1 IMPLANT
LEGGING LITHOTOMY PAIR STRL (DRAPES) ×3 IMPLANT
OBTURATOR OPTICAL STANDARD 8MM (TROCAR) ×2
OBTURATOR OPTICAL STND 8 DVNC (TROCAR) ×1
OBTURATOR OPTICALSTD 8 DVNC (TROCAR) ×1 IMPLANT
OCCLUDER COLPOPNEUMO (BALLOONS) ×3 IMPLANT
PACK ROBOT WH (CUSTOM PROCEDURE TRAY) ×3 IMPLANT
PACK ROBOTIC GOWN (GOWN DISPOSABLE) ×3 IMPLANT
PACK TRENDGUARD 450 HYBRID PRO (MISCELLANEOUS) ×1 IMPLANT
PAD PREP 24X48 CUFFED NSTRL (MISCELLANEOUS) ×3 IMPLANT
POUCH LAPAROSCOPIC INSTRUMENT (MISCELLANEOUS) ×3 IMPLANT
PROTECTOR NERVE ULNAR (MISCELLANEOUS) ×9 IMPLANT
SEAL CANN UNIV 5-8 DVNC XI (MISCELLANEOUS) ×3 IMPLANT
SEAL XI 5MM-8MM UNIVERSAL (MISCELLANEOUS) ×6
SEALER VESSEL DA VINCI XI (MISCELLANEOUS)
SEALER VESSEL EXT DVNC XI (MISCELLANEOUS) IMPLANT
SET TRI-LUMEN FLTR TB AIRSEAL (TUBING) ×3 IMPLANT
STRIP CLOSURE SKIN 1/2X4 (GAUZE/BANDAGES/DRESSINGS) ×2 IMPLANT
STRIP CLOSURE SKIN 1/4X4 (GAUZE/BANDAGES/DRESSINGS) ×2 IMPLANT
SUT MNCRL AB 3-0 PS2 27 (SUTURE) ×6 IMPLANT
SUT VIC AB 0 CT1 27 (SUTURE) ×6
SUT VIC AB 0 CT1 27XBRD ANBCTR (SUTURE) ×2 IMPLANT
SUT VICRYL 0 UR6 27IN ABS (SUTURE) ×3 IMPLANT
SUT VLOC 180 0 9IN  GS21 (SUTURE) ×4
SUT VLOC 180 0 9IN GS21 (SUTURE) ×2 IMPLANT
TIP RUMI ORANGE 6.7MMX12CM (TIP) IMPLANT
TIP UTERINE 5.1X6CM LAV DISP (MISCELLANEOUS) IMPLANT
TIP UTERINE 6.7X10CM GRN DISP (MISCELLANEOUS) IMPLANT
TIP UTERINE 6.7X6CM WHT DISP (MISCELLANEOUS) IMPLANT
TIP UTERINE 6.7X8CM BLUE DISP (MISCELLANEOUS) IMPLANT
TOWEL OR 17X26 10 PK STRL BLUE (TOWEL DISPOSABLE) ×3 IMPLANT
TRAY FOL W/BAG SLVR 16FR STRL (SET/KITS/TRAYS/PACK) ×1 IMPLANT
TRAY FOLEY W/BAG SLVR 16FR LF (SET/KITS/TRAYS/PACK) ×3
TRENDGUARD 450 HYBRID PRO PACK (MISCELLANEOUS) ×3
TROCAR PORT AIRSEAL 8X120 (TROCAR) ×3 IMPLANT
WATER STERILE IRR 1000ML POUR (IV SOLUTION) ×3 IMPLANT

## 2019-12-14 SURGICAL SUPPLY — 71 items
ADH SKN CLS APL DERMABOND .7 (GAUZE/BANDAGES/DRESSINGS) ×1
BARRIER ADHS 3X4 INTERCEED (GAUZE/BANDAGES/DRESSINGS) ×3 IMPLANT
BLADE LAP MORCELLATOR 15MMX9.5 (ELECTROSURGICAL)
BLADE LAP MORCELLATOR 15X9.5 (ELECTROSURGICAL) IMPLANT
BLADE MORCELLATOR EXT  12.5X15 (ELECTROSURGICAL)
BLADE MORCELLATOR EXT 12.5X15 (ELECTROSURGICAL) IMPLANT
BRR ADH 4X3 ABS CNTRL BYND (GAUZE/BANDAGES/DRESSINGS) ×1
CANISTER SUCT 3000ML PPV (MISCELLANEOUS) ×3 IMPLANT
CLOSURE WOUND 1/2 X4 (GAUZE/BANDAGES/DRESSINGS) ×1
CLOSURE WOUND 1/4X4 (GAUZE/BANDAGES/DRESSINGS) ×1
COVER BACK TABLE 60X90IN (DRAPES) ×3 IMPLANT
COVER TIP SHEARS 8 DVNC (MISCELLANEOUS) ×1 IMPLANT
COVER TIP SHEARS 8MM DA VINCI (MISCELLANEOUS) ×2
COVER WAND RF STERILE (DRAPES) ×3 IMPLANT
DECANTER SPIKE VIAL GLASS SM (MISCELLANEOUS) ×6 IMPLANT
DEFOGGER SCOPE WARMER CLEARIFY (MISCELLANEOUS) ×3 IMPLANT
DERMABOND ADVANCED (GAUZE/BANDAGES/DRESSINGS) ×2
DERMABOND ADVANCED .7 DNX12 (GAUZE/BANDAGES/DRESSINGS) ×1 IMPLANT
DILATOR CANAL MILEX (MISCELLANEOUS) IMPLANT
DRAPE ARM DVNC X/XI (DISPOSABLE) ×4 IMPLANT
DRAPE COLUMN DVNC XI (DISPOSABLE) ×1 IMPLANT
DRAPE DA VINCI XI ARM (DISPOSABLE) ×8
DRAPE DA VINCI XI COLUMN (DISPOSABLE) ×2
DRSG TEGADERM 2-3/8X2-3/4 SM (GAUZE/BANDAGES/DRESSINGS) ×2 IMPLANT
DURAPREP 26ML APPLICATOR (WOUND CARE) ×3 IMPLANT
ELECT REM PT RETURN 9FT ADLT (ELECTROSURGICAL) ×3
ELECTRODE REM PT RTRN 9FT ADLT (ELECTROSURGICAL) ×1 IMPLANT
GAUZE 4X4 16PLY RFD (DISPOSABLE) ×3 IMPLANT
GAUZE SPONGE 2X2 8PLY STRL LF (GAUZE/BANDAGES/DRESSINGS) IMPLANT
GLOVE BIO SURGEON STRL SZ7 (GLOVE) ×3 IMPLANT
GLOVE BIOGEL PI IND STRL 7.0 (GLOVE) ×5 IMPLANT
GLOVE BIOGEL PI INDICATOR 7.0 (GLOVE) ×10
GLOVE ECLIPSE 6.5 STRL STRAW (GLOVE) ×9 IMPLANT
IRRIG SUCT STRYKERFLOW 2 WTIP (MISCELLANEOUS) ×3
IRRIGATION SUCT STRKRFLW 2 WTP (MISCELLANEOUS) ×1 IMPLANT
LEGGING LITHOTOMY PAIR STRL (DRAPES) ×3 IMPLANT
OBTURATOR OPTICAL STANDARD 8MM (TROCAR) ×2
OBTURATOR OPTICAL STND 8 DVNC (TROCAR) ×1
OBTURATOR OPTICALSTD 8 DVNC (TROCAR) ×1 IMPLANT
OCCLUDER COLPOPNEUMO (BALLOONS) ×3 IMPLANT
PACK ROBOT WH (CUSTOM PROCEDURE TRAY) ×3 IMPLANT
PACK ROBOTIC GOWN (GOWN DISPOSABLE) ×3 IMPLANT
PACK TRENDGUARD 450 HYBRID PRO (MISCELLANEOUS) ×1 IMPLANT
PAD PREP 24X48 CUFFED NSTRL (MISCELLANEOUS) ×3 IMPLANT
POUCH LAPAROSCOPIC INSTRUMENT (MISCELLANEOUS) ×3 IMPLANT
PROTECTOR NERVE ULNAR (MISCELLANEOUS) ×9 IMPLANT
SEAL CANN UNIV 5-8 DVNC XI (MISCELLANEOUS) ×3 IMPLANT
SEAL XI 5MM-8MM UNIVERSAL (MISCELLANEOUS) ×6
SEALER VESSEL DA VINCI XI (MISCELLANEOUS)
SEALER VESSEL EXT DVNC XI (MISCELLANEOUS) IMPLANT
SET TRI-LUMEN FLTR TB AIRSEAL (TUBING) ×3 IMPLANT
SPONGE GAUZE 2X2 STER 10/PKG (GAUZE/BANDAGES/DRESSINGS) ×2
STRIP CLOSURE SKIN 1/2X4 (GAUZE/BANDAGES/DRESSINGS) ×2 IMPLANT
STRIP CLOSURE SKIN 1/4X4 (GAUZE/BANDAGES/DRESSINGS) ×2 IMPLANT
SUT MNCRL AB 3-0 PS2 27 (SUTURE) ×6 IMPLANT
SUT VIC AB 0 CT1 27 (SUTURE) ×6
SUT VIC AB 0 CT1 27XBRD ANBCTR (SUTURE) ×2 IMPLANT
SUT VICRYL 0 UR6 27IN ABS (SUTURE) ×3 IMPLANT
SUT VLOC 180 0 9IN  GS21 (SUTURE) ×4
SUT VLOC 180 0 9IN GS21 (SUTURE) ×2 IMPLANT
TIP RUMI ORANGE 6.7MMX12CM (TIP) IMPLANT
TIP UTERINE 5.1X6CM LAV DISP (MISCELLANEOUS) IMPLANT
TIP UTERINE 6.7X10CM GRN DISP (MISCELLANEOUS) IMPLANT
TIP UTERINE 6.7X6CM WHT DISP (MISCELLANEOUS) IMPLANT
TIP UTERINE 6.7X8CM BLUE DISP (MISCELLANEOUS) IMPLANT
TOWEL OR 17X26 10 PK STRL BLUE (TOWEL DISPOSABLE) ×3 IMPLANT
TRAY FOL W/BAG SLVR 16FR STRL (SET/KITS/TRAYS/PACK) ×1 IMPLANT
TRAY FOLEY W/BAG SLVR 16FR LF (SET/KITS/TRAYS/PACK) ×3
TRENDGUARD 450 HYBRID PRO PACK (MISCELLANEOUS) ×3
TROCAR PORT AIRSEAL 8X120 (TROCAR) ×3 IMPLANT
WATER STERILE IRR 1000ML POUR (IV SOLUTION) ×3 IMPLANT

## 2019-12-14 NOTE — Progress Notes (Signed)
Pt admitted surgery center [facility] @ 2104, very lethargic.  Been assisted cough, IS encouraged, po intake.  C/o Pain level is 5/10, cold. Applied heating pad, warm blanket.  Drinking hot tea now. CBG 163 @ 2145. Pt stated not took DM med today. Offered to go to BR. Refused for now. Will encourage to ambulate after eating soup.

## 2019-12-14 NOTE — Progress Notes (Signed)
Assisted pt. To BR, unstable, wobbly. C/o dizziness. Kept closed eyes, that reminded her open eyes. Void 100 urine. Notified Dr. Cletis Media, received orders for staying tonight. . CGB check after meal 2hrs later, pt had not been eating, no need DM med for tonight. Tylenol for pain order received.

## 2019-12-14 NOTE — Transfer of Care (Signed)
Immediate Anesthesia Transfer of Care Note  Patient: Nancy Wagner  Procedure(s) Performed: XI ROBOTIC ASSISTED SALPINGO OOPHORECTOMY (Bilateral )  Patient Location: PACU  Anesthesia Type:General  Level of Consciousness: awake, alert  and oriented  Airway & Oxygen Therapy: Patient Spontanous Breathing and Patient connected to face mask oxygen  Post-op Assessment: Report given to RN and Post -op Vital signs reviewed and stable  Post vital signs: Reviewed and stable  Last Vitals:  Vitals Value Taken Time  BP 128/75 12/14/19 1948  Temp    Pulse 59 12/14/19 1950  Resp 13 12/14/19 1950  SpO2 100 % 12/14/19 1950  Vitals shown include unvalidated device data.  Last Pain:  Vitals:   12/14/19 1620  TempSrc: Oral  PainSc:       Patients Stated Pain Goal: 4 ([id number])  Complications: No apparent anesthesia complications

## 2019-12-14 NOTE — Op Note (Signed)
Preoperative diagnosis: Hyperandrogenism  Postoperative diagnosis: same  Anesthesia: General   Anesthesiologist: Dr. Valma Cava  Procedure: Robotically assisted bilateral salpyngo-oophorectomy  Surgeon: Dr. Katharine Look Charlean Carneal   Assistant: Earnstine Regal P.A.-C .  Estimated blood loss: minimal  Procedure:   After being informed of the planned procedure with possible complications including but not limited to bleeding, infection, injury to other organs, need for laparotomy, expected hospital stay and recovery, informed consent is obtained and patient is taken to or #3. She is placed in lithotomy position on Trengard with both arms padded and tucked on each side and bilateral knee-high sequential compressive devices. She is given general anesthesia with endotracheal intubation without any complication. She is prepped and draped in a sterile fashion. A Foley catheter is inserted in her bladder.  Pelvic exam reveals: small retroverted uterus with 2 normal adnexa  A weighted speculum is inserted in the vagina and the anterior lip of the cervix is grasped with a tenaculum forcep. We proceed with a paracervical block and vaginal infiltration using ropivacaine 0.5% diluted 1 in 1 with saline. The uterus was then sounded at 7 cm. We easily dilate the cervix using Hegar dilator to #27 which allows for easy placement of the intrauterine ZUMI manipulator.  Trocar placement is decided. We infiltrate at the umbilicus with 10 cc of ropivacaine per protocol and perform a 10 mm vertical incision which is brought down bluntly to the fascia. The fascia is identified and grasped with Coker forceps. The fascia is incised with Mayo scissors. Peritoneum is entered bluntly. A pursestring suture of 0 Vicryl is placed on the fascia and a 10 mm Hassan trocar is easily inserted in the abdominal cavity held in placed with a Purstring suture. This allows for easy insufflation of a pneumoperitoneum using warmed CO2 at a maximum  pressure of 15 mm of mercury. . We then place one 34mm robotic trocar on the left, one 19mm robotic trocar on the right and one 8 mm patient's side Air-Seal assistant trocar on the right after infiltrating every site with ropivacaine per protocol. The robot is docked on the right of the patient after positioning her in Trendelenburg. A VesselSeal is inserted in arm #4 and a long bipolar forcep is inserted in arm #2.  Preparation and docking is completed in 1 hour 40 minutes. Docking required trouble shooting the XI with incapability to target surgical area. Target was bypassed and we proceeded safely with BSO.   Observation: Both ovaries are normal. Both tubes are normal with s/p sterilization. Uterus is normal. Dense adhesions between the omentum and the abdominal wall are avoided.Appendix and liver are not seen due to adhesions.   We Seal and section the right infundibulopelvic ligament with the right ureter under direct visualization. We Seal  and section cornual portion of the tube. We Seal and section the utero-ovarian ligament. The ovary is now freed with its tube and deposited in the posterior cul-de-sac. We proceed in the exact same fashion on the left side, also with direct visualization of the left ureter. Hemostasis is adequate.  Console time is 20 minutes. Instruments are removed and the robot is undocked.  The  mm umbilical trocar is replaced by a 10 mm trocar for insertion of an Endobag.   Both ovaries with tubes are placed in the bag and exteriorized through the umbilical incision.  We then irrigate the pelvis profusely with warm saline and confirm satisfactory hemostasis.  All instruments are then removed and the robot is undocked.  All trochars are removed under direct visualization after evacuating the pneumoperitoneum.   The fascia of the umbilical incision is closed with the previously placed pursestring suture of 0 Vicryl.  All incisions are then closed with subcuticular  suture of 3-0 Monocryl.   A speculum is inserted in the vagina to confirm adequate hemostasis on the cervix.   Instrument and sponge count is complete x2. Estimated blood loss is minimal.   The procedure is well tolerated by the patient who is taken to recovery room in a well and stable condition.   Specimen: Both ovaries and both tubes sent to pathology.

## 2019-12-14 NOTE — Anesthesia Preprocedure Evaluation (Addendum)
Anesthesia Evaluation  Patient identified by MRN, date of birth, ID band Patient awake    Reviewed: Allergy & Precautions, NPO status , Patient's Chart, lab work & pertinent test results, reviewed documented beta blocker date and time   Airway Mallampati: III  TM Distance: >3 FB Neck ROM: Full    Dental no notable dental hx. (+) Teeth Intact, Dental Advisory Given   Pulmonary asthma ,  OSA treated with UPPP in 1985   Pulmonary exam normal breath sounds clear to auscultation       Cardiovascular hypertension, Pt. on medications and Pt. on home beta blockers Normal cardiovascular exam Rhythm:Regular Rate:Normal     Neuro/Psych negative neurological ROS  negative psych ROS   GI/Hepatic Neg liver ROS, GERD  Controlled and Medicated,  Endo/Other  diabetes, Poorly Controlled, Type 2, Oral Hypoglycemic AgentsObesity BMI 36 A1c 9.1, recently increased oral meds  Renal/GU Renal InsufficiencyRenal diseaseCr 1.07, CrCL 35  Female GU complaint Persistent elevation of testosterone Hx uterine fibroids, cervical stenosis     Musculoskeletal  (+) Arthritis , Rheumatoid disorders,    Abdominal (+) + obese,   Peds negative pediatric ROS (+)  Hematology negative hematology ROS (+)   Anesthesia Other Findings HLD  Reproductive/Obstetrics negative OB ROS                            Anesthesia Physical  Anesthesia Plan  ASA: III  Anesthesia Plan: General   Post-op Pain Management:    Induction: Intravenous  PONV Risk Score and Plan: 4 or greater and Ondansetron, Dexamethasone, Midazolam, Scopolamine patch - Pre-op and Treatment may vary due to age or medical condition  Airway Management Planned: Oral ETT  Additional Equipment: None  Intra-op Plan:   Post-operative Plan: Extubation in OR  Informed Consent: I have reviewed the patients History and Physical, chart, labs and discussed the procedure  including the risks, benefits and alternatives for the proposed anesthesia with the patient or authorized representative who has indicated his/her understanding and acceptance.     Dental advisory given  Plan Discussed with: CRNA  Anesthesia Plan Comments:         Anesthesia Quick Evaluation

## 2019-12-14 NOTE — Anesthesia Procedure Notes (Signed)
Procedure Name: Intubation Date/Time: 12/14/2019 4:58 PM Performed by: Cynda Familia, CRNA Pre-anesthesia Checklist: Patient identified, Emergency Drugs available, Suction available and Patient being monitored Patient Re-evaluated:Patient Re-evaluated prior to induction Oxygen Delivery Method: Circle System Utilized Preoxygenation: Pre-oxygenation with 100% oxygen Induction Type: IV induction Ventilation: Mask ventilation without difficulty Laryngoscope Size: Miller and 2 Grade View: Grade I Tube type: Oral Tube size: 7.0 mm Number of attempts: 1 Airway Equipment and Method: Stylet Placement Confirmation: ETT inserted through vocal cords under direct vision,  positive ETCO2 and breath sounds checked- equal and bilateral Secured at: 21 cm Tube secured with: Tape Dental Injury: Teeth and Oropharynx as per pre-operative assessment  Comments: Smooth IV induction Houser-- intubation AM CRNA atraumatic-- teeth and mouth as preop -- bilat BS

## 2019-12-15 ENCOUNTER — Encounter: Payer: Self-pay | Admitting: *Deleted

## 2019-12-15 DIAGNOSIS — D259 Leiomyoma of uterus, unspecified: Secondary | ICD-10-CM | POA: Diagnosis not present

## 2019-12-15 DIAGNOSIS — L708 Other acne: Secondary | ICD-10-CM | POA: Diagnosis not present

## 2019-12-15 DIAGNOSIS — D4959 Neoplasm of unspecified behavior of other genitourinary organ: Secondary | ICD-10-CM | POA: Diagnosis not present

## 2019-12-15 DIAGNOSIS — L680 Hirsutism: Secondary | ICD-10-CM | POA: Diagnosis not present

## 2019-12-15 DIAGNOSIS — E281 Androgen excess: Secondary | ICD-10-CM | POA: Diagnosis not present

## 2019-12-15 DIAGNOSIS — M069 Rheumatoid arthritis, unspecified: Secondary | ICD-10-CM | POA: Diagnosis not present

## 2019-12-15 DIAGNOSIS — N950 Postmenopausal bleeding: Secondary | ICD-10-CM | POA: Diagnosis not present

## 2019-12-15 DIAGNOSIS — M109 Gout, unspecified: Secondary | ICD-10-CM | POA: Diagnosis not present

## 2019-12-15 DIAGNOSIS — N840 Polyp of corpus uteri: Secondary | ICD-10-CM | POA: Diagnosis not present

## 2019-12-15 MED ORDER — MENTHOL 3 MG MT LOZG
1.0000 | LOZENGE | OROMUCOSAL | Status: DC | PRN
  Administered 2019-12-15: 3 mg via ORAL
  Filled 2019-12-15: qty 9

## 2019-12-15 MED ORDER — KETOROLAC TROMETHAMINE 30 MG/ML IJ SOLN
15.0000 mg | Freq: Once | INTRAMUSCULAR | Status: AC
  Administered 2019-12-15: 08:00:00 15 mg via INTRAVENOUS
  Filled 2019-12-15: qty 1

## 2019-12-15 MED ORDER — MENTHOL 3 MG MT LOZG
LOZENGE | OROMUCOSAL | Status: AC
  Filled 2019-12-15: qty 9

## 2019-12-15 MED ORDER — KETOROLAC TROMETHAMINE 30 MG/ML IJ SOLN
INTRAMUSCULAR | Status: AC
  Filled 2019-12-15: qty 1

## 2019-12-15 NOTE — Progress Notes (Signed)
Nancy Wagner is a33 y.o.  [id number]  Post Op Date # 1: Robot Assisted Laparoscopic BSO  Subjective: Patient is Doing well postoperatively. Patient has Pain is controlled with current analgesics. Medications being used: acetaminophen. Patient reports some right lower quadrant pelvic pain but otherwise no discomfort. She is tolerating a regular diet, ambulating in the halls and voiding without difficulty    Objective: Vital signs in last 24 hours: Temp:  [97.4 F (36.3 C)-98.5 F (36.9 C)] 98.5 F (36.9 C) (01/15 0528) Pulse Rate:  [55-69] 69 (01/15 0528) Resp:  [12-18] 16 (01/15 0528) BP: (120-149)/(58-75) 120/59 (01/15 0528) SpO2:  [94 %-100 %] 94 % (01/15 0528) Weight:  [91.8 kg] 91.8 kg (01/14 1036)  Intake/Output from previous day: 01/14 0701 - 01/15 0700 In: 2375 [P.O.:420; I.V.:1855] Out: 525 [Urine:500] Intake/Output this shift: No intake/output data recorded. Recent Labs  Lab 12/11/19 0937  WBC 8.0  HGB 12.8  HCT 40.5  PLT 226     Recent Labs  Lab 12/11/19 0937  NA 140  K 3.8  CL 101  CO2 28  BUN 14  CREATININE 1.07*  CALCIUM 9.5  GLUCOSE 153*    EXAM: General: alert, cooperative and no distress Resp: clear to auscultation bilaterally Cardio: regular rate and rhythm, S1, S2 normal, no murmur, click, rub or gallop GI: bowel sounds present, soft with tenderness in left lower quadrant without guarding or rebound. Extremities: Homans sign is negative, no sign of DVT and SCD hose in place and functioning; no calf tenderness.   Assessment: s/p Procedure(s): XI ROBOTIC ASSISTED SALPINGO OOPHORECTOMY: stable, progressing well and tolerating diet  Plan: Discharge home  LOS: 0 days    Earnstine Regal, PA-C 12/15/2019 8:00 AM

## 2019-12-15 NOTE — Progress Notes (Signed)
Late entry.  Pt  tx from main [facility] PACU. When arrival, unable to charting from [facility] system due to cancel the case from surgery center.  Op done at main [facility] hospital OR,  Recovered [facility] PACU. Marland Kitchen This RN went to [place] system to get charting  And order for staying over night, some how, unable to scan med. Notified administration dept. And AC in [facility] hospital.

## 2019-12-15 NOTE — Discharge Instructions (Signed)
Call [facility] @ [phone] if:  You have a temperature greater than or equal to 100.4 degrees Farenheit orally You have pain that is not made better by the pain medication given and taken as directed You have excessive bleeding or problems urinating  Take Colace (Docusate Sodium/Stool Softener) 100 mg 2-3 times daily while taking narcotic pain medicine to avoid constipation or until bowel movements are regular. Take Ibuprofen 400 mg with food every 6 hours for 5 days then as needed for pain;  take with Ibuprofen Acetaminophen 500 mg  #2 tablets every 6 hours  If desired, may purchase the Salonpas 4% Lidocaine Patch for right lower abdominal pain.  You may drive after  36 hours You may walk up steps You may shower tomorrow  You may resume a regular diet Keep incisions clean and dry Do not lift over 15 pounds for 6 weeks Avoid anything in vagina  until after your post-operative visit

## 2019-12-15 NOTE — Progress Notes (Signed)
Ambulated in the hall with  Assisted. Tolerated well. Used BR, when pt back to bed, c/o tenderness Lt Abdomen. Swollen Lt Lower abd noted. Will keep eye on it. Change to IV to NSL. Attempted transfer pt in [place] in epic system, failed.

## 2019-12-17 NOTE — Anesthesia Postprocedure Evaluation (Signed)
Anesthesia Post Note  Patient: Nancy Wagner  Procedure(s) Performed: XI ROBOTIC ASSISTED SALPINGO OOPHORECTOMY (Bilateral )     Patient location during evaluation: PACU Anesthesia Type: General Level of consciousness: awake and alert Pain management: pain level controlled Vital Signs Assessment: post-procedure vital signs reviewed and stable Respiratory status: spontaneous breathing, nonlabored ventilation, respiratory function stable and patient connected to nasal cannula oxygen Cardiovascular status: blood pressure returned to baseline and stable Postop Assessment: no apparent nausea or vomiting Anesthetic complications: no    Last Vitals:  Vitals:   12/15/19 0528 12/15/19 0843  BP: (!) 120/59 120/64  Pulse: 69 68  Resp: 16 16  Temp: 36.9 C 36.8 C  SpO2: 94% 96%    Last Pain:  Vitals:   12/15/19 0843  TempSrc:   PainSc: 3                  [place]et [place]

## 2019-12-22 DIAGNOSIS — R7989 Other specified abnormal findings of blood chemistry: Secondary | ICD-10-CM | POA: Diagnosis not present

## 2019-12-22 DIAGNOSIS — T8189XA Other complications of procedures, not elsewhere classified, initial encounter: Secondary | ICD-10-CM | POA: Diagnosis not present

## 2019-12-26 LAB — SURGICAL PATHOLOGY

## 2019-12-28 ENCOUNTER — Other Ambulatory Visit: Payer: Self-pay | Admitting: Internal Medicine

## 2020-01-10 DIAGNOSIS — N898 Other specified noninflammatory disorders of vagina: Secondary | ICD-10-CM | POA: Diagnosis not present

## 2020-01-10 DIAGNOSIS — Z09 Encounter for follow-up examination after completed treatment for conditions other than malignant neoplasm: Secondary | ICD-10-CM | POA: Diagnosis not present

## 2020-02-23 ENCOUNTER — Telehealth: Payer: Self-pay | Admitting: Internal Medicine

## 2020-02-23 NOTE — Telephone Encounter (Signed)
Spoke with patient top let her know we need to schedule her CPE and labs, she was having a meeting and will call back on Tuesday to schedule. CPE due after 05/25/20

## 2020-02-27 NOTE — Telephone Encounter (Signed)
Appointment scheduled.

## 2020-03-07 ENCOUNTER — Other Ambulatory Visit: Payer: Self-pay | Admitting: Internal Medicine

## 2020-03-27 ENCOUNTER — Other Ambulatory Visit: Payer: Self-pay | Admitting: Internal Medicine

## 2020-04-04 ENCOUNTER — Other Ambulatory Visit: Payer: Self-pay | Admitting: Internal Medicine

## 2020-04-16 ENCOUNTER — Other Ambulatory Visit: Payer: Self-pay | Admitting: Internal Medicine

## 2020-04-24 ENCOUNTER — Other Ambulatory Visit: Payer: Self-pay | Admitting: Internal Medicine

## 2020-05-17 DIAGNOSIS — H04123 Dry eye syndrome of bilateral lacrimal glands: Secondary | ICD-10-CM | POA: Diagnosis not present

## 2020-05-17 DIAGNOSIS — E119 Type 2 diabetes mellitus without complications: Secondary | ICD-10-CM | POA: Diagnosis not present

## 2020-05-17 DIAGNOSIS — H2513 Age-related nuclear cataract, bilateral: Secondary | ICD-10-CM | POA: Diagnosis not present

## 2020-05-17 DIAGNOSIS — H524 Presbyopia: Secondary | ICD-10-CM | POA: Diagnosis not present

## 2020-05-17 LAB — HM DIABETES EYE EXAM

## 2020-05-21 ENCOUNTER — Encounter: Payer: Self-pay | Admitting: Internal Medicine

## 2020-05-23 ENCOUNTER — Other Ambulatory Visit: Payer: Self-pay

## 2020-05-23 ENCOUNTER — Other Ambulatory Visit: Payer: Medicare PPO | Admitting: Internal Medicine

## 2020-05-23 DIAGNOSIS — I10 Essential (primary) hypertension: Secondary | ICD-10-CM | POA: Diagnosis not present

## 2020-05-23 DIAGNOSIS — E8881 Metabolic syndrome: Secondary | ICD-10-CM

## 2020-05-23 DIAGNOSIS — M1712 Unilateral primary osteoarthritis, left knee: Secondary | ICD-10-CM | POA: Diagnosis not present

## 2020-05-23 DIAGNOSIS — D890 Polyclonal hypergammaglobulinemia: Secondary | ICD-10-CM | POA: Diagnosis not present

## 2020-05-23 DIAGNOSIS — E1169 Type 2 diabetes mellitus with other specified complication: Secondary | ICD-10-CM

## 2020-05-23 DIAGNOSIS — E1165 Type 2 diabetes mellitus with hyperglycemia: Secondary | ICD-10-CM

## 2020-05-23 DIAGNOSIS — K862 Cyst of pancreas: Secondary | ICD-10-CM

## 2020-05-23 DIAGNOSIS — E785 Hyperlipidemia, unspecified: Secondary | ICD-10-CM | POA: Diagnosis not present

## 2020-05-24 LAB — CBC WITH DIFFERENTIAL/PLATELET
Absolute Monocytes: 606 cells/uL (ref 200–950)
Basophils Absolute: 51 cells/uL (ref 0–200)
Basophils Relative: 0.7 %
Eosinophils Absolute: 226 cells/uL (ref 15–500)
Eosinophils Relative: 3.1 %
HCT: 38.1 % (ref 35.0–45.0)
Hemoglobin: 12.3 g/dL (ref 11.7–15.5)
Lymphs Abs: 3190 cells/uL (ref 850–3900)
MCH: 29.8 pg (ref 27.0–33.0)
MCHC: 32.3 g/dL (ref 32.0–36.0)
MCV: 92.3 fL (ref 80.0–100.0)
MPV: 11.7 fL (ref 7.5–12.5)
Monocytes Relative: 8.3 %
Neutro Abs: 3227 cells/uL (ref 1500–7800)
Neutrophils Relative %: 44.2 %
Platelets: 219 10*3/uL (ref 140–400)
RBC: 4.13 10*6/uL (ref 3.80–5.10)
RDW: 13.1 % (ref 11.0–15.0)
Total Lymphocyte: 43.7 %
WBC: 7.3 10*3/uL (ref 3.8–10.8)

## 2020-05-24 LAB — COMPLETE METABOLIC PANEL WITH GFR
AG Ratio: 1 (calc) (ref 1.0–2.5)
ALT: 28 U/L (ref 6–29)
AST: 27 U/L (ref 10–35)
Albumin: 4 g/dL (ref 3.6–5.1)
Alkaline phosphatase (APISO): 47 U/L (ref 37–153)
BUN/Creatinine Ratio: 16 (calc) (ref 6–22)
BUN: 16 mg/dL (ref 7–25)
CO2: 34 mmol/L — ABNORMAL HIGH (ref 20–32)
Calcium: 10.2 mg/dL (ref 8.6–10.4)
Chloride: 100 mmol/L (ref 98–110)
Creat: 0.97 mg/dL — ABNORMAL HIGH (ref 0.60–0.93)
GFR, Est African American: 69 mL/min/{1.73_m2} (ref >=60)
GFR, Est Non African American: 59 mL/min/{1.73_m2} — ABNORMAL LOW (ref >=60)
Globulin: 3.9 g/dL (calc) — ABNORMAL HIGH (ref 1.9–3.7)
Glucose, Bld: 139 mg/dL — ABNORMAL HIGH (ref 65–99)
Potassium: 4.1 mmol/L (ref 3.5–5.3)
Sodium: 140 mmol/L (ref 135–146)
Total Bilirubin: 0.4 mg/dL (ref 0.2–1.2)
Total Protein: 7.9 g/dL (ref 6.1–8.1)

## 2020-05-24 LAB — LIPID PANEL
Cholesterol: 169 mg/dL (ref <200)
HDL: 60 mg/dL (ref >=50)
LDL Cholesterol (Calc): 90 mg/dL (calc)
Non-HDL Cholesterol (Calc): 109 mg/dL (calc) (ref <130)
Total CHOL/HDL Ratio: 2.8 (calc) (ref <5.0)
Triglycerides: 101 mg/dL (ref <150)

## 2020-05-24 LAB — HEMOGLOBIN A1C
Hgb A1c MFr Bld: 7.3 % of total Hgb — ABNORMAL HIGH (ref <5.7)
Mean Plasma Glucose: 163 (calc)
eAG (mmol/L): 9 (calc)

## 2020-05-24 LAB — TSH: TSH: 1.63 mIU/L (ref 0.40–4.50)

## 2020-05-27 ENCOUNTER — Encounter: Payer: Self-pay | Admitting: Internal Medicine

## 2020-05-27 ENCOUNTER — Other Ambulatory Visit: Payer: Self-pay

## 2020-05-27 ENCOUNTER — Ambulatory Visit: Payer: Medicare PPO | Admitting: Internal Medicine

## 2020-05-27 VITALS — BP 110/80 | HR 66 | Ht 62.75 in | Wt 194.0 lb

## 2020-05-27 DIAGNOSIS — Z8739 Personal history of other diseases of the musculoskeletal system and connective tissue: Secondary | ICD-10-CM | POA: Diagnosis not present

## 2020-05-27 DIAGNOSIS — M1712 Unilateral primary osteoarthritis, left knee: Secondary | ICD-10-CM | POA: Diagnosis not present

## 2020-05-27 DIAGNOSIS — E1169 Type 2 diabetes mellitus with other specified complication: Secondary | ICD-10-CM

## 2020-05-27 DIAGNOSIS — E8881 Metabolic syndrome: Secondary | ICD-10-CM

## 2020-05-27 DIAGNOSIS — E049 Nontoxic goiter, unspecified: Secondary | ICD-10-CM

## 2020-05-27 DIAGNOSIS — E785 Hyperlipidemia, unspecified: Secondary | ICD-10-CM

## 2020-05-27 DIAGNOSIS — E1165 Type 2 diabetes mellitus with hyperglycemia: Secondary | ICD-10-CM

## 2020-05-27 DIAGNOSIS — I10 Essential (primary) hypertension: Secondary | ICD-10-CM | POA: Diagnosis not present

## 2020-05-27 DIAGNOSIS — Z0000 Encounter for general adult medical examination without abnormal findings: Secondary | ICD-10-CM | POA: Diagnosis not present

## 2020-05-27 DIAGNOSIS — J301 Allergic rhinitis due to pollen: Secondary | ICD-10-CM

## 2020-05-27 LAB — POCT URINALYSIS DIPSTICK
Appearance: NEGATIVE
Bilirubin, UA: NEGATIVE
Blood, UA: NEGATIVE
Glucose, UA: NEGATIVE
Ketones, UA: NEGATIVE
Leukocytes, UA: NEGATIVE
Nitrite, UA: NEGATIVE
Odor: NEGATIVE
Protein, UA: POSITIVE — AB
Spec Grav, UA: 1.015 (ref 1.010–1.025)
Urobilinogen, UA: 0.2 E.U./dL
pH, UA: 6.5 (ref 5.0–8.0)

## 2020-05-27 NOTE — Progress Notes (Signed)
Subjective:    Patient ID: Nancy Wagner, female    DOB: October 04, 1951, 69 y.o.   MRN: [id number]  HPI  69 year old Female for health maintenance exam and evaluation of medical issues.  Patient had surgery by Dr. Cletis Media to remove tumor causing elevated tetstosterone in January 2021..  This  included a robotic assisted salpingo-oophorectomy.  Surgical pathology revealed steroid cell tumor, Leydig cell type in 103.  Since surgery current testosterone level has normalized she says.  She has recovered from surgery and doing well.  Her granddaughter will be attending [employer] this fall and will be living on campus.  Currently living with patient.  Another granddaughter wants to come from [place] to live with her.  Hgn AIC stable at 7.1 %.  Blood pressure stable at 110/80 on multidrug regimen.  On palpation I believe her thyroid is slightly enlarged.  She did have thyroid ultrasound in 2009 by Dr. Hillary Bow showing minimally heterogeneous and enlarged thyroid.  Will be sent for ultrasound.  Last ultrasound was done in 2009 with no nodules being identified.  Order was placed.  Patient complaining of right hand issues.  Index and third finger are numb.  Patient says her right thumb clicks at IP joint.  I have recommended she see orthopedist regarding this.  She is right-handed.  History of elevated serum proteins.  She was referred to oncology in May 2019.  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  She is asymptomatic.  Had colonoscopy June 2019 with 10-year follow-up recommended.  She is intolerant of codeine-causes rash and hives.  Had pneumonia in 1977, throat surgery by Dr. Ernesto Rutherford in 1982 and 2 C-sections in 1975 in 1978.  Social history: Married.  Husband formerly worked as a Land but is now Brewing technologist at [employer].  Prior to the  pandemic she went to the gym a couple of times a week.  Does not smoke or consume alcohol.  2 adult children, a son and a daughter.  In 2012 she had left knee injected with steroids for osteoarthritis.  Family history: Father in his late 14s and mother in her 81s in fairly good health.  4 brothers.  1 brother died of complications of cancer.  4 sisters-3 of whom are in good health but 1 sister with history of MI and other health issues.    Review of Systems  Constitutional: Negative.   HENT: Negative.   Respiratory: Negative.   Cardiovascular: Negative.   Musculoskeletal:       See issues with right thumb and 2 right fingers  Neurological:       Complaining of numbness right second and third finger  Psychiatric/Behavioral: Negative.        Objective:   Physical Exam Blood pressure 110/80 pulse 66 BMI 34.64 pulse oximetry 98% weight 194 pounds  Skin warm and dry.  No cervical adenopathy.  Mild diffuse thyromegaly.  Ultrasound will be ordered.  No carotid bruits.  Chest clear to auscultation.  Cardiac exam regular rate and rhythm 1/6 systolic ejection murmur abdomen soft nondistended without hepatosplenomegaly masses or tenderness.  GYN exam deferred to GYN physician.  No lower extremity edema.  Neuro intact without focal deficits.  Affect thought and judgment are normal.       Assessment & Plan:  Numbness right second and third fingers-?  Peripheral neuropathy-have orthopedist check  Arthropathy right thumb  at [place] to orthopedist  Essential hypertension stable on multidrug regimen  History of IgM and IgA polygammopathy.  Has been seen by hematologist who did not think this process was myeloma but likely an autoimmune disease.  She has history of positive ANA and a positive rheumatoid factor.  In 2019 she had IgA of 949 and IgM of 705.  Had mild increase in kappa and lambda light chains.  No M spike.  Saw oncologist in 2019.  We have not evaluated her for lupus or  rheumatoid arthritis as suggested by oncologist but we can do these.  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  Has been seen at [facility] and follow-up with Dr. Satira Sark at [facility] ophthalmology.  Osteoarthritis left knee treated with NSAIDs  Metabolic syndrome  Morbid obesity-BMI 35.96.  Patient hopes to get back into the gym soon  Hyperlipidemia treated with simvastatin-normal lipid panel on statin  Type 2 diabetes mellitus-stable at 7.3% and previously was 9.1% in September 2020.  Continue to work on diet continue Janumet XR [phone] tablet daily with breakfast and glipizide 5 mg daily with breakfast  History of allergic rhinitis treated with Zyrtec  History of gout treated with Zyloprim  Enlarged thyroid-ultrasound ordered  History of an elevated creatinine at 1.07 but this is improved and is now 0.97  Plan: Continue current medications.  Work on diet exercise and weight loss and follow-up in 6 months.  Patient to call [facility] orthopedics to see hand surgeon regarding issues with thumb, second and third fingers.  Subjective:   Patient presents for Medicare Annual/Subsequent preventive examination.  Review Past Medical/Family/Social: See above   Risk Factors  Current exercise habits: Walks some and has not been back to the gym lately.  Is mostly sedentary I suspect Dietary issues discussed: Low-fat low carbohydrate  Cardiac risk factors: Hyperlipidemia and impaired glucose tolerance  Depression Screen  (Note: if answer to either of the following is "Yes", a more complete depression screening is indicated)   Over the past two weeks, have you felt down, depressed or hopeless? No  Over the past two weeks, have you felt little interest or pleasure in doing things? No Have you lost interest or pleasure in daily life? No Do you often feel hopeless? No Do you cry easily over simple problems? No   Activities of Daily Living  In your present  state of health, do you have any difficulty performing the following activities?:   Driving? No  Managing money? No  Feeding yourself? No  Getting from bed to chair? No  Climbing a flight of stairs? No  Preparing food and eating?: No  Bathing or showering? No  Getting dressed: No  Getting to the toilet? No  Using the toilet:No  Moving around from place to place: No  In the past year have you fallen or had a near fall?:No  Are you sexually active? No  Do you have more than one partner? No   Hearing Difficulties: No  Do you often ask people to speak up or repeat themselves? No  Do you experience ringing or noises in your ears? No  Do you have difficulty understanding soft or whispered voices? No  Do you feel that you have a problem with memory? No Do you often misplace items? No    Home Safety:  Do you have a smoke alarm at your residence? Yes Do you have grab bars in the bathroom?  Yes Do you have throw rugs  in your house?  Yes   Cognitive Testing  Alert? Yes Normal Appearance?Yes  Oriented to person? Yes Place? Yes  Time? Yes  Recall of three objects? Yes  Can perform simple calculations? Yes  Displays appropriate judgment?Yes  Can read the correct time from a watch face?Yes   List the Names of Other Physician/Practitioners you currently use:  See referral list for the physicians patient is currently seeing.     Review of Systems: See above   Objective:     General appearance: Appears stated age and  obese  Head: Normocephalic, without obvious abnormality, atraumatic  Eyes: conj clear, EOMi PEERLA  Ears: normal TM's and external ear canals both ears  Nose: Nares normal. Septum midline. Mucosa normal. No drainage or sinus tenderness.  Throat: lips, mucosa, and tongue normal; teeth and gums normal  Neck: no adenopathy, no carotid bruit, no JVD, supple, symmetrical, trachea midline and thyroid not enlarged, symmetric, no tenderness/mass/nodules  No CVA  tenderness.  Lungs: clear to auscultation bilaterally  Breasts: normal appearance, no masses Heart: regular rate and rhythm, S1, S2 normal, no murmur, click, rub or gallop  Abdomen: soft, non-tender; bowel sounds normal; no masses, no organomegaly  Musculoskeletal: ROM normal in all joints, no crepitus, no deformity, Normal muscle strengthen. Back  is symmetric, no curvature. Skin: Skin color, texture, turgor normal. No rashes or lesions  Lymph nodes: Cervical, supraclavicular, and axillary nodes normal.  Neurologic: CN 2 -12 Normal, Normal symmetric reflexes. Normal coordination and gait  Psych: Alert & Oriented x 3, Mood appear stable.    Assessment:    Annual wellness medicare exam   Plan:    During the course of the visit the patient was educated and counseled about appropriate screening and preventive services including:   Annual mammogram  Annual flu vaccine  Colonoscopy up-to-date  Has had 2 COVID-19 vaccines  Tdap up-to-date     Patient Instructions (the written plan) was given to the patient.  Medicare Attestation  I have personally reviewed:  The patient's medical and social history  Their use of alcohol, tobacco or illicit drugs  Their current medications and supplements  The patient's functional ability including ADLs,fall risks, home safety risks, cognitive, and hearing and visual impairment  Diet and physical activities  Evidence for depression or mood disorders  The patient's weight, height, BMI, and visual acuity have been recorded in the chart. I have made referrals, counseling, and provided education to the patient based on review of the above and I have provided the patient with a written personalized care plan for preventive services.         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

## 2020-05-27 NOTE — Patient Instructions (Signed)
Your glucose control has improved considerably.  Continue diet and exercise efforts.  Please try to lose some weight.  Try to walk some.  Go back to the gym when it is safe.  Return in 6 months.  See orthopedist regarding issues with right thumb and right index and third fingers.  Have annual mammogram.  Have annual flu vaccine this fall.

## 2020-05-28 ENCOUNTER — Other Ambulatory Visit: Payer: Self-pay

## 2020-05-28 DIAGNOSIS — D890 Polyclonal hypergammaglobulinemia: Secondary | ICD-10-CM

## 2020-05-28 LAB — MICROALBUMIN / CREATININE URINE RATIO
Creatinine, Urine: 296 mg/dL — ABNORMAL HIGH (ref 20–275)
Microalb Creat Ratio: 11 mcg/mg creat (ref <30)
Microalb, Ur: 3.2 mg/dL

## 2020-05-28 NOTE — Addendum Note (Signed)
Addended by: Mady Haagensen on: 05/28/2020 11:35 AM   Modules accepted: Orders

## 2020-06-04 ENCOUNTER — Ambulatory Visit
Admission: EM | Admit: 2020-06-04 | Discharge: 2020-06-04 | Disposition: A | Discharge status: Home / Self Care | Payer: Medicare PPO | Attending: Physician Assistant | Admitting: Physician Assistant

## 2020-06-04 ENCOUNTER — Other Ambulatory Visit: Payer: Self-pay

## 2020-06-04 DIAGNOSIS — M79671 Pain in right foot: Secondary | ICD-10-CM | POA: Diagnosis not present

## 2020-06-04 MED ORDER — HYDROCODONE-ACETAMINOPHEN 5-325 MG PO TABS: 1.0000 | ORAL_TABLET | Freq: Every evening | ORAL | 0 refills | Status: DC | PRN

## 2020-06-04 MED ORDER — INDOMETHACIN 50 MG PO CAPS: 50.0000 mg | ORAL_CAPSULE | Freq: Three times a day (TID) | ORAL | 0 refills | Status: DC

## 2020-06-04 NOTE — Discharge Instructions (Signed)
Likely inflammation after an injury. Start indomethacin as directed. Norco at night if needing further pain relief. Continue ice compress, elevation, rest. If noticing spreading redness, increased warmth, fever, follow up for reevaluation. Otherwise, follow up with PCP if symptoms not improving.

## 2020-06-04 NOTE — ED Provider Notes (Signed)
[facility]    CSN: [id number] Arrival date & time: 06/04/20  1254      History   Chief Complaint Chief Complaint  Patient presents with  . Foot Pain    HPI Nancy Wagner is a 69 y.o. female.   69 year old female with history of asthma, gout, DM, RA, comes in for right foot pain x 3 days. Patient states prior to symptom onset, dropped a bottle of body wash onto top of foot. No significant pain at the time. However, she woke up the next day with swelling, erythema, pain to the area. Pain with light touch, worse with ROM and weight bearing. Pain can radiate up the leg during ambulation. Denies numbness/tingling. No spreading erythema, fever. Naproxen 426m BID without obvious relief.  Last a1c 7.3, down from 9      Past Medical History:  Diagnosis Date  . Asthma    controlled  (followed by pcp)  . Cervical stenosis (uterine cervix)   . 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 GERD (gastroesophageal reflux disease)    diet control  . Gout    12-08-2019  per pt last episode left foot 11/ 2020  . Hirsutism   . History of obstructive sleep apnea    s/p  UPPP in 1985  . Hypertension    followed by pcp  (12-08-2019  per pt had stress test many yrs ago, was told ok)  . Lichen simplex chronicus   . OA (osteoarthritis)   . RA (rheumatoid arthritis) ([facility]    12-08-2019  per pt was dx years ago, treated with high level prednisone for few weeks and some other treatment with her eyes and has been in remission since  . Type 2 diabetes mellitus ([facility]    followed by pcp ---  (12-08-2019 checks cbg's daily in am,  fasting-- 110 to 126)  . Uterine fibroid   . Wears glasses     Patient Active Problem List   Diagnosis Date Noted  . Hyperandrogenism 12/14/2019  . Elevated testosterone level 09/20/2018  . Dysproteinemia 04/20/2018  . PMB (postmenopausal bleeding) 04/04/2017  . Abnormal transvaginal ultrasound 04/04/2017  . Other and unspecified  hyperlipidemia 07/12/2014  . Controlled diabetes mellitus type II without complication ([facility] 054/65/0354 . Metabolic syndrome 065/68/1275 . History of vitamin D deficiency 08/24/2013  . Mixed hyperlipidemia 08/24/2013  . Obesity, unspecified 08/24/2013  . Osteoarthritis of left knee 08/24/2013  . Menopausal state 04/26/2012  . HBP (high blood pressure) 03/15/2012  . Diabetes mellitus type 2 in obese ([facility] 03/15/2012    Past Surgical History:  Procedure Laterality Date  . [facility] . CHOLECYSTECTOMY OPEN  1984  . COLONOSCOPY  last one 05-09-2018  . DILATATION & CURRETTAGE/HYSTEROSCOPY WITH RESECTOCOPE N/A 04/07/2017   Procedure: DILATATION & CURETTAGE/HYSTEROSCOPY WITH RESECTOCOPE;  Surgeon: HEldred Manges MD;  [facility]  Service: Gynecology;  Laterality: N/A;  . ROBOTIC ASSISTED SALPINGO OOPHERECTOMY Bilateral 12/14/2019   Procedure: XI ROBOTIC ASSISTED SALPINGO OOPHORECTOMY;  Surgeon: RDelsa Bern MD;  Location: [facility];  Service: Gynecology;  Laterality: Bilateral;  Bed held, but patient is expected to go home the same day. -ap  . THROAT SURGERY  1985   minimal uvulopalatopharyngoplasty for sleep apnea  . TUBAL LIGATION Bilateral 1981    OB History    Gravida  2   Para  2   Term  2   Preterm  0  AB  0   Living  2     SAB  0   TAB  0   Ectopic  0   Multiple  0   Live Births               Home Medications    Prior to Admission medications   Medication Sig Start Date End Date Taking? Authorizing Provider  allopurinol (ZYLOPRIM) 100 MG tablet TAKE 1 TABLET BY MOUTH EVERY DAY 04/24/20   Elby Showers, MD  amLODipine (NORVASC) 5 MG tablet TAKE 1 TABLET BY MOUTH EVERY DAY 04/04/20   Elby Showers, MD  atenolol (TENORMIN) 100 MG tablet TAKE 1 TABLET BY MOUTH EVERY DAY 03/27/20   Elby Showers, MD  Blood Glucose Monitoring Suppl (ACCU-CHEK GUIDE ME) w/Device KIT USE AS DIRECTED 04/16/20   Elby Showers, MD   cetirizine (ZYRTEC) 10 MG tablet Take 10 mg by mouth as needed.    [provider]  Cholecalciferol (VITAMIN D3) 50 MCG (2000 UT) TABS Take 2 tablets by mouth 2 (two) times daily.    [provider]  clobetasol cream (TEMOVATE) 1.61 % Apply 1 application topically 2 (two) times daily as needed.    [provider]  cloNIDine (CATAPRES) 0.1 MG tablet TAKE 1 TABLET BY MOUTH TWICE A DAY 12/11/19   Elby Showers, MD  cyclobenzaprine (FLEXERIL) 10 MG tablet TAKE ONE HALF TABLET BY MOUTH AT BEDTIME FOR MUSCULOSKELETAL PAIN Patient taking differently: Take 5 mg by mouth at bedtime as needed. TAKE ONE HALF TABLET BY MOUTH AT BEDTIME FOR MUSCULOSKELETAL PAIN 03/03/19   Elby Showers, MD  esomeprazole (NEXIUM) 40 MG capsule TAKE 1 CAPSULE BY MOUTH EVERY DAY Patient taking differently: Take 40 mg by mouth daily as needed.  09/27/19   Elby Showers, MD  fish oil-omega-3 fatty acids 1000 MG capsule Take 2 g by mouth at bedtime. Reported on 06/18/2016    [provider]  folic acid (FOLVITE) 096 MCG tablet Take 400 mcg by mouth at bedtime.     [provider]  glipiZIDE (GLUCOTROL XL) 5 MG 24 hr tablet TAKE 1 TABLET BY MOUTH EVERY DAY WITH BREAKFAST 03/07/20   Elby Showers, MD  HYDROcodone-acetaminophen (NORCO/VICODIN) 5-325 MG tablet Take 1 tablet by mouth at bedtime as needed. 06/04/20   Tasia Catchings, Dimitry Holsworth V, PA-C  indomethacin (INDOCIN) 50 MG capsule Take 1 capsule (50 mg total) by mouth 3 (three) times daily with meals. 06/04/20   Tasia Catchings, Magic Mohler V, PA-C  JANUMET XR [phone] MG TB24 TAKE 1 TABLET BY MOUTH EVERY DAY WITH BREAKFAST 12/13/19   Elby Showers, MD  Multiple Vitamins-Minerals (MULTIVITAMIN WOMEN 50+) TABS Take 1 tablet by mouth at bedtime.    [provider]  olmesartan-hydrochlorothiazide (BENICAR HCT) 40-25 MG tablet TAKE 1 TABLET BY MOUTH EVERY DAY 04/04/20   Elby Showers, MD  PROAIR HFA [phone] Base) MCG/ACT inhaler TAKE 2 PUFFS BY MOUTH EVERY 6 HOURS AS NEEDED FOR  WHEEZE OR SHORTNESS OF BREATH Patient taking differently: Inhale 2 puffs into the lungs every 6 (six) hours as needed.  01/28/19   Elby Showers, MD  simvastatin (ZOCOR) 20 MG tablet TAKE 1 TABLET BY MOUTH EVERY DAY 12/28/19   Elby Showers, MD  vitamin B-12 (CYANOCOBALAMIN) 100 MCG tablet Take 100 mcg by mouth at bedtime.     [provider]    Family History Family History  Problem Relation Age of Onset  .  Heart disease Sister     Social History Social History   Tobacco Use  . Smoking status: Never Smoker  . Smokeless tobacco: Never Used  Vaping Use  . Vaping Use: Never used  Substance Use Topics  . Alcohol use: Yes    Comment: very rare  . Drug use: Never     Allergies   Adhesive [tape], Codeine, and Other   Review of Systems Review of Systems  Reason unable to perform ROS: See HPI as above.     Physical Exam Triage Vital Signs ED Triage Vitals  Enc Vitals Group     BP 06/04/20 1308 (!) 146/74     Pulse Rate 06/04/20 1308 75     Resp 06/04/20 1308 16     Temp 06/04/20 1308 99 F (37.2 C)     Temp Source 06/04/20 1308 Oral     SpO2 06/04/20 1308 98 %     Weight --      Height --      Head Circumference --      Peak Flow --      Pain Score 06/04/20 1317 10     Pain Loc --      Pain Edu? --      Excl. in [place]? --    No data found.  Updated Vital Signs BP (!) 146/74 (BP Location: Right Arm)   Pulse 75   Temp 99 F (37.2 C) (Oral)   Resp 16   SpO2 98%   Physical Exam Constitutional:      General: She is not in acute distress.    Appearance: Normal appearance. She is well-developed. She is not toxic-appearing or diaphoretic.  HENT:     Head: Normocephalic and atraumatic.  Eyes:     Conjunctiva/sclera: Conjunctivae normal.     Pupils: Pupils are equal, round, and reactive to light.  Pulmonary:     Effort: Pulmonary effort is normal. No respiratory distress.     Comments: Speaking in full sentences without difficulty Musculoskeletal:      Cervical back: Normal range of motion and neck supple.     Comments: Swelling and erythema to the lateral foot with warmth. No induration. Tender to light touch. No swelling, erythema to the ankle. Full ROM of ankle and toes. Sensation intact. Pedal pulse 2+  Skin:    General: Skin is warm and dry.  Neurological:     Mental Status: She is alert and oriented to person, place, and time.      UC Treatments / Results  Labs (all labs ordered are listed, but only abnormal results are displayed) Labs Reviewed - No data to display  EKG   Radiology No results found.  Procedures Procedures (including critical care time)  Medications Ordered in UC Medications - No data to display  Initial Impression / Assessment and Plan / UC Course  I have reviewed the triage vital signs and the nursing notes.  Pertinent labs & imaging results that were available during my care of the patient were reviewed by me and considered in my medical decision making (see chart for details).    Likely inflammatory in nature for injury. Given patient recently reached a1c goal, will defer prednisone for now. NSAIDs as directed. Will provide norco as needed, patient has taken in the past without problems. Continue other symptomatic management. Return precautions given.  Final Clinical Impressions(s) / UC Diagnoses   Final diagnoses:  Right foot pain   ED Prescriptions    Medication  Sig Dispense Auth. Provider   indomethacin (INDOCIN) 50 MG capsule Take 1 capsule (50 mg total) by mouth 3 (three) times daily with meals. 21 capsule Nyheem Binette V, PA-C   HYDROcodone-acetaminophen (NORCO/VICODIN) 5-325 MG tablet Take 1 tablet by mouth at bedtime as needed. 10 tablet Ok Edwards, PA-C     I have reviewed the PDMP during this encounter.   Ok Edwards, PA-C 06/04/20 1400

## 2020-06-04 NOTE — ED Triage Notes (Signed)
Pt states dropped a body wash bottle on top of rt foot Saturday night. States having pain and swelling to rt foot shooting up rt leg.

## 2020-06-06 ENCOUNTER — Other Ambulatory Visit: Payer: Medicare PPO

## 2020-06-09 ENCOUNTER — Other Ambulatory Visit: Payer: Self-pay | Admitting: Internal Medicine

## 2020-06-10 ENCOUNTER — Ambulatory Visit
Admission: RE | Admit: 2020-06-10 | Discharge: 2020-06-10 | Disposition: A | Discharge status: Home / Self Care | Payer: Medicare PPO | Source: Ambulatory Visit | Attending: Internal Medicine | Admitting: Internal Medicine

## 2020-06-10 DIAGNOSIS — E049 Nontoxic goiter, unspecified: Secondary | ICD-10-CM

## 2020-06-10 DIAGNOSIS — E0590 Thyrotoxicosis, unspecified without thyrotoxic crisis or storm: Secondary | ICD-10-CM | POA: Diagnosis not present

## 2020-06-10 DIAGNOSIS — E042 Nontoxic multinodular goiter: Secondary | ICD-10-CM | POA: Diagnosis not present

## 2020-06-27 ENCOUNTER — Telehealth: Payer: Self-pay | Admitting: Internal Medicine

## 2020-06-27 NOTE — Telephone Encounter (Signed)
Nancy Wagner [phone]  Ellisha called to say that Tuesday and Wednesday she woke up sneezing then today she has had pressure, runny nose, ears stopped up, has taken some Benadryl, No fever, No COVID exposure that she knows of,  COVID vaccines March and April.

## 2020-06-27 NOTE — Telephone Encounter (Signed)
She needs to get a Covid test and isolate herself. The variant is in the community. If she is not coughing up discolored sputum or having discolored nasal drainage would just wait and see if symptoms worsen. Can keep taking Benadryl.

## 2020-06-27 NOTE — Telephone Encounter (Signed)
Scheduled car visit 

## 2020-06-28 ENCOUNTER — Other Ambulatory Visit: Payer: Self-pay

## 2020-06-28 ENCOUNTER — Encounter: Payer: Self-pay | Admitting: Internal Medicine

## 2020-06-28 ENCOUNTER — Ambulatory Visit (INDEPENDENT_AMBULATORY_CARE_PROVIDER_SITE_OTHER): Payer: Medicare PPO | Admitting: Internal Medicine

## 2020-06-28 VITALS — Ht 62.5 in | Wt 194.0 lb

## 2020-06-28 DIAGNOSIS — R05 Cough: Secondary | ICD-10-CM

## 2020-06-28 DIAGNOSIS — R059 Cough, unspecified: Secondary | ICD-10-CM

## 2020-06-28 DIAGNOSIS — J329 Chronic sinusitis, unspecified: Secondary | ICD-10-CM

## 2020-06-28 MED ORDER — AMOXICILLIN 500 MG PO CAPS: 500.0000 mg | ORAL_CAPSULE | Freq: Three times a day (TID) | ORAL | 0 refills | Status: DC

## 2020-06-28 NOTE — Patient Instructions (Signed)
It was a pleasure to see you today.  Please take amoxicillin 500 mg 3 times a day for 10 days.  Rest and drink plenty of fluids.  Call if not improving by Monday, August 2 or sooner if worse.

## 2020-06-28 NOTE — Progress Notes (Signed)
   Subjective:    Patient ID: Nancy Wagner, female    DOB: Dec 27, 1950, 69 y.o.   MRN: [id number]  HPI 69 year old Female seen today outside my office due to the Coronavirus pandemic and delta variant.  She is agreeable to visit in this format today.  She called yesterday complaining of sneezing, sinus pressure, runny nose congested ears.  She took Benadryl.  Has not had any fever and had no known Covid exposure that she was aware of.  Had 2 COVID-19 vaccines in April and March.  No shaking chills headache or dysgeusia.  No myalgias.  She is accompanied by her husband who is driving her today.  He is asymptomatic.  COVID-19 testing done today and results pending.  Main complaint sore scratchy throat cough and ears being stopped up.   Review of Systems no nausea vomiting or diarrhea.     Objective:   Physical Exam She is afebrile.  Blood pressure not taken.  She is seen outside of her car with my CMA present.  Her TMs are unremarkable.  Her pharynx is very slightly injected without any exudate.  Her neck is supple.  She is alert.  No audible wheezing when she is being told shortness of breath.  Her chest is clear to auscultation without rales or wheezing.       Assessment & Plan:  Acute upper respiratory infection-probable maxillary sinusitis  Plan: May continue with Benadryl for congestion since that works well for her.  COVID-19 testing performed and results pending.  Have prescribed amoxicillin 500 mg 3 times a day for 10 days for her.  Rest and drink plenty of fluids.  Call if symptoms worsen.  Monitor Accu-Cheks.

## 2020-06-29 LAB — SARS-COV-2 RNA,(COVID-19) QUALITATIVE NAAT: SARS CoV2 RNA: NOT DETECTED

## 2020-07-23 ENCOUNTER — Other Ambulatory Visit: Payer: Self-pay | Admitting: Internal Medicine

## 2020-07-23 DIAGNOSIS — Z1231 Encounter for screening mammogram for malignant neoplasm of breast: Secondary | ICD-10-CM

## 2020-08-14 ENCOUNTER — Other Ambulatory Visit: Payer: Self-pay

## 2020-08-14 ENCOUNTER — Ambulatory Visit
Admission: RE | Admit: 2020-08-14 | Discharge: 2020-08-14 | Disposition: A | Discharge status: Home / Self Care | Payer: Medicare PPO | Source: Ambulatory Visit | Attending: Internal Medicine | Admitting: Internal Medicine

## 2020-08-14 DIAGNOSIS — Z1231 Encounter for screening mammogram for malignant neoplasm of breast: Secondary | ICD-10-CM | POA: Diagnosis not present

## 2020-09-12 ENCOUNTER — Other Ambulatory Visit: Payer: Self-pay | Admitting: Internal Medicine

## 2020-10-01 DIAGNOSIS — Z13820 Encounter for screening for osteoporosis: Secondary | ICD-10-CM | POA: Diagnosis not present

## 2020-10-01 DIAGNOSIS — Z01419 Encounter for gynecological examination (general) (routine) without abnormal findings: Secondary | ICD-10-CM | POA: Diagnosis not present

## 2020-10-01 DIAGNOSIS — Z1239 Encounter for other screening for malignant neoplasm of breast: Secondary | ICD-10-CM | POA: Diagnosis not present

## 2020-10-01 DIAGNOSIS — Z6834 Body mass index (BMI) 34.0-34.9, adult: Secondary | ICD-10-CM | POA: Diagnosis not present

## 2020-10-01 DIAGNOSIS — Z1211 Encounter for screening for malignant neoplasm of colon: Secondary | ICD-10-CM | POA: Diagnosis not present

## 2020-10-06 ENCOUNTER — Other Ambulatory Visit: Payer: Self-pay | Admitting: Internal Medicine

## 2020-10-10 LAB — HM DEXA SCAN

## 2020-10-15 ENCOUNTER — Encounter: Payer: Self-pay | Admitting: Internal Medicine

## 2020-10-15 ENCOUNTER — Other Ambulatory Visit: Payer: Self-pay | Admitting: Internal Medicine

## 2020-11-04 ENCOUNTER — Other Ambulatory Visit: Payer: Self-pay | Admitting: Internal Medicine

## 2020-11-18 ENCOUNTER — Other Ambulatory Visit: Payer: Self-pay

## 2020-11-18 ENCOUNTER — Other Ambulatory Visit: Payer: Medicare PPO | Admitting: Internal Medicine

## 2020-11-18 DIAGNOSIS — C9000 Multiple myeloma not having achieved remission: Secondary | ICD-10-CM

## 2020-11-18 DIAGNOSIS — D890 Polyclonal hypergammaglobulinemia: Secondary | ICD-10-CM | POA: Diagnosis not present

## 2020-11-18 DIAGNOSIS — E1169 Type 2 diabetes mellitus with other specified complication: Secondary | ICD-10-CM | POA: Diagnosis not present

## 2020-11-18 DIAGNOSIS — E049 Nontoxic goiter, unspecified: Secondary | ICD-10-CM | POA: Diagnosis not present

## 2020-11-18 DIAGNOSIS — E1165 Type 2 diabetes mellitus with hyperglycemia: Secondary | ICD-10-CM | POA: Diagnosis not present

## 2020-11-18 DIAGNOSIS — E785 Hyperlipidemia, unspecified: Secondary | ICD-10-CM | POA: Diagnosis not present

## 2020-11-19 ENCOUNTER — Ambulatory Visit: Payer: Medicare PPO | Admitting: Internal Medicine

## 2020-11-21 LAB — IMMUNOFIXATION ELECTROPHORESIS
IgG (Immunoglobin G), Serum: 1332 mg/dL (ref 600–1540)
IgM, Serum: 488 mg/dL — ABNORMAL HIGH (ref 50–300)
Immunoglobulin A: 726 mg/dL — ABNORMAL HIGH (ref 70–320)

## 2020-11-21 LAB — HEPATIC FUNCTION PANEL
AG Ratio: 1.1 (calc) (ref 1.0–2.5)
ALT: 27 U/L (ref 6–29)
AST: 25 U/L (ref 10–35)
Albumin: 4.1 g/dL (ref 3.6–5.1)
Alkaline phosphatase (APISO): 55 U/L (ref 37–153)
Bilirubin, Direct: 0.1 mg/dL (ref 0.0–0.2)
Globulin: 3.6 g/dL (calc) (ref 1.9–3.7)
Indirect Bilirubin: 0.2 mg/dL (calc) (ref 0.2–1.2)
Total Bilirubin: 0.3 mg/dL (ref 0.2–1.2)
Total Protein: 7.7 g/dL (ref 6.1–8.1)

## 2020-11-21 LAB — LIPID PANEL
Cholesterol: 163 mg/dL (ref <200)
HDL: 58 mg/dL (ref >=50)
LDL Cholesterol (Calc): 84 mg/dL (calc)
Non-HDL Cholesterol (Calc): 105 mg/dL (calc) (ref <130)
Total CHOL/HDL Ratio: 2.8 (calc) (ref <5.0)
Triglycerides: 115 mg/dL (ref <150)

## 2020-11-21 LAB — ANA: Anti Nuclear Antibody (ANA): NEGATIVE

## 2020-11-21 LAB — HEMOGLOBIN A1C
Hgb A1c MFr Bld: 7.9 % of total Hgb — ABNORMAL HIGH (ref <5.7)
Mean Plasma Glucose: 180 mg/dL
eAG (mmol/L): 10 mmol/L

## 2020-11-21 LAB — SEDIMENTATION RATE: Sed Rate: 89 mm/h — ABNORMAL HIGH (ref 0–30)

## 2020-11-21 LAB — CYCLIC CITRUL PEPTIDE ANTIBODY, IGG: Cyclic Citrullin Peptide Ab: <16 UNITS

## 2020-12-05 ENCOUNTER — Other Ambulatory Visit: Payer: Self-pay

## 2020-12-05 ENCOUNTER — Ambulatory Visit: Payer: Medicare PPO | Admitting: Internal Medicine

## 2020-12-05 ENCOUNTER — Encounter: Payer: Self-pay | Admitting: Internal Medicine

## 2020-12-05 VITALS — BP 120/70 | HR 62 | Ht 62.5 in | Wt 199.0 lb

## 2020-12-05 DIAGNOSIS — E8881 Metabolic syndrome: Secondary | ICD-10-CM

## 2020-12-05 DIAGNOSIS — E785 Hyperlipidemia, unspecified: Secondary | ICD-10-CM

## 2020-12-05 DIAGNOSIS — E1169 Type 2 diabetes mellitus with other specified complication: Secondary | ICD-10-CM

## 2020-12-05 DIAGNOSIS — D890 Polyclonal hypergammaglobulinemia: Secondary | ICD-10-CM

## 2020-12-05 DIAGNOSIS — K862 Cyst of pancreas: Secondary | ICD-10-CM | POA: Diagnosis not present

## 2020-12-05 DIAGNOSIS — Z6835 Body mass index (BMI) 35.0-35.9, adult: Secondary | ICD-10-CM | POA: Diagnosis not present

## 2020-12-05 DIAGNOSIS — R002 Palpitations: Secondary | ICD-10-CM

## 2020-12-05 DIAGNOSIS — Z8739 Personal history of other diseases of the musculoskeletal system and connective tissue: Secondary | ICD-10-CM | POA: Diagnosis not present

## 2020-12-05 DIAGNOSIS — I10 Essential (primary) hypertension: Secondary | ICD-10-CM

## 2020-12-07 MED ORDER — CLONIDINE HCL 0.1 MG PO TABS: 0.1000 mg | ORAL_TABLET | Freq: Two times a day (BID) | ORAL | 3 refills | Status: DC

## 2020-12-07 NOTE — Progress Notes (Signed)
Subjective:    Patient ID: Nancy Wagner, female    DOB: 1950-12-28, 70 y.o.   MRN: [id number]  HPI 70 year old Black Female seen here today for 6 month recheck.  In January 2021, patient had surgery to remove tumor causing elevated testosterone.  This was a robotic assisted salpingo-oophorectomy by Dr. 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  Surgery was complicated by a large abdominal wall hematoma.  Her testosterone normalized.  She has a longstanding history of hypertension which is currently well controlled on multiple medications.  She had normal thyroid ultrasound in July 2021.  I thought at that time her thyroid might be enlarged.  She has a history of type 2 diabetes mellitus treated with Glucotrol XL and Janumet.  Is on Zocor 20 mg daily for hyperlipidemia.  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  Has been asymptomatic.  Had pneumonia in nineteen seventy-seven, throat surgery by Dr. Ernesto Rutherford in nineteen ninety-two with two C-sections in nineteen 43 in 15.  Social history: She is married.  Husband is Brewing technologist at [employer].  Patient used to go the gym but has not been during the pandemic.  Does not smoke or consume alcohol.  She is a retired Animal nutritionist.  In twenty twelve she had left knee injected with steroids for osteoarthritis.  Family history: Father and mother in their late 78s in fairly good health.  Four brothers.  One brother died of complications of cancer.  Four sisters-three of whom are in good health but one sister with history of MI and other health issues.    Review of Systems new complaint at end of visit today is palpitations which are sporadic and are new.  They self terminate and did not last long.  No chest pain.      Objective:   Physical Exam Blood pressure 120/70, pulse sixty-two, pulse oximetry 98% weight 199 pounds BMI 35.82. Skin is warm and dry.  No cervical adenopathy.  Chest is clear.  Cardiac exam regular rate and rhythm.  No arrhythmia noted today.  With this visit we did check CCP which was less than sixteen.  Sed rate is elevated at 89 and 4 years ago was fourteen.  ANA is negative.  Immunofixation shows elevated IgA of 726, normal IgG, elevated IgM of 488.  CCP was less than sixteen  Her lipid panel is entirely normal.  Her diabetes could be under better control at 7.9% and previously was 7.3% in June.  However in September 2020 it was 9.1%.  Liver functions are normal.  She has a history of gout and right great toe last treated in March 2020.  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 Edison Nasuti.  She had an MRI done in twenty twenty.  Repeat study was recommended in 2 years.  I have placed an order for repeat MRI.    Assessment & Plan:  IgA gammopathy  IgM gammopathy  History of gout  Type 2 diabetes mellitus  Essential hypertension-stable  History of pancreatic cyst-MRI to be repeated  She has seen oncologist in 2019 for dysproteinemia.  Apparently has family history multiple myeloma.  Hematologist felt she did not have myeloma but perhaps an autoimmune condition.  She will be referred to Rheumatologist.  Apparently sister has history of rheumatoid arthritis.  Apparently mother had multiple myeloma.  Complaint of palpitations-refer to cardiologist

## 2020-12-07 NOTE — Patient Instructions (Signed)
The following referrals will be made:   MRI ordered to follow-up on pancreatic cyst with follow-up with Dr. Ardis Hughs  To see  rheumatologist regarding polyclonal gammopathy  To see cardiologist regarding palpitations

## 2020-12-20 ENCOUNTER — Other Ambulatory Visit: Payer: Self-pay

## 2020-12-20 ENCOUNTER — Encounter: Payer: Self-pay | Admitting: Internal Medicine

## 2020-12-20 ENCOUNTER — Ambulatory Visit (INDEPENDENT_AMBULATORY_CARE_PROVIDER_SITE_OTHER): Payer: Medicare PPO | Admitting: Internal Medicine

## 2020-12-20 ENCOUNTER — Telehealth: Payer: Self-pay

## 2020-12-20 VITALS — HR 71 | Temp 99.4°F

## 2020-12-20 DIAGNOSIS — R059 Cough, unspecified: Secondary | ICD-10-CM | POA: Diagnosis not present

## 2020-12-20 DIAGNOSIS — J0100 Acute maxillary sinusitis, unspecified: Secondary | ICD-10-CM | POA: Diagnosis not present

## 2020-12-20 DIAGNOSIS — Z20822 Contact with and (suspected) exposure to covid-19: Secondary | ICD-10-CM

## 2020-12-20 DIAGNOSIS — H6691 Otitis media, unspecified, right ear: Secondary | ICD-10-CM | POA: Diagnosis not present

## 2020-12-20 DIAGNOSIS — J3489 Other specified disorders of nose and nasal sinuses: Secondary | ICD-10-CM

## 2020-12-20 MED ORDER — DOXYCYCLINE HYCLATE 100 MG PO TABS: 100.0000 mg | ORAL_TABLET | Freq: Two times a day (BID) | ORAL | 0 refills | Status: DC

## 2020-12-20 NOTE — Telephone Encounter (Signed)
Patient called is coughing, has a sinus headache temp is 98.2. This started on Monday and she would like to be tested for COVID bc she has an MRI scheduled next Friday.

## 2020-12-20 NOTE — Telephone Encounter (Signed)
Scheduled

## 2020-12-20 NOTE — Telephone Encounter (Signed)
Lobby visit 

## 2020-12-20 NOTE — Patient Instructions (Signed)
COVID-19 PCR nasal swab obtained and results pending.  Must quarantine at home until results are back.  Take doxycycline 100 mg twice daily for 10 days for maxillary sinusitis and right otitis media.  Rest and drink plenty of fluids.  Tylenol for fever if needed.

## 2020-12-20 NOTE — Progress Notes (Signed)
   Subjective:    Patient ID: Nancy Wagner, female    DOB: [birth year], 70 y.o.   MRN: [id number]  HPI 70 year old Female seen today in person regarding  sinus pressure, cough, headache and right ear pain.  Patient says husband was diagnosed with COVID-19 8 or 9 days ago.  Earlier this week she developed some sinus pressure.  She is concerned because she is scheduled to have an MRI in 1 week.  She denies fever or shaking chills.  Has a frontal type headache.  No documented fever.  Has had 2 COVID-19 immunizations.  Has not had booster yet.  Review of Systems denies nausea or vomiting, shaking chills, significant cough, dysgeusia     Objective:   Physical Exam  Respiratory rate is normal.  Pulse oximetry is normal. T 99.4 degrees, Pulse ox 99% on room air  Right TM is red and dull.  Her chest is clear to auscultation without rales or wheezing.  She is not coughing.       Assessment & Plan:  Close contact with COVID-19-exposed to her husband who has most recently been quarantining since he was diagnosed some 8 or 9 days ago.  Patient has had 2 vaccines and was scheduled to get booster later this month.  COVID-19 test obtained today via nasal swab.  She is to quarantine until results are back.  Respiratory infection symptoms and headache.  Patient feels that she may have a sinus infection.  We will place her on doxycycline 100 mg twice daily for 10 days. Rest and drink fluids.Tylenol if needed for headache or fever.

## 2020-12-21 ENCOUNTER — Telehealth: Payer: Self-pay | Admitting: Internal Medicine

## 2020-12-21 ENCOUNTER — Encounter: Payer: Self-pay | Admitting: Internal Medicine

## 2020-12-21 LAB — SARS-COV-2 RNA,(COVID-19) QUALITATIVE NAAT: SARS CoV2 RNA: DETECTED — CR

## 2020-12-21 NOTE — Telephone Encounter (Signed)
Telephoned patient with results of COVID-19 PCR test which is positive.  She says she is feeling better.  Thinks antibiotic helped her with nasal congestion/sinusitis.  Reminded patient she needs to quarantine for at least 5 days from onset of symptoms. MJB.MD

## 2020-12-23 ENCOUNTER — Telehealth: Payer: Self-pay | Admitting: Internal Medicine

## 2020-12-23 NOTE — Telephone Encounter (Signed)
Faxed Positive COVID -74 Lab results to [facility] [phone], phone [phone]

## 2020-12-26 ENCOUNTER — Other Ambulatory Visit: Payer: Medicare PPO

## 2020-12-26 ENCOUNTER — Other Ambulatory Visit: Payer: Self-pay | Admitting: Internal Medicine

## 2021-01-01 ENCOUNTER — Encounter: Payer: Self-pay | Admitting: Internal Medicine

## 2021-01-01 ENCOUNTER — Encounter: Payer: Self-pay | Admitting: *Deleted

## 2021-01-01 ENCOUNTER — Other Ambulatory Visit: Payer: Self-pay

## 2021-01-01 ENCOUNTER — Ambulatory Visit: Payer: Medicare PPO | Admitting: Internal Medicine

## 2021-01-01 ENCOUNTER — Ambulatory Visit (INDEPENDENT_AMBULATORY_CARE_PROVIDER_SITE_OTHER): Payer: Medicare PPO

## 2021-01-01 VITALS — BP 128/62 | HR 70 | Ht 63.0 in | Wt 196.0 lb

## 2021-01-01 DIAGNOSIS — E1169 Type 2 diabetes mellitus with other specified complication: Secondary | ICD-10-CM

## 2021-01-01 DIAGNOSIS — R002 Palpitations: Secondary | ICD-10-CM | POA: Insufficient documentation

## 2021-01-01 DIAGNOSIS — R011 Cardiac murmur, unspecified: Secondary | ICD-10-CM | POA: Insufficient documentation

## 2021-01-01 DIAGNOSIS — I152 Hypertension secondary to endocrine disorders: Secondary | ICD-10-CM

## 2021-01-01 DIAGNOSIS — R0600 Dyspnea, unspecified: Secondary | ICD-10-CM

## 2021-01-01 DIAGNOSIS — E1159 Type 2 diabetes mellitus with other circulatory complications: Secondary | ICD-10-CM

## 2021-01-01 DIAGNOSIS — R0609 Other forms of dyspnea: Secondary | ICD-10-CM

## 2021-01-01 DIAGNOSIS — E669 Obesity, unspecified: Secondary | ICD-10-CM | POA: Insufficient documentation

## 2021-01-01 LAB — PRO B NATRIURETIC PEPTIDE: NT-Pro BNP: 209 pg/mL (ref 0–301)

## 2021-01-01 NOTE — Progress Notes (Signed)
Cardiology Office Note:    Date:  01/01/2021   ID:  Nancy Wagner, DOB [birth year], MRN [id number]  PCP:  Elby Showers, MD  [facility] Cardiologist:  No primary care provider on file.  [facility] Electrophysiologist:  None   CC: Palpitations Consulted for the evaluation of palpitations at the behest of [place], Cresenciano Lick, MD  History of Present Illness:    Nancy Wagner is a 70 y.o. female with a hx of Obesity, DM with HTN, HLd who presents for evaluation 01/01/21.  Patient notes that she is feeling an occasional heart flutter.  Has had no chest pain, chest pressure, chest tightness, chest stinging.  Sometimes feels like she has to catch her breath.  This occurs spontaneously.  Discomfort occurs last at night, worsens with independent of, and improves with rest.  Patient exertion notable for going up and down steps at her house and feels no symptoms.  No shortness of breath at rest.  Notes PND and orthopnea.  No bendopnea, notes weight loss, and no leg swelling , or abdominal swelling.  No syncope or near syncope recently; but as a kid had syncope and was told that her heart wasn't pumping enough oxygen to her brain.  Outfew this as an adult. . Notes  palpitations or funny heart beats that are spontaneous, resolve without intervention.  In the last two weeks has had these at least twice.  Not associated with DOE or activity.     Patient reports NO prior cardiac testing including  echo,  stress test,  heart catheterizations,  cardioversion,  ablations.  No history of pre-eclampsia, early menarche, or prematurity.  No Fen-Phen.  Ambulatory BP not done.   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   GERD (gastroesophageal reflux disease)    diet control   Gout    12-08-2019  per pt last episode left foot 11/ 2020   Hirsutism    History of  obstructive sleep apnea    s/p  UPPP in 1985   Hypertension    followed by pcp  (12-08-2019  per pt had stress test many yrs ago, was told ok)   Lichen simplex chronicus    OA (osteoarthritis)    RA (rheumatoid arthritis) ([place])    12-08-2019  per pt was dx years ago, treated with high level prednisone for few weeks and some other treatment with her eyes and has been in remission since   Type 2 diabetes mellitus ([place])    followed by pcp ---  (12-08-2019 checks cbg's daily in am,  fasting-- 110 to 126)   Uterine fibroid    Wears glasses     Past Surgical History:  Procedure Laterality Date   [place]  last one 05-09-2018   DILATATION & CURRETTAGE/HYSTEROSCOPY WITH RESECTOCOPE N/A 04/07/2017   Procedure: [place];  Surgeon: Eldred Manges, MD;  Location: [place];  Service: Gynecology;  Laterality: N/A;   ROBOTIC ASSISTED SALPINGO OOPHERECTOMY Bilateral 12/14/2019   Procedure: XI ROBOTIC ASSISTED SALPINGO OOPHORECTOMY;  Surgeon: Delsa Bern, MD;  Location: [facility];  Service: Gynecology;  Laterality: Bilateral;  Bed held, but patient is expected to go home the same day. -ap   THROAT SURGERY  1985   minimal  uvulopalatopharyngoplasty for sleep apnea   TUBAL LIGATION Bilateral 1981    Current Medications: Current Meds  Medication Sig   allopurinol (ZYLOPRIM) 100 MG tablet TAKE 1 TABLET BY MOUTH EVERY DAY   amLODipine (NORVASC) 5 MG tablet TAKE 1 TABLET BY MOUTH EVERY DAY   atenolol (TENORMIN) 100 MG tablet TAKE 1 TABLET BY MOUTH EVERY DAY   Blood Glucose Monitoring Suppl (ACCU-CHEK GUIDE ME) w/Device KIT USE AS DIRECTED   cetirizine (ZYRTEC) 10 MG tablet Take 10 mg by mouth as needed.   Cholecalciferol (VITAMIN D3) 50 MCG (2000 UT) TABS Take 2 tablets by mouth 2 (two) times daily.   clobetasol cream (TEMOVATE) 7.26 % Apply 1 application  topically 2 (two) times daily as needed.   cloNIDine (CATAPRES) 0.1 MG tablet Take 1 tablet (0.1 mg total) by mouth 2 (two) times daily.   cyclobenzaprine (FLEXERIL) 10 MG tablet TAKE ONE HALF TABLET BY MOUTH AT BEDTIME FOR MUSCULOSKELETAL PAIN   doxycycline (VIBRA-TABS) 100 MG tablet Take 1 tablet (100 mg total) by mouth 2 (two) times daily.   esomeprazole (NEXIUM) 40 MG capsule TAKE 1 CAPSULE BY MOUTH EVERY DAY   fish oil-omega-3 fatty acids 1000 MG capsule Take 2 g by mouth at bedtime. Reported on 01/03/5596   folic acid (FOLVITE) 416 MCG tablet Take 400 mcg by mouth at bedtime.    glipiZIDE (GLUCOTROL XL) 5 MG 24 hr tablet TAKE 1 TABLET BY MOUTH EVERY DAY WITH BREAKFAST   HYDROcodone-acetaminophen (NORCO/VICODIN) 5-325 MG tablet Take 1 tablet by mouth at bedtime as needed.   JANUMET XR [phone] MG TB24 TAKE 1 TABLET BY MOUTH EVERY DAY WITH BREAKFAST   Multiple Vitamins-Minerals (MULTIVITAMIN WOMEN 50+) TABS Take 1 tablet by mouth at bedtime.   olmesartan-hydrochlorothiazide (BENICAR HCT) 40-25 MG tablet TAKE 1 TABLET BY MOUTH EVERY DAY   PROAIR HFA 108 (90 Base) MCG/ACT inhaler TAKE 2 PUFFS BY MOUTH EVERY 6 HOURS AS NEEDED FOR WHEEZE OR SHORTNESS OF BREATH   simvastatin (ZOCOR) 20 MG tablet TAKE 1 TABLET BY MOUTH EVERY DAY   vitamin B-12 (CYANOCOBALAMIN) 100 MCG tablet Take 100 mcg by mouth at bedtime.      Allergies:   Adhesive [tape], Codeine, and Other   Social History   Socioeconomic History   Marital status: Married    Spouse name: Not on file   Number of children: 2   Years of education: Not on file   Highest education level: Not on file  Occupational History   Not on file  Tobacco Use   Smoking status: Never Smoker   Smokeless tobacco: Never Used  Vaping Use   Vaping Use: Never used  Substance and Sexual Activity   Alcohol use: Yes    Comment: very rare   Drug use: Never   Sexual activity: Not on file  Other Topics Concern   Not on file   Social History Narrative   Not on file   Social Determinants of Health   Financial Resource Strain: Not on file  Food Insecurity: Not on file  Transportation Needs: Not on file  Physical Activity: Not on file  Stress: Not on file  Social Connections: Not on file     Family History: History of coronary artery disease notable for father. History of heart failure notable for no members. History of arrhythmia notable for no members. Sister has and aunt had heart problems of some sort.  ROS:   Please see the history of present illness.     All  other systems reviewed and are negative.  EKGs/Labs/Other Studies Reviewed:    The following studies were reviewed today:  EKG:   01/01/21: NSR rate 70 WNL 12/11/19: NSR 64 WNL Recent Labs: 05/23/2020: BUN 16; Creat 0.97; Hemoglobin 12.3; Platelets 219; Potassium 4.1; Sodium 140; TSH 1.63 11/18/2020: ALT 27  Recent Lipid Panel    Component Value Date/Time   CHOL 163 11/18/2020 0956   TRIG 115 11/18/2020 0956   HDL 58 11/18/2020 0956   CHOLHDL 2.8 11/18/2020 0956   VLDL 18 02/08/2017 1121   LDLCALC 84 11/18/2020 0956    Risk Assessment/Calculations:     N/A  Physical Exam:    VS:  BP 128/62    Pulse 70    Ht '5\' 3"'  (1.6 m)    Wt 196 lb (88.9 kg)    SpO2 96%    BMI 34.72 kg/m     Wt Readings from Last 3 Encounters:  01/01/21 196 lb (88.9 kg)  12/05/20 199 lb (90.3 kg)  06/28/20 194 lb (88 kg)    GEN:  Well nourished, well developed in no acute distress HEENT: Normal NECK: No JVD; No carotid bruits LYMPHATICS: No lymphadenopathy CARDIAC: RRR, II/VI holosystolic murmur, no rubs, gallops RESPIRATORY:  Clear to auscultation without rales, wheezing or rhonchi  ABDOMEN: Soft, non-tender, non-distended MUSCULOSKELETAL:  No edema; No deformity  SKIN: Warm and dry NEUROLOGIC:  Alert and oriented x 3 PSYCHIATRIC:  Normal affect   ASSESSMENT:    1. Palpitations   2. Obesity, diabetes, and hypertension syndrome (HCC)   3.  Heart murmur   4. DOE (dyspnea on exertion)    PLAN:    Palpitations - will obtain 14-day non live heart monitor (ZioPatch)  DOE  Heart Murmur - will get BNP - Would recommend an echocardiogram to assess LVEF and exclude WMA.  - patient will get information from her sister on what disease she has  Obesity/DM/HTN - ambulatory blood pressure not done, will start ambulatory BP monitoring; gave education on how to perform ambulatory blood pressure monitoring including the frequency and technique; goal ambulatory blood pressure < 135/85 on average - continue home medications  - discussed diet (DASH/low sodium), and exercise/weight loss interventions   Will address HLD at next visit  3 monthes follow up unless new symptoms or abnormal test results warranting change in plan  Would be reasonable for  APP Follow up    Medication Adjustments/Labs and Tests Ordered: Current medicines are reviewed at length with the patient today.  Concerns regarding medicines are outlined above.  Orders Placed This Encounter  Procedures   Pro b natriuretic peptide (BNP)   LONG TERM MONITOR (3-14 DAYS)   EKG 12-Lead   ECHOCARDIOGRAM COMPLETE   No orders of the defined types were placed in this encounter.   Patient Instructions  Medication Instructions:  Your physician recommends that you continue on your current medications as directed. Please refer to the Current Medication list given to you today.  *If you need a refill on your cardiac medications before your next appointment, please call your pharmacy*   Lab Work: TODAY: BNP If you have labs (blood work) drawn today and your tests are completely normal, you will receive your results only by:  [place] (if you have MyChart) OR  A paper copy in the mail If you have any lab test that is abnormal or we need to change your treatment, we will call you to review the results.   Testing/Procedures: Your physician has requested  that  you have an echocardiogram. Echocardiography is a painless test that uses sound waves to create images of your heart. It provides your doctor with information about the size and shape of your heart and how well your hearts chambers and valves are working. This procedure takes approximately one hour. There are no restrictions for this procedure.     Follow-Up: At [facility], you and your health needs are our priority.  As part of our continuing mission to provide you with exceptional heart care, we have created designated Provider Care Teams.  These Care Teams include your primary Cardiologist (physician) and Advanced Practice Providers (APPs -  Physician Assistants and Nurse Practitioners) who all work together to provide you with the care you need, when you need it.  We recommend signing up for the patient portal called "MyChart".  Sign up information is provided on this After Visit Summary.  MyChart is used to connect with patients for Virtual Visits (Telemedicine).  Patients are able to view lab/test results, encounter notes, upcoming appointments, etc.  Non-urgent messages can be sent to your provider as well.   To learn more about what you can do with MyChart, go to [email or web address].    Your next appointment:   3 month(s)  The format for your next appointment:   In Person  Provider:   You may see Gasper Sells, MD or one of the following Advanced Practice Providers on your designated Care Team:    Melina Copa, PA-C  Ermalinda Barrios, PA-C    Other Instructions *Check your BP 3 times per week  .ZIO XT- Long Term Monitor Instructions   Your physician has requested you wear your ZIO patch monitor__14__days.   This is a single patch monitor.  [employer] supplies one patch monitor per enrollment.  Additional stickers are not available.   Please do not apply patch if you will be having a Nuclear Stress Test, Echocardiogram, Cardiac CT, MRI, or Chest Xray during the time frame  you would be wearing the monitor. The patch cannot be worn during these tests.  You cannot remove and re-apply the ZIO XT patch monitor.   Your ZIO patch monitor will be sent USPS Priority mail from [facility] directly to your home address. The monitor may also be mailed to a PO BOX if home delivery is not available.   It may take 3-5 days to receive your monitor after you have been enrolled.   Once you have received you monitor, please review enclosed instructions.  Your monitor has already been registered assigning a specific monitor serial # to you.   Applying the monitor   Shave hair from upper left chest.   Hold abrader disc by orange tab.  Rub abrader in 40 strokes over left upper chest as indicated in your monitor instructions.   Clean area with 4 enclosed alcohol pads .  Use all pads to assure are is cleaned thoroughly.  Let dry.   Apply patch as indicated in monitor instructions.  Patch will be place under collarbone on left side of chest with arrow pointing upward.   Rub patch adhesive wings for 2 minutes.Remove white label marked "1".  Remove white label marked "2".  Rub patch adhesive wings for 2 additional minutes.   While looking in a mirror, press and release button in center of patch.  A small green light will flash 3-4 times .  This will be your only indicator the monitor has been turned on.     Do not  shower for the first 24 hours.  You may shower after the first 24 hours.   Press button if you feel a symptom. You will hear a small click.  Record Date, Time and Symptom in the Patient Log Book.   When you are ready to remove patch, follow instructions on last 2 pages of Patient Log Book.  Stick patch monitor onto last page of Patient Log Book.   Place Patient Log Book in [place] box.  Use locking tab on box and tape box closed securely.  The [place] and [employer] has [employer] on it.  Please place in mailbox as soon as possible.  Your physician should have your  test results approximately 7 days after the monitor has been mailed back to [facility].   Call [place] at [phone] if you have questions regarding your ZIO XT patch monitor.  Call them immediately if you see an orange light blinking on your monitor.   If your monitor falls off in less than 4 days contact our Monitor department at [phone].  If your monitor becomes loose or falls off after 4 days call [employer] at [phone] for suggestions on securing your monitor.       Signed, Werner Lean, MD  01/01/2021 10:43 AM    [place]

## 2021-01-01 NOTE — Progress Notes (Signed)
Patient ID: Nancy Wagner, female   DOB: 03/26/1951, 70 y.o.   MRN: [id number] Patient enrolled for [employer] to ship a 14 day ZIO XT long term holter monitor to her home.

## 2021-01-01 NOTE — Patient Instructions (Addendum)
Medication Instructions:  Your physician recommends that you continue on your current medications as directed. Please refer to the Current Medication list given to you today.  *If you need a refill on your cardiac medications before your next appointment, please call your pharmacy*   Lab Work: TODAY: BNP If you have labs (blood work) drawn today and your tests are completely normal, you will receive your results only by: Marland Kitchen MyChart Message (if you have MyChart) OR . A paper copy in the mail If you have any lab test that is abnormal or we need to change your treatment, we will call you to review the results.   Testing/Procedures: Your physician has requested that you have an echocardiogram. Echocardiography is a painless test that uses sound waves to create images of your heart. It provides your doctor with information about the size and shape of your heart and how well your heart's chambers and valves are working. This procedure takes approximately one hour. There are no restrictions for this procedure.     Follow-Up: At [facility], you and your health needs are our priority.  As part of our continuing mission to provide you with exceptional heart care, we have created designated Provider Care Teams.  These Care Teams include your primary Cardiologist (physician) and Advanced Practice Providers (APPs -  Physician Assistants and Nurse Practitioners) who all work together to provide you with the care you need, when you need it.  We recommend signing up for the patient portal called "MyChart".  Sign up information is provided on this After Visit Summary.  MyChart is used to connect with patients for Virtual Visits (Telemedicine).  Patients are able to view lab/test results, encounter notes, upcoming appointments, etc.  Non-urgent messages can be sent to your provider as well.   To learn more about what you can do with MyChart, go to [email or web address].    Your next appointment:   3  month(s)  The format for your next appointment:   In Person  Provider:   You may see Gasper Sells, MD or one of the following Advanced Practice Providers on your designated Care Team:    Melina Copa, PA-C  Ermalinda Barrios, PA-C    Other Instructions *Check your BP 3 times per week  .ZIO XT- Long Term Monitor Instructions   Your physician has requested you wear your ZIO patch monitor__14__days.   This is a single patch monitor.  [employer] supplies one patch monitor per enrollment.  Additional stickers are not available.   Please do not apply patch if you will be having a Nuclear Stress Test, Echocardiogram, Cardiac CT, MRI, or Chest Xray during the time frame you would be wearing the monitor. The patch cannot be worn during these tests.  You cannot remove and re-apply the ZIO XT patch monitor.   Your ZIO patch monitor will be sent USPS Priority mail from [facility] directly to your home address. The monitor may also be mailed to a PO BOX if home delivery is not available.   It may take 3-5 days to receive your monitor after you have been enrolled.   Once you have received you monitor, please review enclosed instructions.  Your monitor has already been registered assigning a specific monitor serial # to you.   Applying the monitor   Shave hair from upper left chest.   Hold abrader disc by orange tab.  Rub abrader in 40 strokes over left upper chest as indicated in your monitor instructions.   Clean area  with 4 enclosed alcohol pads .  Use all pads to assure are is cleaned thoroughly.  Let dry.   Apply patch as indicated in monitor instructions.  Patch will be place under collarbone on left side of chest with arrow pointing upward.   Rub patch adhesive wings for 2 minutes.Remove white label marked "1".  Remove white label marked "2".  Rub patch adhesive wings for 2 additional minutes.   While looking in a mirror, press and release button in center of patch.  A small green  light will flash 3-4 times .  This will be your only indicator the monitor has been turned on.     Do not shower for the first 24 hours.  You may shower after the first 24 hours.   Press button if you feel a symptom. You will hear a small click.  Record Date, Time and Symptom in the Patient Log Book.   When you are ready to remove patch, follow instructions on last 2 pages of Patient Log Book.  Stick patch monitor onto last page of Patient Log Book.   Place Patient Log Book in [place] box.  Use locking tab on box and tape box closed securely.  The [employer] has [employer] on it.  Please place in mailbox as soon as possible.  Your physician should have your test results approximately 7 days after the monitor has been mailed back to [facility].   Call [facility] at [phone] if you have questions regarding your ZIO XT patch monitor.  Call them immediately if you see an orange light blinking on your monitor.   If your monitor falls off in less than 4 days contact our Monitor department at [phone].  If your monitor becomes loose or falls off after 4 days call [employer] at [phone] for suggestions on securing your monitor.

## 2021-01-08 ENCOUNTER — Ambulatory Visit: Payer: Medicare PPO | Admitting: Nurse Practitioner

## 2021-01-09 DIAGNOSIS — R002 Palpitations: Secondary | ICD-10-CM

## 2021-01-10 DIAGNOSIS — E669 Obesity, unspecified: Secondary | ICD-10-CM | POA: Diagnosis not present

## 2021-01-10 DIAGNOSIS — D890 Polyclonal hypergammaglobulinemia: Secondary | ICD-10-CM | POA: Diagnosis not present

## 2021-01-10 DIAGNOSIS — R700 Elevated erythrocyte sedimentation rate: Secondary | ICD-10-CM | POA: Diagnosis not present

## 2021-01-10 DIAGNOSIS — R5383 Other fatigue: Secondary | ICD-10-CM | POA: Diagnosis not present

## 2021-01-10 DIAGNOSIS — M2550 Pain in unspecified joint: Secondary | ICD-10-CM | POA: Diagnosis not present

## 2021-01-10 DIAGNOSIS — M545 Low back pain, unspecified: Secondary | ICD-10-CM | POA: Diagnosis not present

## 2021-01-10 DIAGNOSIS — Z6834 Body mass index (BMI) 34.0-34.9, adult: Secondary | ICD-10-CM | POA: Diagnosis not present

## 2021-01-13 ENCOUNTER — Telehealth: Payer: Self-pay

## 2021-01-13 ENCOUNTER — Ambulatory Visit
Admission: RE | Admit: 2021-01-13 | Discharge: 2021-01-13 | Disposition: A | Discharge status: Home / Self Care | Payer: Medicare PPO | Source: Ambulatory Visit | Attending: Internal Medicine | Admitting: Internal Medicine

## 2021-01-13 ENCOUNTER — Other Ambulatory Visit: Payer: Self-pay

## 2021-01-13 ENCOUNTER — Telehealth: Payer: Self-pay | Admitting: Internal Medicine

## 2021-01-13 DIAGNOSIS — Z9049 Acquired absence of other specified parts of digestive tract: Secondary | ICD-10-CM | POA: Diagnosis not present

## 2021-01-13 DIAGNOSIS — K7689 Other specified diseases of liver: Secondary | ICD-10-CM | POA: Diagnosis not present

## 2021-01-13 DIAGNOSIS — N281 Cyst of kidney, acquired: Secondary | ICD-10-CM | POA: Diagnosis not present

## 2021-01-13 DIAGNOSIS — K862 Cyst of pancreas: Secondary | ICD-10-CM | POA: Diagnosis not present

## 2021-01-13 MED ORDER — GADOBENATE DIMEGLUMINE 529 MG/ML IV SOLN
18.0000 mL | Freq: Once | INTRAVENOUS | Status: AC | PRN
  Administered 2021-01-13: 18 mL via INTRAVENOUS

## 2021-01-13 NOTE — Telephone Encounter (Signed)
[facility] called in and stated that they had to take the Zio monitor off of this pt because he had to have a MRI for a Tumor found.  Pre [facility] at [place] this pt has a lot going on and was concerned about what to do now that it has been removed   Best number for pt [phone]

## 2021-01-13 NOTE — Telephone Encounter (Signed)
-----   Message from Timothy Lasso, RN sent at 01/11/2019 11:45 AM EST ----- pancreatic cysts; she needs a repeat MRI in 2 years

## 2021-01-14 NOTE — Telephone Encounter (Signed)
Patient had 14 day ZIO XT removed after 3 days for stat MRI.  [employer] contacted.  Replacement 14 day ZIO XT monitor will be shipped expedited.  Claim for first monitor charges will be held.

## 2021-01-14 NOTE — Telephone Encounter (Signed)
Left message on machine to call back  

## 2021-01-16 NOTE — Telephone Encounter (Signed)
Mailbox full will send letter to the pt

## 2021-01-21 ENCOUNTER — Encounter: Payer: Self-pay | Admitting: Gastroenterology

## 2021-01-21 ENCOUNTER — Ambulatory Visit: Payer: Medicare PPO | Admitting: Gastroenterology

## 2021-01-21 VITALS — BP 120/60 | HR 69 | Ht 63.0 in | Wt 195.0 lb

## 2021-01-21 DIAGNOSIS — K869 Disease of pancreas, unspecified: Secondary | ICD-10-CM | POA: Insufficient documentation

## 2021-01-21 NOTE — Progress Notes (Signed)
01/21/2021 Nancy Wagner [id number] 1951/10/19   HISTORY OF PRESENT ILLNESS: This is a pleasant 70 year old female who is a patient of Dr. Ardis Hughs.  She is here for follow-up of her recent MRI.  She has a 6 mm cystic lesion in the tail of the pancreas.  This was stable on MRI from February 2019 to February 2020 and then now also stable February 2022.  She does not have any complaints today including abdominal pain, nausea, vomiting, etc.   Past Medical History:  Diagnosis Date  . Asthma    controlled  (followed by pcp)  . Cervical stenosis (uterine cervix)   . 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 GERD (gastroesophageal reflux disease)    diet control  . Gout    12-08-2019  per pt last episode left foot 11/ 2020  . Hirsutism   . History of obstructive sleep apnea    s/p  UPPP in 1985  . Hypertension    followed by pcp  (12-08-2019  per pt had stress test many yrs ago, was told ok)  . Lichen simplex chronicus   . OA (osteoarthritis)   . RA (rheumatoid arthritis) ([place])    12-08-2019  per pt was dx years ago, treated with high level prednisone for few weeks and some other treatment with her eyes and has been in remission since  . Type 2 diabetes mellitus ([place])    followed by pcp ---  (12-08-2019 checks cbg's daily in am,  fasting-- 110 to 126)  . Uterine fibroid   . Wears glasses    Past Surgical History:  Procedure Laterality Date  . [place]  . CHOLECYSTECTOMY OPEN  1984  . COLONOSCOPY  last one 05-09-2018  . DILATATION & CURRETTAGE/HYSTEROSCOPY WITH RESECTOCOPE N/A 04/07/2017   Procedure: DILATATION & CURETTAGE/HYSTEROSCOPY WITH RESECTOCOPE;  Surgeon: Eldred Manges, MD;  Location: [facility];  Service: Gynecology;  Laterality: N/A;  . ROBOTIC ASSISTED SALPINGO OOPHERECTOMY Bilateral 12/14/2019   Procedure: XI ROBOTIC ASSISTED SALPINGO OOPHORECTOMY;  Surgeon: Delsa Bern, MD;  Location: [facility];  Service:  Gynecology;  Laterality: Bilateral;  Bed held, but patient is expected to go home the same day. -ap  . THROAT SURGERY  1985   minimal uvulopalatopharyngoplasty for sleep apnea  . TUBAL LIGATION Bilateral 1981    reports that she has never smoked. She has never used smokeless tobacco. She reports current alcohol use. She reports that she does not use drugs. family history includes Heart disease in her sister. Allergies  Allergen Reactions  . Adhesive [Tape] Rash    Band-Aid  . Codeine Hives, Itching and Rash  . Other Rash      Outpatient Encounter Medications as of 01/21/2021  Medication Sig  . allopurinol (ZYLOPRIM) 100 MG tablet TAKE 1 TABLET BY MOUTH EVERY DAY  . amLODipine (NORVASC) 5 MG tablet TAKE 1 TABLET BY MOUTH EVERY DAY  . atenolol (TENORMIN) 100 MG tablet TAKE 1 TABLET BY MOUTH EVERY DAY  . Blood Glucose Monitoring Suppl (ACCU-CHEK GUIDE ME) w/Device KIT USE AS DIRECTED  . cetirizine (ZYRTEC) 10 MG tablet Take 10 mg by mouth as needed.  . Cholecalciferol (VITAMIN D3) 50 MCG (2000 UT) TABS Take 2 tablets by mouth 2 (two) times daily.  . clobetasol cream (TEMOVATE) 7.86 % Apply 1 application topically 2 (two) times daily as needed.  . cloNIDine (CATAPRES) 0.1 MG tablet Take 1 tablet (0.1 mg total) by mouth 2 (  two) times daily.  . cyclobenzaprine (FLEXERIL) 10 MG tablet TAKE ONE HALF TABLET BY MOUTH AT BEDTIME FOR MUSCULOSKELETAL PAIN  . doxycycline (VIBRA-TABS) 100 MG tablet Take 1 tablet (100 mg total) by mouth 2 (two) times daily.  Marland Kitchen esomeprazole (NEXIUM) 40 MG capsule TAKE 1 CAPSULE BY MOUTH EVERY DAY  . fish oil-omega-3 fatty acids 1000 MG capsule Take 2 g by mouth at bedtime. Reported on 9/73/5329  . folic acid (FOLVITE) 924 MCG tablet Take 400 mcg by mouth at bedtime.   Marland Kitchen glipiZIDE (GLUCOTROL XL) 5 MG 24 hr tablet TAKE 1 TABLET BY MOUTH EVERY DAY WITH BREAKFAST  . HYDROcodone-acetaminophen (NORCO/VICODIN) 5-325 MG tablet Take 1 tablet by mouth at bedtime as needed.  Marland Kitchen  JANUMET XR [phone] MG TB24 TAKE 1 TABLET BY MOUTH EVERY DAY WITH BREAKFAST  . Multiple Vitamins-Minerals (MULTIVITAMIN WOMEN 50+) TABS Take 1 tablet by mouth at bedtime.  Marland Kitchen olmesartan-hydrochlorothiazide (BENICAR HCT) 40-25 MG tablet TAKE 1 TABLET BY MOUTH EVERY DAY  . PROAIR HFA 108 (90 Base) MCG/ACT inhaler TAKE 2 PUFFS BY MOUTH EVERY 6 HOURS AS NEEDED FOR WHEEZE OR SHORTNESS OF BREATH  . simvastatin (ZOCOR) 20 MG tablet TAKE 1 TABLET BY MOUTH EVERY DAY  . vitamin B-12 (CYANOCOBALAMIN) 100 MCG tablet Take 100 mcg by mouth at bedtime.   . [DISCONTINUED] indomethacin (INDOCIN) 50 MG capsule Take 1 capsule (50 mg total) by mouth 3 (three) times daily with meals.   No facility-administered encounter medications on file as of 01/21/2021.    REVIEW OF SYSTEMS  : All other systems reviewed and negative except where noted in the History of Present Illness.   PHYSICAL EXAM: BP 120/60   Pulse 69   Ht '5\' 3"'  (1.6 m)   Wt 195 lb (88.5 kg)   BMI 34.54 kg/m  General: Well developed AA female in no acute distress Head: Normocephalic and atraumatic Eyes:  Sclerae anicteric, conjunctiva pink. Ears: Normal auditory acuity Lungs: Clear throughout to auscultation; no W/R/R. Heart: Regular rate and rhythm; no M/R/G. Abdomen: Soft, non-distended.  BS present.  Non-tender. Musculoskeletal: Symmetrical with no gross deformities  Skin: No lesions on visible extremities Extremities: No edema  Neurological: Alert oriented x 4, grossly non-focal Psychological:  Alert and cooperative. Normal mood and affect  ASSESSMENT AND PLAN: *Pancreatic cyst: Was stable on MRI from 2019-2020 and then now from 2020-2022.  Recommendation was that if it was stable for 5 years with no significant change then surveillance would no longer be needed.  We will plan for another MRI of the abdomen in February 2024 and if stable no further imaging from that time.   CC:  Elby Showers, MD

## 2021-01-21 NOTE — Patient Instructions (Signed)
If you are age 72 or older, your body mass index should be between 23-30. Your Body mass index is 34.54 kg/m. If this is out of the aforementioned range listed, please consider follow up with your Primary Care Provider.  If you are age 60 or younger, your body mass index should be between 19-25. Your Body mass index is 34.54 kg/m. If this is out of the aformentioned range listed, please consider follow up with your Primary Care Provider.   Follow up MRI in 2 years.

## 2021-01-22 ENCOUNTER — Other Ambulatory Visit: Payer: Self-pay

## 2021-01-22 ENCOUNTER — Ambulatory Visit (HOSPITAL_COMMUNITY): Payer: Medicare PPO | Attending: Cardiology

## 2021-01-22 DIAGNOSIS — R0609 Other forms of dyspnea: Secondary | ICD-10-CM

## 2021-01-22 DIAGNOSIS — R011 Cardiac murmur, unspecified: Secondary | ICD-10-CM | POA: Diagnosis not present

## 2021-01-22 DIAGNOSIS — R0600 Dyspnea, unspecified: Secondary | ICD-10-CM | POA: Diagnosis not present

## 2021-01-22 LAB — ECHOCARDIOGRAM COMPLETE
Area-P 1/2: 2.39 cm2
S' Lateral: 2.7 cm

## 2021-01-22 NOTE — Progress Notes (Signed)
I agree with the above note, plan.  Incidental pancreatic cyst, following 2015 AGA guidelines or surveillance.

## 2021-01-29 DIAGNOSIS — R002 Palpitations: Secondary | ICD-10-CM | POA: Diagnosis not present

## 2021-02-12 DIAGNOSIS — R002 Palpitations: Secondary | ICD-10-CM | POA: Diagnosis not present

## 2021-02-25 NOTE — Progress Notes (Signed)
Letter printed will be sent to patient.  We have attempted to call her X3 her voicemail is full so we can't leave a message.  Tried alternate number also with no call back.

## 2021-03-06 ENCOUNTER — Telehealth: Payer: Self-pay | Admitting: Internal Medicine

## 2021-03-06 NOTE — Telephone Encounter (Signed)
Called patient notified her of results and MD recommendations.  Atenolol removed from medication list.  Told patient to do daily BP checks and call in and let office know if her BP increases; because she may need a new BP medication. Reminded her of next OV 04/16/21 at [place].  No further questions or concerns.

## 2021-03-06 NOTE — Telephone Encounter (Signed)
Nancy Wagner is calling due to receiving a letter that we have been trying to contact her to give her heart monitor results. She requested a callback to receive the results and states if she does not answer to please send the results by mail. Please advise.

## 2021-03-11 ENCOUNTER — Other Ambulatory Visit: Payer: Self-pay

## 2021-03-11 ENCOUNTER — Other Ambulatory Visit (HOSPITAL_BASED_OUTPATIENT_CLINIC_OR_DEPARTMENT_OTHER): Payer: Self-pay

## 2021-03-11 ENCOUNTER — Ambulatory Visit: Payer: Medicare PPO | Attending: Internal Medicine

## 2021-03-11 DIAGNOSIS — Z23 Encounter for immunization: Secondary | ICD-10-CM

## 2021-03-11 NOTE — Progress Notes (Signed)
   Covid-19 Vaccination Clinic  Name:  Nancy Wagner    MRN: [id number] DOB: 1951-06-01  03/11/2021  [name] was observed post Covid-19 immunization for 15 minutes without incident. She was provided with Vaccine Information Sheet and instruction to access the V-Safe system.   [name] was instructed to call 911 with any severe reactions post vaccine: Marland Kitchen Difficulty breathing  . Swelling of face and throat  . A fast heartbeat  . A bad rash all over body  . Dizziness and weakness   Immunizations Administered    Name Date Dose VIS Date Route   PFIZER Comrnaty(Gray TOP) Covid-19 Vaccine 03/11/2021  1:41 PM 0.3 mL 11/07/2020 Intramuscular   Manufacturer: [place]   Lot: [id number]   NDC: [phone]

## 2021-03-14 ENCOUNTER — Other Ambulatory Visit (HOSPITAL_BASED_OUTPATIENT_CLINIC_OR_DEPARTMENT_OTHER): Payer: Self-pay

## 2021-03-14 MED ORDER — COVID-19 MRNA VACCINE (PFIZER) 30 MCG/0.3ML IM SUSP
INTRAMUSCULAR | 0 refills | Status: DC
  Filled 2021-03-14: qty 0.3, 1d supply, fill #0

## 2021-03-20 ENCOUNTER — Other Ambulatory Visit: Payer: Self-pay | Admitting: Internal Medicine

## 2021-03-22 ENCOUNTER — Other Ambulatory Visit: Payer: Self-pay | Admitting: Internal Medicine

## 2021-03-23 ENCOUNTER — Other Ambulatory Visit: Payer: Self-pay | Admitting: Internal Medicine

## 2021-04-14 NOTE — Progress Notes (Signed)
Cardiology Office Note:    Date:  04/16/2021   ID:  Nancy Wagner, DOB [birth year], MRN [id number]  PCP:  Elby Showers, MD  [facility] Cardiologist:  Werner Lean, MD  [facility] Electrophysiologist:  None   CC: Palpitations f/u  History of Present Illness:    Nancy Wagner is a 70 y.o. female with a hx of Obesity, DM with HTN, HLd who presents for evaluation 01/01/21. In interim of this visit, patient had echo and ziopatch. Had vagotonic II HB; planned to stop atenolol but patient was unable to be reached X 3.  Got voicemail and stopped atenolol and immediately have sBP 190.  Did not call in but returned to atenolol.  Seen 04/16/21.  Patient notes that she is doing feel fine.  Since last visit notes that heart heart palpitations have improved. Relevant interval testing or therapy include ZioPatch and echocardiogram.  There are no interval hospital/ED visit.    No chest pain or pressure.  No SOB but notes some breathing difficulty outside that she attributes to allergies; no DOE and no PND/Orthopnea.  No weight gain or leg swelling.  No palpitations or syncope.  Ambulatory blood pressure 130/80 on atenolol.  Discussed a prior heart catheterization with prior non-obstructive disease per patient description.   Past Medical History:  Diagnosis Date  . Asthma    controlled  (followed by pcp)  . Cervical stenosis (uterine cervix)   . 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 GERD (gastroesophageal reflux disease)    diet control  . Gout    12-08-2019  per pt last episode left foot 11/ 2020  . Hirsutism   . History of obstructive sleep apnea    s/p  UPPP in 1985  . Hypertension    followed by pcp  (12-08-2019  per pt had stress test many yrs ago, was told ok)  . Lichen simplex chronicus   . OA (osteoarthritis)   . RA (rheumatoid arthritis) ([place])    12-08-2019  per pt was dx years ago, treated with high level prednisone for few weeks and some other  treatment with her eyes and has been in remission since  . Type 2 diabetes mellitus ([place])    followed by pcp ---  (12-08-2019 checks cbg's daily in am,  fasting-- 110 to 126)  . Uterine fibroid   . Wears glasses     Past Surgical History:  Procedure Laterality Date  . [place]  . CHOLECYSTECTOMY OPEN  1984  . COLONOSCOPY  last one 05-09-2018  . DILATATION & CURRETTAGE/HYSTEROSCOPY WITH RESECTOCOPE N/A 04/07/2017   Procedure: DILATATION & CURETTAGE/HYSTEROSCOPY WITH RESECTOCOPE;  Surgeon: Eldred Manges, MD;  Location: [place];  Service: Gynecology;  Laterality: N/A;  . ROBOTIC ASSISTED SALPINGO OOPHERECTOMY Bilateral 12/14/2019   Procedure: XI ROBOTIC ASSISTED SALPINGO OOPHORECTOMY;  Surgeon: Delsa Bern, MD;  Location: [facility];  Service: Gynecology;  Laterality: Bilateral;  Bed held, but patient is expected to go home the same day. -ap  . THROAT SURGERY  1985   minimal uvulopalatopharyngoplasty for sleep apnea  . TUBAL LIGATION Bilateral 1981    Current Medications: Current Meds  Medication Sig  . allopurinol (ZYLOPRIM) 100 MG tablet TAKE 1 TABLET BY MOUTH EVERY DAY  . amLODipine (NORVASC) 5 MG tablet TAKE 1 TABLET BY MOUTH EVERY DAY  . atenolol (TENORMIN) 100 MG tablet TAKE 1 TABLET BY MOUTH EVERY DAY  . Blood Glucose Monitoring Suppl (ACCU-CHEK  GUIDE ME) w/Device KIT USE AS DIRECTED  . cetirizine (ZYRTEC) 10 MG tablet Take 10 mg by mouth as needed.  . Cholecalciferol (VITAMIN D3) 50 MCG (2000 UT) TABS Take 1 tablet by mouth daily.  . clobetasol cream (TEMOVATE) 3.88 % Apply 1 application topically 2 (two) times daily as needed.  . cloNIDine (CATAPRES) 0.1 MG tablet Take 1 tablet (0.1 mg total) by mouth 2 (two) times daily.  . cyclobenzaprine (FLEXERIL) 10 MG tablet TAKE ONE HALF TABLET BY MOUTH AT BEDTIME FOR MUSCULOSKELETAL PAIN (Patient taking differently: as needed. TAKE ONE HALF TABLET BY MOUTH AT BEDTIME FOR MUSCULOSKELETAL PAIN)  .  esomeprazole (NEXIUM) 40 MG capsule TAKE 1 CAPSULE BY MOUTH EVERY DAY  . fish oil-omega-3 fatty acids 1000 MG capsule Take 2 g by mouth at bedtime. Reported on 07/27/33  . folic acid (FOLVITE) 917 MCG tablet Take 400 mcg by mouth at bedtime.   Marland Kitchen glipiZIDE (GLUCOTROL XL) 5 MG 24 hr tablet TAKE 1 TABLET BY MOUTH EVERY DAY WITH BREAKFAST  . HYDROcodone-acetaminophen (NORCO/VICODIN) 5-325 MG tablet Take 1 tablet by mouth at bedtime as needed.  Marland Kitchen JANUMET XR [phone] MG TB24 TAKE 1 TABLET BY MOUTH EVERY DAY WITH BREAKFAST  . Multiple Vitamins-Minerals (MULTIVITAMIN WOMEN 50+) TABS Take 1 tablet by mouth at bedtime.  Marland Kitchen olmesartan-hydrochlorothiazide (BENICAR HCT) 40-25 MG tablet TAKE 1 TABLET BY MOUTH EVERY DAY  . PROAIR HFA 108 (90 Base) MCG/ACT inhaler TAKE 2 PUFFS BY MOUTH EVERY 6 HOURS AS NEEDED FOR WHEEZE OR SHORTNESS OF BREATH  . simvastatin (ZOCOR) 20 MG tablet TAKE 1 TABLET BY MOUTH EVERY DAY     Allergies:   Adhesive [tape], Codeine, and Other   Social History   Socioeconomic History  . Marital status: Married    Spouse name: Not on file  . Number of children: 2  . Years of education: Not on file  . Highest education level: Not on file  Occupational History  . Not on file  Tobacco Use  . Smoking status: Never Smoker  . Smokeless tobacco: Never Used  Vaping Use  . Vaping Use: Never used  Substance and Sexual Activity  . Alcohol use: Yes    Comment: very rare  . Drug use: Never  . Sexual activity: Not on file  Other Topics Concern  . Not on file  Social History Narrative  . Not on file   Social Determinants of Health   Financial Resource Strain: Not on file  Food Insecurity: Not on file  Transportation Needs: Not on file  Physical Activity: Not on file  Stress: Not on file  Social Connections: Not on file     Family History: History of coronary artery disease notable for father. History of heart failure notable for no members. History of arrhythmia notable for no  members. Sister has and aunt had heart problems of some sort.  ROS:   Please see the history of present illness.     All other systems reviewed and are negative.  EKGs/Labs/Other Studies Reviewed:    The following studies were reviewed today:  EKG:   01/01/21: NSR rate 70 WNL 12/11/19: NSR 64 WNL  Cardiac Event Monitoring: Date: 02/12/21 Results:  Patient had a minimum heart rate of 49 bpm, maximum heart rate of 150 bpm, and average heart rate of 66 bpm through two devices.  Computer algorithm reads heart rates of 29 and 37 bpm; this is based on the dropped beat of a Mobitz Type I heart block.  Predominant underlying rhythm was sinus rhythm.  One run of non-sustained ventricular tachycardia occurred lasting 4 beats at longest with a max rate of 150 bpm at fastest.  Isolated PACs were rare (<1.0%), with rare couplets or triplets present.  Isolated PVCs were rare (<1.0%), with rare couplets present.  There is Mobitz Type I heart block and nocturnal Mobitz Type II heart block (01/13/21 4:28 AM).  No triggered and diary events.    Transthoracic Echocardiogram: Date: 01/22/21 Results: No evidence of significant aortic dilation when indexed for height 1. Left ventricular ejection fraction, by estimation, is 60 to 65%. The  left ventricle has normal function. The left ventricle has no regional  wall motion abnormalities. There is mild asymmetric left ventricular  hypertrophy of the basal-septal segment.  Left ventricular diastolic parameters are consistent with Grade I  diastolic dysfunction (impaired relaxation). Elevated left atrial  pressure. The E/e' is 23.  2. Right ventricular systolic function is normal. The right ventricular  size is normal. There is normal pulmonary artery systolic pressure.  3. The mitral valve is normal in structure. Trivial mitral valve  regurgitation.  4. The aortic valve is tricuspid. There is mild calcification of the  aortic valve. There is  mild thickening of the aortic valve. Aortic valve  regurgitation is not visualized. Mild aortic valve sclerosis is present,  with no evidence of aortic valve  stenosis.  5. Aortic dilatation noted. 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  6. The inferior vena cava is normal in size with greater than 50%  respiratory variability, suggesting right atrial pressure of 3 mmHg.    Recent Labs: 05/23/2020: BUN 16; Creat 0.97; Hemoglobin 12.3; Platelets 219; Potassium 4.1; Sodium 140; TSH 1.63 11/18/2020: ALT 27 01/01/2021: NT-Pro BNP 209  Recent Lipid Panel    Component Value Date/Time   CHOL 163 11/18/2020 0956   TRIG 115 11/18/2020 0956   HDL 58 11/18/2020 0956   CHOLHDL 2.8 11/18/2020 0956   VLDL 18 02/08/2017 1121   LDLCALC 84 11/18/2020 0956    Risk Assessment/Calculations:     N/A  Physical Exam:    VS:  BP 140/68   Pulse 67   Ht '5\' 3"'  (1.6 m)   Wt 89.4 kg   SpO2 97%   BMI 34.90 kg/m     Wt Readings from Last 3 Encounters:  04/16/21 89.4 kg  01/21/21 88.5 kg  01/01/21 88.9 kg    GEN:  Well nourished, well developed in no acute distress HEENT: Normal NECK: No JVD; No carotid bruits LYMPHATICS: No lymphadenopathy CARDIAC: RRR, II/VI ejection murmur, no rubs, gallops RESPIRATORY:  Clear to auscultation without rales, wheezing or rhonchi  ABDOMEN: Soft, non-tender, non-distended MUSCULOSKELETAL:  No edema; No deformity  SKIN: Warm and dry NEUROLOGIC:  Alert and oriented x 3 PSYCHIATRIC:  Normal affect   ASSESSMENT:    1. Obesity, diabetes, and hypertension syndrome ([place])   2. Mixed hyperlipidemia    PLAN:    Obesity/DM/HTN Mobitz Type I and Type II nocturnal HB- asymptomatic HLD - unable to view distant [facility] records for confirmation; if new sx occur will get Lexiscan - ambulatory blood pressure at goal on atenolol and current regimen, will continue ambulatory BP monitoring - continue home medications- discussed red flag symptoms  of heart block; if needed we can increase her norvasc to 10 mg PO Daily and wean off atenolol - continue norvasc 5 mg PO daily and olmesartan-HCTZ - continue current statin  6 months follow up unless new symptoms or abnormal test results warranting change in plan  Would be reasonable for  APP Follow up   Medication Adjustments/Labs and Tests Ordered: Current medicines are reviewed at length with the patient today.  Concerns regarding medicines are outlined above.  No orders of the defined types were placed in this encounter.  No orders of the defined types were placed in this encounter.   Patient Instructions  Medication Instructions:  Your physician recommends that you continue on your current medications as directed. Please refer to the Current Medication list given to you today.  *If you need a refill on your cardiac medications before your next appointment, please call your pharmacy*   Lab Work: NONE If you have labs (blood work) drawn today and your tests are completely normal, you will receive your results only by: Marland Kitchen MyChart Message (if you have MyChart) OR . A paper copy in the mail If you have any lab test that is abnormal or we need to change your treatment, we will call you to review the results.   Testing/Procedures: NONE   Follow-Up: At [facility], you and your health needs are our priority.  As part of our continuing mission to provide you with exceptional heart care, we have created designated Provider Care Teams.  These Care Teams include your primary Cardiologist (physician) and Advanced Practice Providers (APPs -  Physician Assistants and Nurse Practitioners) who all work together to provide you with the care you need, when you need it.  We recommend signing up for the patient portal called "MyChart".  Sign up information is provided on this After Visit Summary.  MyChart is used to connect with patients for Virtual Visits (Telemedicine).  Patients are  able to view lab/test results, encounter notes, upcoming appointments, etc.  Non-urgent messages can be sent to your provider as well.   To learn more about what you can do with MyChart, go to [email or web address].    Your next appointment:   6 month(s)  The format for your next appointment:   In Person  Provider:   You may see Werner Lean, MD or one of the following Advanced Practice Providers on your designated Care Team:    Melina Copa, PA-C  Ermalinda Barrios, PA-C         Signed, Werner Lean, MD  04/16/2021 10:23 AM    [place]

## 2021-04-16 ENCOUNTER — Other Ambulatory Visit: Payer: Self-pay

## 2021-04-16 ENCOUNTER — Encounter: Payer: Self-pay | Admitting: Internal Medicine

## 2021-04-16 ENCOUNTER — Ambulatory Visit: Payer: Medicare PPO | Admitting: Internal Medicine

## 2021-04-16 VITALS — BP 140/68 | HR 67 | Ht 63.0 in | Wt 197.0 lb

## 2021-04-16 DIAGNOSIS — E1159 Type 2 diabetes mellitus with other circulatory complications: Secondary | ICD-10-CM | POA: Diagnosis not present

## 2021-04-16 DIAGNOSIS — I152 Hypertension secondary to endocrine disorders: Secondary | ICD-10-CM | POA: Diagnosis not present

## 2021-04-16 DIAGNOSIS — E782 Mixed hyperlipidemia: Secondary | ICD-10-CM | POA: Diagnosis not present

## 2021-04-16 DIAGNOSIS — E669 Obesity, unspecified: Secondary | ICD-10-CM | POA: Diagnosis not present

## 2021-04-16 DIAGNOSIS — E1169 Type 2 diabetes mellitus with other specified complication: Secondary | ICD-10-CM

## 2021-04-16 NOTE — Patient Instructions (Signed)
Medication Instructions:  Your physician recommends that you continue on your current medications as directed. Please refer to the Current Medication list given to you today.  *If you need a refill on your cardiac medications before your next appointment, please call your pharmacy*   Lab Work: NONE If you have labs (blood work) drawn today and your tests are completely normal, you will receive your results only by: . MyChart Message (if you have MyChart) OR . A paper copy in the mail If you have any lab test that is abnormal or we need to change your treatment, we will call you to review the results.   Testing/Procedures: NONE   Follow-Up: At [facility], you and your health needs are our priority.  As part of our continuing mission to provide you with exceptional heart care, we have created designated Provider Care Teams.  These Care Teams include your primary Cardiologist (physician) and Advanced Practice Providers (APPs -  Physician Assistants and Nurse Practitioners) who all work together to provide you with the care you need, when you need it.  We recommend signing up for the patient portal called "MyChart".  Sign up information is provided on this After Visit Summary.  MyChart is used to connect with patients for Virtual Visits (Telemedicine).  Patients are able to view lab/test results, encounter notes, upcoming appointments, etc.  Non-urgent messages can be sent to your provider as well.   To learn more about what you can do with MyChart, go to [email or web address].    Your next appointment:   6 month(s)  The format for your next appointment:   In Person  Provider:   You may see Mahesh A Chandrasekhar, MD or one of the following Advanced Practice Providers on your designated Care Team:    Dayna Dunn, PA-C  Michele Lenze, PA-C       

## 2021-05-05 ENCOUNTER — Telehealth: Payer: Self-pay | Admitting: Internal Medicine

## 2021-05-05 NOTE — Telephone Encounter (Signed)
Nancy Wagner [phone]  [name] called wanting to get a referral for a hand doctor, she stated she has tried the braces and it has not helped. Her hands seem to be getting worse instead of better. Should I schedule office visit next week to discuss?

## 2021-05-16 ENCOUNTER — Other Ambulatory Visit: Payer: Self-pay | Admitting: Internal Medicine

## 2021-05-20 DIAGNOSIS — G5601 Carpal tunnel syndrome, right upper limb: Secondary | ICD-10-CM | POA: Diagnosis not present

## 2021-05-20 DIAGNOSIS — M542 Cervicalgia: Secondary | ICD-10-CM | POA: Diagnosis not present

## 2021-05-21 ENCOUNTER — Encounter: Payer: Self-pay | Admitting: Internal Medicine

## 2021-05-21 DIAGNOSIS — H524 Presbyopia: Secondary | ICD-10-CM | POA: Diagnosis not present

## 2021-05-21 DIAGNOSIS — H2513 Age-related nuclear cataract, bilateral: Secondary | ICD-10-CM | POA: Diagnosis not present

## 2021-05-21 DIAGNOSIS — H43813 Vitreous degeneration, bilateral: Secondary | ICD-10-CM | POA: Diagnosis not present

## 2021-05-21 DIAGNOSIS — E119 Type 2 diabetes mellitus without complications: Secondary | ICD-10-CM | POA: Diagnosis not present

## 2021-05-21 LAB — HM DIABETES EYE EXAM

## 2021-05-22 ENCOUNTER — Encounter: Payer: Self-pay | Admitting: Internal Medicine

## 2021-05-22 LAB — HM MAMMOGRAPHY

## 2021-05-29 ENCOUNTER — Other Ambulatory Visit: Payer: Self-pay

## 2021-05-29 ENCOUNTER — Other Ambulatory Visit: Payer: Medicare PPO | Admitting: Internal Medicine

## 2021-05-29 DIAGNOSIS — E785 Hyperlipidemia, unspecified: Secondary | ICD-10-CM

## 2021-05-29 DIAGNOSIS — Z6835 Body mass index (BMI) 35.0-35.9, adult: Secondary | ICD-10-CM

## 2021-05-29 DIAGNOSIS — M1712 Unilateral primary osteoarthritis, left knee: Secondary | ICD-10-CM

## 2021-05-29 DIAGNOSIS — E8881 Metabolic syndrome: Secondary | ICD-10-CM | POA: Diagnosis not present

## 2021-05-29 DIAGNOSIS — Z0000 Encounter for general adult medical examination without abnormal findings: Secondary | ICD-10-CM

## 2021-05-29 DIAGNOSIS — E049 Nontoxic goiter, unspecified: Secondary | ICD-10-CM

## 2021-05-29 DIAGNOSIS — I10 Essential (primary) hypertension: Secondary | ICD-10-CM | POA: Diagnosis not present

## 2021-05-29 DIAGNOSIS — E1169 Type 2 diabetes mellitus with other specified complication: Secondary | ICD-10-CM | POA: Diagnosis not present

## 2021-05-29 DIAGNOSIS — G5601 Carpal tunnel syndrome, right upper limb: Secondary | ICD-10-CM | POA: Diagnosis not present

## 2021-05-30 LAB — CBC WITH DIFFERENTIAL/PLATELET
Absolute Monocytes: 495 cells/uL (ref 200–950)
Basophils Absolute: 40 cells/uL (ref 0–200)
Basophils Relative: 0.6 %
Eosinophils Absolute: 132 cells/uL (ref 15–500)
Eosinophils Relative: 2 %
HCT: 37.5 % (ref 35.0–45.0)
Hemoglobin: 12.1 g/dL (ref 11.7–15.5)
Lymphs Abs: 3043 cells/uL (ref 850–3900)
MCH: 30 pg (ref 27.0–33.0)
MCHC: 32.3 g/dL (ref 32.0–36.0)
MCV: 92.8 fL (ref 80.0–100.0)
MPV: 11.8 fL (ref 7.5–12.5)
Monocytes Relative: 7.5 %
Neutro Abs: 2891 cells/uL (ref 1500–7800)
Neutrophils Relative %: 43.8 %
Platelets: 236 10*3/uL (ref 140–400)
RBC: 4.04 10*6/uL (ref 3.80–5.10)
RDW: 13.3 % (ref 11.0–15.0)
Total Lymphocyte: 46.1 %
WBC: 6.6 10*3/uL (ref 3.8–10.8)

## 2021-05-30 LAB — COMPLETE METABOLIC PANEL WITH GFR
AG Ratio: 1.2 (calc) (ref 1.0–2.5)
ALT: 28 U/L (ref 6–29)
AST: 32 U/L (ref 10–35)
Albumin: 4.2 g/dL (ref 3.6–5.1)
Alkaline phosphatase (APISO): 52 U/L (ref 37–153)
BUN/Creatinine Ratio: 14 (calc) (ref 6–22)
BUN: 15 mg/dL (ref 7–25)
CO2: 30 mmol/L (ref 20–32)
Calcium: 9.8 mg/dL (ref 8.6–10.4)
Chloride: 99 mmol/L (ref 98–110)
Creat: 1.11 mg/dL — ABNORMAL HIGH (ref 0.60–0.93)
GFR, Est African American: 58 mL/min/{1.73_m2} — ABNORMAL LOW (ref >=60)
GFR, Est Non African American: 50 mL/min/{1.73_m2} — ABNORMAL LOW (ref >=60)
Globulin: 3.6 g/dL (calc) (ref 1.9–3.7)
Glucose, Bld: 134 mg/dL — ABNORMAL HIGH (ref 65–99)
Potassium: 3.9 mmol/L (ref 3.5–5.3)
Sodium: 138 mmol/L (ref 135–146)
Total Bilirubin: 0.3 mg/dL (ref 0.2–1.2)
Total Protein: 7.8 g/dL (ref 6.1–8.1)

## 2021-05-30 LAB — HEMOGLOBIN A1C
Hgb A1c MFr Bld: 7.3 % of total Hgb — ABNORMAL HIGH (ref <5.7)
Mean Plasma Glucose: 163 mg/dL
eAG (mmol/L): 9 mmol/L

## 2021-05-30 LAB — LIPID PANEL
Cholesterol: 172 mg/dL (ref <200)
HDL: 55 mg/dL (ref >=50)
LDL Cholesterol (Calc): 91 mg/dL (calc)
Non-HDL Cholesterol (Calc): 117 mg/dL (calc) (ref <130)
Total CHOL/HDL Ratio: 3.1 (calc) (ref <5.0)
Triglycerides: 154 mg/dL — ABNORMAL HIGH (ref <150)

## 2021-05-30 LAB — TSH: TSH: 2.48 mIU/L (ref 0.40–4.50)

## 2021-06-09 ENCOUNTER — Ambulatory Visit (INDEPENDENT_AMBULATORY_CARE_PROVIDER_SITE_OTHER): Payer: Medicare PPO | Admitting: Internal Medicine

## 2021-06-09 ENCOUNTER — Encounter: Payer: Self-pay | Admitting: Internal Medicine

## 2021-06-09 ENCOUNTER — Other Ambulatory Visit: Payer: Self-pay

## 2021-06-09 VITALS — BP 120/80 | HR 60 | Ht 62.5 in | Wt 196.0 lb

## 2021-06-09 DIAGNOSIS — Z0000 Encounter for general adult medical examination without abnormal findings: Secondary | ICD-10-CM | POA: Diagnosis not present

## 2021-06-09 DIAGNOSIS — E8881 Metabolic syndrome: Secondary | ICD-10-CM | POA: Diagnosis not present

## 2021-06-09 DIAGNOSIS — J301 Allergic rhinitis due to pollen: Secondary | ICD-10-CM | POA: Diagnosis not present

## 2021-06-09 DIAGNOSIS — M1712 Unilateral primary osteoarthritis, left knee: Secondary | ICD-10-CM

## 2021-06-09 DIAGNOSIS — Z8739 Personal history of other diseases of the musculoskeletal system and connective tissue: Secondary | ICD-10-CM

## 2021-06-09 DIAGNOSIS — D890 Polyclonal hypergammaglobulinemia: Secondary | ICD-10-CM

## 2021-06-09 DIAGNOSIS — E1169 Type 2 diabetes mellitus with other specified complication: Secondary | ICD-10-CM

## 2021-06-09 DIAGNOSIS — Z6835 Body mass index (BMI) 35.0-35.9, adult: Secondary | ICD-10-CM | POA: Diagnosis not present

## 2021-06-09 DIAGNOSIS — E785 Hyperlipidemia, unspecified: Secondary | ICD-10-CM

## 2021-06-09 DIAGNOSIS — I10 Essential (primary) hypertension: Secondary | ICD-10-CM | POA: Diagnosis not present

## 2021-06-09 LAB — POCT URINALYSIS DIPSTICK
Appearance: NEGATIVE
Bilirubin, UA: NEGATIVE
Blood, UA: NEGATIVE
Glucose, UA: NEGATIVE
Ketones, UA: NEGATIVE
Leukocytes, UA: NEGATIVE
Nitrite, UA: NEGATIVE
Odor: NEGATIVE
Protein, UA: NEGATIVE
Spec Grav, UA: 1.01 (ref 1.010–1.025)
Urobilinogen, UA: 0.2 E.U./dL
pH, UA: 6 (ref 5.0–8.0)

## 2021-06-09 NOTE — Patient Instructions (Addendum)
It was a pleasure to see you today. RTC in 6 months. Referral to Endocrinology for diabetes management.

## 2021-06-09 NOTE — Progress Notes (Signed)
Subjective:    Patient ID: Nancy Wagner, female    DOB: 17-Sep-1951, 70 y.o.   MRN: [id number]  HPI 70 year old Female seen for health maintenance exam, Medicare wellness, and evaluation of medical issues.  Asking about injectable medication for diabetes.  Recent Hgb AIC is 7.3 % and was 7.9 in December 2021.Fasting glucose is 134. Does not want to go up on Glipizide.  Refer to Endocrinology for evaluation.  Lipids are stable with triglycerides slightly elevated at 154.  Had eye exam May 21, 2021 by Dr. Satira Sark.  Reviewed Covid vaccines. Tdap due next year.  Dr. Cletis Media is GYN physician.  Had robotic assisted BSO in 2021.  This was for hyperandrogenism.  No tumor was found  She had colonoscopy in 2019 with 10-year follow-up recommended.  History of hypertension and obesity.  Intolerant of codeine as it causes rash and  Social history: She is married.  Retired Investment banker, corporate at [employer].  She was referred by Juanetta Snow.  Has been married for nearly 64 years and is planning a celebration later this year.  2 granddaughters.  1 granddaughter attends [employer] and [employer].  Husband is a Land and has been teaching at [employer].  2 adult children, a son and a daughter.  She does not smoke or consume alcohol.  Native of [place].  Attended [employer].  Family history: Parents in their late 41s.  26 brothers.  1 brother died of complications of cancer.  4 sisters, 3 sisters are in good health but 1 sister with history of MI no other health issues.  Had pneumonia in 1977, throat surgery by Dr. Ernesto Rutherford in 1982 and 2 C-sections in 1975 in 1978  In 2021 she had ultrasound of thyroid because of possible thyromegaly on exam but ultrasound showed no discrete nodules and no significant enlargement      Colonoscopy 2019 with 10 year follow up recommended  Review of Systems  no new complaints.     Objective:   Physical Exam Blood pressure  120/80 pulse 60 weight 196 pounds BMI 35.28  Skin: Warm and dry.  No cervical adenopathy.  TMs clear.  No JVD thyromegaly or carotid bruits.  Chest is clear to auscultation without rales or wheezing.  Cardiac exam: Regular rate and rhythm.  Breast are without masses.  Abdomen obese soft nondistended without hepatosplenomegaly masses or tenderness.  GYN exam deferred to GYN physician.  No lower extremity pitting edema.  Neuro is intact without focal deficits.  Affect thought and judgment are normal.       Assessment & Plan:  Essential hypertension under excellent control on current regimen of amlodipine, atenolol clonidine and olmesartan HCT  History of gout treated with allopurinol  History of allergic rhinitis treated with Zyrtec and as needed albuterol inhaler  GE reflux treated with Nexium  History of elevated serum proteins and was referred to oncology in 2019.  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  Colonoscopy up-to-date done in 2019 with 10-year follow-up recommended  Hyperlipidemia stable on statin-Zocor 20 mg daily  Type 2 diabetes mellitus-hemoglobin A1c 7.3% and was 7.9% in December 2021.  It was 9.1% in September 2020.  She has made considerable improvement with current medication but has some questions about injectable medication and will be referred to Endocrinologist.  Currently on Janumet and glipizide   Subjective:   Patient presents  for Medicare Annual/Subsequent preventive examination.  Review Past Medical/Family/Social: See above   Risk Factors  Current exercise habits: Light Dietary issues discussed: Low-fat low carbohydrate  Cardiac risk factors: Diabetes mellitus and hyperlipidemia  Depression Screen  (Note: if answer to either of the following is "Yes", a more complete depression screening is indicated)   Over the past two weeks, have you felt down, depressed or  hopeless? No  Over the past two weeks, have you felt little interest or pleasure in doing things? No Have you lost interest or pleasure in daily life? No Do you often feel hopeless? No Do you cry easily over simple problems? No   Activities of Daily Living  In your present state of health, do you have any difficulty performing the following activities?:   Driving? No  Managing money? No  Feeding yourself? No  Getting from bed to chair? No  Climbing a flight of stairs? No  Preparing food and eating?: No  Bathing or showering? No  Getting dressed: No  Getting to the toilet? No  Using the toilet:No  Moving around from place to place: No  In the past year have you fallen or had a near fall?:No  Are you sexually active? No  Do you have more than one partner? No   Hearing Difficulties: No  Do you often ask people to speak up or repeat themselves? No  Do you experience ringing or noises in your ears? No  Do you have difficulty understanding soft or whispered voices? No  Do you feel that you have a problem with memory? No Do you often misplace items? No    Home Safety:  Do you have a smoke alarm at your residence? Yes Do you have grab bars in the bathroom?  Yes Do you have throw rugs in your house?  Yes   Cognitive Testing  Alert? Yes Normal Appearance?Yes  Oriented to person? Yes Place? Yes  Time? Yes  Recall of three objects? Yes  Can perform simple calculations? Yes  Displays appropriate judgment?Yes  Can read the correct time from a watch face?Yes   List the Names of Other Physician/Practitioners you currently use:  See referral list for the physicians patient is currently seeing.     Review of Systems: See above   Objective:     General appearance: Appears stated age and obese  Head: Normocephalic, without obvious abnormality, atraumatic  Eyes: conj clear, EOMi PEERLA  Ears: normal TM's and external ear canals both ears  Nose: Nares normal. Septum midline.  Mucosa normal. No drainage or sinus tenderness.  Throat: lips, mucosa, and tongue normal; teeth and gums normal  Neck: no adenopathy, no carotid bruit, no JVD, supple, symmetrical, trachea midline and thyroid not enlarged, symmetric, no tenderness/mass/nodules  No CVA tenderness.  Lungs: clear to auscultation bilaterally  Breasts: normal appearance, no masses or tenderness, top of the pacemaker on left upper chest. Incision well-healed. It is tender.  Heart: regular rate and rhythm, S1, S2 normal, no murmur, click, rub or gallop  Abdomen: soft, non-tender; bowel sounds normal; no masses, no organomegaly  Musculoskeletal: ROM normal in all joints, no crepitus, no deformity, Normal muscle strengthen. Back  is symmetric, no curvature. Skin: Skin color, texture, turgor normal. No rashes or lesions  Lymph nodes: Cervical, supraclavicular, and axillary nodes normal.  Neurologic: CN 2 -12 Normal, Normal symmetric reflexes. Normal coordination and gait  Psych: Alert & Oriented x 3, Mood appear stable.    Assessment:    Annual  wellness medicare exam   Plan:    During the course of the visit the patient was educated and counseled about appropriate screening and preventive services including:  Annual mammogram  Needs COVID booster this Fall  Tetanus immunization due in 2023      Patient Instructions (the written plan) was given to the patient.  Medicare Attestation  I have personally reviewed:  The patient's medical and social history  Their use of alcohol, tobacco or illicit drugs  Their current medications and supplements  The patient's functional ability including ADLs,fall risks, home safety risks, cognitive, and hearing and visual impairment  Diet and physical activities  Evidence for depression or mood disorders  The patient's weight, height, BMI, and visual acuity have been recorded in the chart. I have made referrals, counseling, and provided education to the patient based on  review of the above and I have provided the patient with a written personalized care plan for preventive services.

## 2021-06-15 ENCOUNTER — Other Ambulatory Visit: Payer: Self-pay | Admitting: Internal Medicine

## 2021-06-24 ENCOUNTER — Other Ambulatory Visit: Payer: Self-pay | Admitting: Internal Medicine

## 2021-07-10 DIAGNOSIS — R700 Elevated erythrocyte sedimentation rate: Secondary | ICD-10-CM | POA: Diagnosis not present

## 2021-07-10 DIAGNOSIS — G5601 Carpal tunnel syndrome, right upper limb: Secondary | ICD-10-CM | POA: Diagnosis not present

## 2021-07-10 DIAGNOSIS — Z6834 Body mass index (BMI) 34.0-34.9, adult: Secondary | ICD-10-CM | POA: Diagnosis not present

## 2021-07-10 DIAGNOSIS — D890 Polyclonal hypergammaglobulinemia: Secondary | ICD-10-CM | POA: Diagnosis not present

## 2021-07-10 DIAGNOSIS — M159 Polyosteoarthritis, unspecified: Secondary | ICD-10-CM | POA: Diagnosis not present

## 2021-07-10 DIAGNOSIS — E669 Obesity, unspecified: Secondary | ICD-10-CM | POA: Diagnosis not present

## 2021-07-10 DIAGNOSIS — R5383 Other fatigue: Secondary | ICD-10-CM | POA: Diagnosis not present

## 2021-07-10 DIAGNOSIS — M2550 Pain in unspecified joint: Secondary | ICD-10-CM | POA: Diagnosis not present

## 2021-07-13 ENCOUNTER — Other Ambulatory Visit: Payer: Self-pay | Admitting: Internal Medicine

## 2021-07-23 ENCOUNTER — Other Ambulatory Visit: Payer: Self-pay | Admitting: Internal Medicine

## 2021-07-23 DIAGNOSIS — Z1231 Encounter for screening mammogram for malignant neoplasm of breast: Secondary | ICD-10-CM

## 2021-09-03 ENCOUNTER — Other Ambulatory Visit: Payer: Self-pay

## 2021-09-03 ENCOUNTER — Ambulatory Visit
Admission: RE | Admit: 2021-09-03 | Discharge: 2021-09-03 | Disposition: A | Discharge status: Home / Self Care | Payer: Medicare PPO | Source: Ambulatory Visit | Attending: Internal Medicine | Admitting: Internal Medicine

## 2021-09-03 DIAGNOSIS — Z1231 Encounter for screening mammogram for malignant neoplasm of breast: Secondary | ICD-10-CM | POA: Diagnosis not present

## 2021-09-09 DIAGNOSIS — G5601 Carpal tunnel syndrome, right upper limb: Secondary | ICD-10-CM | POA: Diagnosis not present

## 2021-09-11 ENCOUNTER — Other Ambulatory Visit: Payer: Self-pay | Admitting: Internal Medicine

## 2021-10-08 DIAGNOSIS — R32 Unspecified urinary incontinence: Secondary | ICD-10-CM | POA: Diagnosis not present

## 2021-10-08 DIAGNOSIS — Z1231 Encounter for screening mammogram for malignant neoplasm of breast: Secondary | ICD-10-CM | POA: Diagnosis not present

## 2021-10-08 DIAGNOSIS — L900 Lichen sclerosus et atrophicus: Secondary | ICD-10-CM | POA: Diagnosis not present

## 2021-10-08 DIAGNOSIS — Z1211 Encounter for screening for malignant neoplasm of colon: Secondary | ICD-10-CM | POA: Diagnosis not present

## 2021-10-08 DIAGNOSIS — Z01419 Encounter for gynecological examination (general) (routine) without abnormal findings: Secondary | ICD-10-CM | POA: Diagnosis not present

## 2021-10-08 DIAGNOSIS — Z6833 Body mass index (BMI) 33.0-33.9, adult: Secondary | ICD-10-CM | POA: Diagnosis not present

## 2021-10-10 ENCOUNTER — Other Ambulatory Visit: Payer: Self-pay | Admitting: Internal Medicine

## 2021-10-13 ENCOUNTER — Encounter: Payer: Self-pay | Admitting: Internal Medicine

## 2021-10-13 ENCOUNTER — Ambulatory Visit: Payer: Medicare PPO | Admitting: Internal Medicine

## 2021-10-13 ENCOUNTER — Other Ambulatory Visit: Payer: Self-pay

## 2021-10-13 VITALS — BP 130/70 | HR 65 | Ht 62.5 in | Wt 191.0 lb

## 2021-10-13 DIAGNOSIS — E1159 Type 2 diabetes mellitus with other circulatory complications: Secondary | ICD-10-CM | POA: Diagnosis not present

## 2021-10-13 DIAGNOSIS — E1169 Type 2 diabetes mellitus with other specified complication: Secondary | ICD-10-CM

## 2021-10-13 DIAGNOSIS — E785 Hyperlipidemia, unspecified: Secondary | ICD-10-CM

## 2021-10-13 DIAGNOSIS — I152 Hypertension secondary to endocrine disorders: Secondary | ICD-10-CM | POA: Diagnosis not present

## 2021-10-13 DIAGNOSIS — E782 Mixed hyperlipidemia: Secondary | ICD-10-CM | POA: Diagnosis not present

## 2021-10-13 NOTE — Progress Notes (Signed)
Cardiology Office Note:    Date:  10/13/2021   ID:  Nancy Wagner, DOB 09-23-1951, MRN [id number]  PCP:  Elby Showers, MD  [facility] Cardiologist:  Werner Lean, MD  [place] Electrophysiologist:  None   CC: Blood pressure and heart block f/u  History of Present Illness:    Nancy Wagner is a 70 y.o. female with a hx of Obesity, DM with HTN, HLd who presents for evaluation 01/01/21. In interim of this visit, patient had echo and ziopatch. Had vagotonic II HB; planned to stop atenolol but patient was unable to be reached X 3.  Got voicemail and stopped atenolol and immediately have SBP 190.  Did not call in but returned to atenolol.  Seen 04/16/21.  No medication changes at that time:  we discussed heart block symptoms and that if they occurred she would need a new anti-hypertensive regimen.  Seen 10/13/21.  Patient notes that she is doing OK- she is sleepy today because she has been up since 430 AM.  She wakes up with her to do list for her 50th wedding anniversary and its very busy.   There are no interval hospital/ED visit.    No chest pain or pressure.  No SOB/DOE and no PND/Orthopnea.  No weight gain or leg swelling.  No palpitations- thought she notes that her rare beats have occurred for years and don't  ever cause of her.   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   GERD (gastroesophageal reflux disease)    diet control   Gout    12-08-2019  per pt last episode left foot 11/ 2020   Hirsutism    History of obstructive sleep apnea    s/p  UPPP in 1985   Hypertension    followed by pcp  (12-08-2019  per pt had stress test many yrs ago, was told ok)   Lichen simplex chronicus    OA (osteoarthritis)    RA (rheumatoid arthritis) ([place])    12-08-2019  per pt was dx years ago, treated with high level prednisone for few weeks  and some other treatment with her eyes and has been in remission since   Type 2 diabetes mellitus ([place])    followed by pcp ---  (12-08-2019 checks cbg's daily in am,  fasting-- 110 to 126)   Uterine fibroid    Wears glasses     Past Surgical History:  Procedure Laterality Date   [place]  last one 05-09-2018   DILATATION & CURRETTAGE/HYSTEROSCOPY WITH RESECTOCOPE N/A 04/07/2017   Procedure: Bright;  Surgeon: Eldred Manges, MD;  Location: [place];  Service: Gynecology;  Laterality: N/A;   ROBOTIC ASSISTED SALPINGO OOPHERECTOMY Bilateral 12/14/2019   Procedure: XI ROBOTIC ASSISTED SALPINGO OOPHORECTOMY;  Surgeon: Delsa Bern, MD;  Location: [facility];  Service: Gynecology;  Laterality: Bilateral;  Bed held, but patient is expected to go home the same day. -ap   THROAT SURGERY  1985   minimal uvulopalatopharyngoplasty for sleep apnea   TUBAL LIGATION Bilateral 1981    Current Medications: Current Meds  Medication Sig   allopurinol (ZYLOPRIM) 100 MG tablet TAKE 1 TABLET BY MOUTH EVERY DAY   amLODipine (NORVASC) 5 MG tablet TAKE 1 TABLET  BY MOUTH EVERY DAY   atenolol (TENORMIN) 100 MG tablet TAKE 1 TABLET BY MOUTH EVERY DAY   Blood Glucose Monitoring Suppl (ACCU-CHEK GUIDE ME) w/Device KIT USE AS DIRECTED   cetirizine (ZYRTEC) 10 MG tablet Take 10 mg by mouth as needed.   Cholecalciferol (VITAMIN D3) 50 MCG (2000 UT) TABS Take 1 tablet by mouth daily.   cloNIDine (CATAPRES) 0.1 MG tablet Take 1 tablet (0.1 mg total) by mouth 2 (two) times daily.   cyclobenzaprine (FLEXERIL) 10 MG tablet TAKE ONE HALF TABLET BY MOUTH AT BEDTIME FOR MUSCULOSKELETAL PAIN (Patient taking differently: as needed. TAKE ONE HALF TABLET BY MOUTH AT BEDTIME FOR MUSCULOSKELETAL PAIN)   esomeprazole (NEXIUM) 40 MG capsule TAKE 1 CAPSULE BY MOUTH EVERY DAY   fish oil-omega-3 fatty acids  1000 MG capsule Take 2 g by mouth at bedtime. Reported on 1/61/0960   folic acid (FOLVITE) 454 MCG tablet Take 400 mcg by mouth at bedtime.    glipiZIDE (GLUCOTROL XL) 5 MG 24 hr tablet TAKE 1 TABLET BY MOUTH EVERY DAY WITH BREAKFAST   JANUMET XR [id number] MG TB24 TAKE 1 TABLET BY MOUTH EVERY DAY WITH BREAKFAST   Multiple Vitamins-Minerals (MULTIVITAMIN WOMEN 50+) TABS Take 1 tablet by mouth at bedtime.   olmesartan-hydrochlorothiazide (BENICAR HCT) 40-25 MG tablet TAKE 1 TABLET BY MOUTH EVERY DAY   PROAIR HFA 108 (90 Base) MCG/ACT inhaler TAKE 2 PUFFS BY MOUTH EVERY 6 HOURS AS NEEDED FOR WHEEZE OR SHORTNESS OF BREATH   simvastatin (ZOCOR) 20 MG tablet TAKE 1 TABLET BY MOUTH EVERY DAY     Allergies:   Adhesive [tape], Codeine, and Other   Social History   Socioeconomic History   Marital status: Married    Spouse name: Not on file   Number of children: 2   Years of education: Not on file   Highest education level: Not on file  Occupational History   Not on file  Tobacco Use   Smoking status: Never   Smokeless tobacco: Never  Vaping Use   Vaping Use: Never used  Substance and Sexual Activity   Alcohol use: Yes    Comment: very rare   Drug use: Never   Sexual activity: Not on file  Other Topics Concern   Not on file  Social History Narrative   Not on file   Social Determinants of Health   Financial Resource Strain: Not on file  Food Insecurity: Not on file  Transportation Needs: Not on file  Physical Activity: Not on file  Stress: Not on file  Social Connections: Not on file    Social: is about to celebrate her 33th wedding anniversary; she is the oldest of 46  Family History: History of coronary artery disease notable for father. History of heart failure notable for no members. History of arrhythmia notable for no members. Sister has and aunt had heart problems of some sort.  ROS:   Please see the history of present illness.     All other systems reviewed and are  negative.  EKGs/Labs/Other Studies Reviewed:    The following studies were reviewed today:  EKG:   10/13/21: SR with PACs  01/01/21: NSR rate 70 WNL 12/11/19: NSR 64 WNL  Cardiac Event Monitoring: Date: 02/12/21 Results: Patient had a minimum heart rate of 49 bpm, maximum heart rate of 150 bpm, and average heart rate of 66 bpm through two devices. Computer algorithm reads heart rates of 29 and 37 bpm; this is based on the dropped beat  of a Mobitz Type I heart block. Predominant underlying rhythm was sinus rhythm. One run of non-sustained ventricular tachycardia occurred lasting 4 beats at longest with a max rate of 150 bpm at fastest. Isolated PACs were rare (<1.0%), with rare couplets or triplets present. Isolated PVCs were rare (<1.0%), with rare couplets present. There is Mobitz Type I heart block and nocturnal Mobitz Type II heart block (01/13/21 4:28 AM). No triggered and diary events.    Transthoracic Echocardiogram: Date: 01/22/21 Results: No evidence of significant aortic dilation when indexed for height  1. Left ventricular ejection fraction, by estimation, is 60 to 65%. The  left ventricle has normal function. The left ventricle has no regional  wall motion abnormalities. There is mild asymmetric left ventricular  hypertrophy of the basal-septal segment.  Left ventricular diastolic parameters are consistent with Grade I  diastolic dysfunction (impaired relaxation). Elevated left atrial  pressure. The E/e' is 29.   2. Right ventricular systolic function is normal. The right ventricular  size is normal. There is normal pulmonary artery systolic pressure.   3. The mitral valve is normal in structure. Trivial mitral valve  regurgitation.   4. The aortic valve is tricuspid. There is mild calcification of the  aortic valve. There is mild thickening of the aortic valve. Aortic valve  regurgitation is not visualized. Mild aortic valve sclerosis is present,  with no evidence of  aortic valve  stenosis.   5. Aortic dilatation noted. 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   6. The inferior vena cava is normal in size with greater than 50%  respiratory variability, suggesting right atrial pressure of 3 mmHg.    Recent Labs: 01/01/2021: NT-Pro BNP 209 05/29/2021: ALT 28; BUN 15; Creat 1.11; Hemoglobin 12.1; Platelets 236; Potassium 3.9; Sodium 138; TSH 2.48  Recent Lipid Panel    Component Value Date/Time   CHOL 172 05/29/2021 0918   TRIG 154 (H) 05/29/2021 0918   HDL 55 05/29/2021 0918   CHOLHDL 3.1 05/29/2021 0918   VLDL 18 02/08/2017 1121   LDLCALC 91 05/29/2021 0918    Physical Exam:    VS:  BP 130/70   Pulse 65   Ht 5' 2.5" (1.588 m)   Wt 191 lb (86.6 kg)   SpO2 97%   BMI 34.38 kg/m     Wt Readings from Last 3 Encounters:  10/13/21 191 lb (86.6 kg)  06/09/21 196 lb (88.9 kg)  04/16/21 197 lb (89.4 kg)    Gen: no  distress, obesity Neck: No JVD Cardiac: No Rubs or Gallops, no murmur, normal rate, +2 radial pulses Respiratory: Clear to auscultation bilaterally, normal effort, normal  respiratory rate GI: Soft, nontender, non-distended  MS: No  edema;  moves all extremities Integument: Skin feels warm Neuro:  At time of evaluation, alert and oriented to person/place/time/situation  Psych: Normal affect, patient feels well, thought tired    ASSESSMENT:    1. Mixed hyperlipidemia   2. Hypertension associated with diabetes ([place])   3. 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- asymptomatic HLD with DM  - unable to view distant [facility] records for confirmation; if new sx occur will get Lexiscan - ambulatory blood pressure at goal on atenolol and current regimen, will continue ambulatory BP monitoring - continue home medications: patient is well controlled on clonidine 0.1 mg PO BID, atenolol 100 mg PO  BID (we have discussed  changing this if she has more HB,  - continue norvasc 5 mg PO daily and olmesartan-HCTZ - continue current statin    Will plan for one year follow up unless new symptoms or abnormal test results warranting change in plan    Medication Adjustments/Labs and Tests Ordered: Current medicines are reviewed at length with the patient today.  Concerns regarding medicines are outlined above.  Orders Placed This Encounter  Procedures   EKG 12-Lead    No orders of the defined types were placed in this encounter.   Patient Instructions  Medication Instructions:  Your physician recommends that you continue on your current medications as directed. Please refer to the Current Medication list given to you today.  *If you need a refill on your cardiac medications before your next appointment, please call your pharmacy*   Lab Work: NONE If you have labs (blood work) drawn today and your tests are completely normal, you will receive your results only by: [place] (if you have MyChart) OR A paper copy in the mail If you have any lab test that is abnormal or we need to change your treatment, we will call you to review the results.   Testing/Procedures: NONE   Follow-Up: At [facility], you and your health needs are our priority.  As part of our continuing mission to provide you with exceptional heart care, we have created designated Provider Care Teams.  These Care Teams include your primary Cardiologist (physician) and Advanced Practice Providers (APPs -  Physician Assistants and Nurse Practitioners) who all work together to provide you with the care you need, when you need it.  We recommend signing up for the patient portal called "MyChart".  Sign up information is provided on this After Visit Summary.  MyChart is used to connect with patients for Virtual Visits (Telemedicine).  Patients are able to view lab/test results, encounter notes, upcoming appointments, etc.  Non-urgent  messages can be sent to your provider as well.   To learn more about what you can do with MyChart, go to [email or web address].    Your next appointment:   12 month(s)  The format for your next appointment:   In Person  Provider:   Werner Lean, MD          Signed, Werner Lean, MD  10/13/2021 9:48 AM    [place]

## 2021-10-13 NOTE — Patient Instructions (Signed)
Medication Instructions:  Your physician recommends that you continue on your current medications as directed. Please refer to the Current Medication list given to you today.  *If you need a refill on your cardiac medications before your next appointment, please call your pharmacy*   Lab Work: NONE If you have labs (blood work) drawn today and your tests are completely normal, you will receive your results only by: [place] (if you have MyChart) OR A paper copy in the mail If you have any lab test that is abnormal or we need to change your treatment, we will call you to review the results.   Testing/Procedures: NONE   Follow-Up: At [facility], you and your health needs are our priority.  As part of our continuing mission to provide you with exceptional heart care, we have created designated Provider Care Teams.  These Care Teams include your primary Cardiologist (physician) and Advanced Practice Providers (APPs -  Physician Assistants and Nurse Practitioners) who all work together to provide you with the care you need, when you need it.  We recommend signing up for the patient portal called "MyChart".  Sign up information is provided on this After Visit Summary.  MyChart is used to connect with patients for Virtual Visits (Telemedicine).  Patients are able to view lab/test results, encounter notes, upcoming appointments, etc.  Non-urgent messages can be sent to your provider as well.   To learn more about what you can do with MyChart, go to [email or web address].    Your next appointment:   12 month(s)  The format for your next appointment:   In Person  Provider:   Werner Lean, MD

## 2021-11-07 ENCOUNTER — Other Ambulatory Visit: Payer: Self-pay | Admitting: Internal Medicine

## 2021-11-21 ENCOUNTER — Other Ambulatory Visit: Payer: Self-pay | Admitting: Internal Medicine

## 2021-11-29 ENCOUNTER — Other Ambulatory Visit: Payer: Self-pay | Admitting: Internal Medicine

## 2021-12-08 ENCOUNTER — Other Ambulatory Visit: Payer: Self-pay

## 2021-12-08 ENCOUNTER — Other Ambulatory Visit: Payer: Medicare PPO | Admitting: Internal Medicine

## 2021-12-08 DIAGNOSIS — I10 Essential (primary) hypertension: Secondary | ICD-10-CM

## 2021-12-08 DIAGNOSIS — E1169 Type 2 diabetes mellitus with other specified complication: Secondary | ICD-10-CM | POA: Diagnosis not present

## 2021-12-08 DIAGNOSIS — E119 Type 2 diabetes mellitus without complications: Secondary | ICD-10-CM

## 2021-12-08 DIAGNOSIS — E785 Hyperlipidemia, unspecified: Secondary | ICD-10-CM | POA: Diagnosis not present

## 2021-12-09 ENCOUNTER — Ambulatory Visit: Payer: Medicare PPO | Admitting: Internal Medicine

## 2021-12-09 ENCOUNTER — Encounter: Payer: Self-pay | Admitting: Internal Medicine

## 2021-12-09 VITALS — BP 130/62 | HR 57 | Temp 97.4°F | Ht 62.5 in | Wt 199.0 lb

## 2021-12-09 DIAGNOSIS — Z8739 Personal history of other diseases of the musculoskeletal system and connective tissue: Secondary | ICD-10-CM | POA: Diagnosis not present

## 2021-12-09 DIAGNOSIS — Z6835 Body mass index (BMI) 35.0-35.9, adult: Secondary | ICD-10-CM

## 2021-12-09 DIAGNOSIS — G5601 Carpal tunnel syndrome, right upper limb: Secondary | ICD-10-CM | POA: Diagnosis not present

## 2021-12-09 DIAGNOSIS — I10 Essential (primary) hypertension: Secondary | ICD-10-CM | POA: Diagnosis not present

## 2021-12-09 DIAGNOSIS — E785 Hyperlipidemia, unspecified: Secondary | ICD-10-CM

## 2021-12-09 DIAGNOSIS — I441 Atrioventricular block, second degree: Secondary | ICD-10-CM | POA: Diagnosis not present

## 2021-12-09 DIAGNOSIS — D890 Polyclonal hypergammaglobulinemia: Secondary | ICD-10-CM | POA: Diagnosis not present

## 2021-12-09 DIAGNOSIS — E1169 Type 2 diabetes mellitus with other specified complication: Secondary | ICD-10-CM

## 2021-12-09 DIAGNOSIS — M1712 Unilateral primary osteoarthritis, left knee: Secondary | ICD-10-CM

## 2021-12-09 LAB — HEMOGLOBIN A1C
Hgb A1c MFr Bld: 7.1 % of total Hgb — ABNORMAL HIGH (ref <5.7)
Mean Plasma Glucose: 157 mg/dL
eAG (mmol/L): 8.7 mmol/L

## 2021-12-09 LAB — HEPATIC FUNCTION PANEL
AG Ratio: 1.2 (calc) (ref 1.0–2.5)
ALT: 13 U/L (ref 6–29)
AST: 17 U/L (ref 10–35)
Albumin: 4 g/dL (ref 3.6–5.1)
Alkaline phosphatase (APISO): 54 U/L (ref 37–153)
Bilirubin, Direct: 0.1 mg/dL (ref 0.0–0.2)
Globulin: 3.3 g/dL (calc) (ref 1.9–3.7)
Indirect Bilirubin: 0.2 mg/dL (calc) (ref 0.2–1.2)
Total Bilirubin: 0.3 mg/dL (ref 0.2–1.2)
Total Protein: 7.3 g/dL (ref 6.1–8.1)

## 2021-12-09 LAB — LIPID PANEL
Cholesterol: 169 mg/dL (ref <200)
HDL: 62 mg/dL (ref >=50)
LDL Cholesterol (Calc): 79 mg/dL (calc)
Non-HDL Cholesterol (Calc): 107 mg/dL (calc) (ref <130)
Total CHOL/HDL Ratio: 2.7 (calc) (ref <5.0)
Triglycerides: 186 mg/dL — ABNORMAL HIGH (ref <150)

## 2021-12-09 MED ORDER — GLIPIZIDE ER 10 MG PO TB24: 10.0000 mg | ORAL_TABLET | Freq: Every day | ORAL | 0 refills | Status: DC

## 2021-12-09 MED ORDER — SIMVASTATIN 40 MG PO TABS: 40.0000 mg | ORAL_TABLET | Freq: Every day | ORAL | 3 refills | Status: DC

## 2021-12-09 NOTE — Patient Instructions (Addendum)
Increase Simvastatin to 40 mg daily.  Watch diet.  Try to get some exercise.  Please follow-up with cardiologist regarding heart block that was discovered with wearing heart monitor.  Follow-up here in 3 months regarding hyperlipidemia.  Take glipizide

## 2021-12-09 NOTE — Progress Notes (Signed)
Subjective:    Patient ID: Nancy Wagner, female    DOB: Dec 14, 1950, 71 y.o.   MRN: [id number]  HPI 71 year old Female seen for 97-month follow up on diabetes mellitus. Has stopped checking accuchecks daily. Ate chocolate cake the evening before lab draw.  She has been prescribed Janumet XR 100/1000 daily.  Hemoglobin A1c done prior to this visit is 7.1% and was 7.3% in June.  We previously discussed referral to endocrinologist.  She prefers to continue with diet and exercise and taking current medications.  I have suggested glipizide 10 mg XL daily with breakfast.  Follow-up in March.  Her physical exam has been scheduled for July.  Hx Type 2 diabetes, HTN, hyperlipidemia ,IgA and IgM    gammopathy seen by Dr. Trudie Reed manifesting as elevated sed rate and joint pain in multiple sites.  Also has primary osteoarthritis involving multiple joints and history of carpal tunnel right wrist.     Has seen Dr. Fredna Dow for right carpal tunnel syndrome and has not had surgery.  Was last seen in June 2022 and her nerve conduction studies were consistent with carpal tunnel syndrome.  Treatment options were discussed and she underwent an injection right wrist for carpal tunnel syndrome.                                 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 Does not want booster. Patient to call us with last Covid booster. Had Covid-19 in January 2022.  Has seen Dr. Trudie Reed for multiple joint pain History of COVID-18 December 2020  Dr. Cletis Media does GYN exam.  Seen by Cardiology February 2022 for palpitations. An echo was recommended for heart murmur.  Echo was normal.    Has history of difficult to control hypertension requiring multiple medications.  Currently on amlodipine, Benicar HCTZ and Tenormin as well as clonidine.  Blood pressure is under good control today.  Also had long-term heart monitor.  Was  found to have Mobitz 1 heart block.  Also had nocturnal Mobitz 2 heart block.  It was recommended that beta-blocker be discontinued.  Had thyroid ultrasound in July 2021 for possible enlargement but thyroid ultrasound was normal at that time with no discrete nodules.  TSH has been normal.  She is not on thyroid medication.     Objective:   Physical Exam Blood pressure 130/62 pulse 57 regular, temperature 97.4 pulse oximetry 99% BMI 35.82 weight 199 pounds Skin warm and dry.  Nodes none.  Chest clear to auscultation.  Cardiac exam: Regular rate and rhythm without ectopy.  No pitting edema of the lower extremities.      Assessment & Plan:  Essential hypertension- stable on current regimen-requires multidrug therapy including amlodipine olmesartan HCTZ and was on atenolol until she developed heart block and was supposed to stop that.  She is also on Catapres.  BMI 35-needs to work on diet and exercise  History of heart block based on long-term monitoring needs follow-up with cardiology.  Was encouraged to call for an appointment.  Type 2 diabetes mellitus-would like to see hemoglobin A1c below 7%.  Offered referral to Endocrinology.  She will return here in April for follow-up.  Has not been watching her diet apparently.  Take glipizide XL 10 mg daily.  Continue Janumet XR100-1000 daily  Creatinine  elevated at 1.11-continue to monitor for chronic kidney disease development.  Needs to be well-hydrated when blood is drawn.  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 Trudie Reed  Hyperlipidemia-increase simvastatin to 40 mg daily.  Triglycerides are slightly elevated at 154.  GE reflux treated with Nexium  History of gout treated with allopurinol   Plan: Follow-up in 3 months regarding diabetic control.  Encouraged her to call Cardiologist for follow-up.  Increase simvastatin to 40 mg  daily.  Trying to get triglycerides under control.  Encourage diet exercise and weight loss.  Adding glipizide XL to Janumet

## 2022-01-09 ENCOUNTER — Other Ambulatory Visit: Payer: Self-pay | Admitting: Internal Medicine

## 2022-02-12 ENCOUNTER — Other Ambulatory Visit: Payer: Self-pay | Admitting: Internal Medicine

## 2022-03-02 ENCOUNTER — Other Ambulatory Visit: Payer: Medicare PPO

## 2022-03-02 DIAGNOSIS — E1169 Type 2 diabetes mellitus with other specified complication: Secondary | ICD-10-CM | POA: Diagnosis not present

## 2022-03-02 DIAGNOSIS — E8881 Metabolic syndrome: Secondary | ICD-10-CM | POA: Diagnosis not present

## 2022-03-02 DIAGNOSIS — E669 Obesity, unspecified: Secondary | ICD-10-CM | POA: Diagnosis not present

## 2022-03-03 LAB — HEMOGLOBIN A1C
Hgb A1c MFr Bld: 7.1 % of total Hgb — ABNORMAL HIGH (ref <5.7)
Mean Plasma Glucose: 157 mg/dL
eAG (mmol/L): 8.7 mmol/L

## 2022-03-06 ENCOUNTER — Other Ambulatory Visit: Payer: Self-pay | Admitting: Internal Medicine

## 2022-03-08 ENCOUNTER — Other Ambulatory Visit: Payer: Self-pay | Admitting: Internal Medicine

## 2022-03-12 ENCOUNTER — Ambulatory Visit (INDEPENDENT_AMBULATORY_CARE_PROVIDER_SITE_OTHER): Payer: Medicare PPO | Admitting: Internal Medicine

## 2022-03-12 ENCOUNTER — Encounter: Payer: Self-pay | Admitting: Internal Medicine

## 2022-03-12 VITALS — BP 118/78 | HR 64 | Temp 98.2°F | Ht 62.5 in | Wt 198.0 lb

## 2022-03-12 DIAGNOSIS — Z8739 Personal history of other diseases of the musculoskeletal system and connective tissue: Secondary | ICD-10-CM | POA: Diagnosis not present

## 2022-03-12 DIAGNOSIS — E785 Hyperlipidemia, unspecified: Secondary | ICD-10-CM

## 2022-03-12 DIAGNOSIS — E1169 Type 2 diabetes mellitus with other specified complication: Secondary | ICD-10-CM

## 2022-03-12 DIAGNOSIS — Z1231 Encounter for screening mammogram for malignant neoplasm of breast: Secondary | ICD-10-CM

## 2022-03-12 DIAGNOSIS — I10 Essential (primary) hypertension: Secondary | ICD-10-CM | POA: Diagnosis not present

## 2022-03-12 DIAGNOSIS — G5601 Carpal tunnel syndrome, right upper limb: Secondary | ICD-10-CM

## 2022-03-12 DIAGNOSIS — Z6835 Body mass index (BMI) 35.0-35.9, adult: Secondary | ICD-10-CM | POA: Diagnosis not present

## 2022-03-12 NOTE — Progress Notes (Signed)
? ?Subjective:  ? ? Patient ID: Nancy Wagner, female    DOB: Jun 12, 1951, 71 y.o.   MRN: [id number] ? ?HPI 71 year old Female seen for 55-monthfollow up. Mammogram has been ordered.  She had colonoscopy in 2019 by Dr. JArdis Hughs  Follow-up due in 2029.  She had internal/external hemorrhoids otherwise study was negative. ? ?Her labs are reviewed.  Hemoglobin A1c remains 7.1%.  It was 7.1% in January 2023 and 7.3% in June 2022.  I mentioned endocrinology referral but she declines at this time.  She wants to continue to work on diet exercise and weight loss.  She remains on Janumet Exar 100/1000 daily and glipizide XL 10 mg daily. ? ?She has hard to control hypertension and is on multiple medications including Benicar HCT 40/25, Tenormin 100 mg daily, clonidine tablets 0.1 mg twice daily and amlodipine 5 mg daily.  I am reluctant to increase amlodipine due to possibility of lower extremity edema. ? ?She was referred to cardiology and February 2022 regarding heart flutter.  Was complaining of PND and orthopnea.  Had 2/6 systolic ejection murmur.  Was advised to get echocardiogram.  A monitor was recommended for palpitations.  She was found to have a Mobitz type I heart block and a nocturnal Mobitz type II heart block.  She was told to stop atenolol.  There was 1 run of nonsustained V. tach lasting 4 beats.  Her echocardiogram was normal.  She was seen again in November 2022 by cardiologist.  Apparently she resumed her atenolol and cardiologist left her on it. ? ?She is on simvastatin 40 mg daily. ? ?She takes Nexium for GE reflux and allopurinol for history of gout. ? ?Her lipid panel in January showed total cholesterol of 169, HDL of 62 and triglycerides of 186 increased from 154 in June 2022.  LDL cholesterol has gone down to 79 from 91 in June 2022.  Liver functions are normal. ? ?I really think she could benefit from using semaglutide for weight loss and diabetic control.  She does not want to consider an injection at  this time. ? ?She will need tetanus update this year and should get that through the pharmacy.  Also has not had Shingrix vaccine according to our records and should have that at the pharmacy as well. ? ?History of COVID-18 December 2020. ? ?In January 2021 she had surgery to remove the tumor causing elevated testosterone.  This was a robotic assisted salpingo oophorectomy done by Dr. RCletis Media  This was diagnosed as a benign Lydick cell tumor.  Surgery was complicated by large abdominal wall hematoma.  First testosterone normalized after this surgery. ? ?She saw Dr. KFredna Dowregarding carpal tunnel syndrome right hand in October 2022 and received an injection with lidocaine and triamcinolone.  This helped.  She also saw cardiologist in November 2020 to Dr. CGasper Sells  Patient had undergone 2D echocardiogram and wore Zio patch.  Had been referred there regarding heart block. ? ?She has a 6 mm cystic lesion being followed in the tail of the pancreas by Dr. JEdison Nasuti  Last MRI was in 2022.  Repeat study recommended in 2024. ? ?Review of Systems Hx of osteoarthritis of knees. Has had Fleogeni injections several years ? ?   ?Objective:  ? Physical Exam ?Blood pressure is 118/78 pulse 64 temperature 98.2 degrees pulse oximetry 97% weight 198 pounds BMI 35.64.  She has gained 2 pounds since July 2022. ?Chest is clear to auscultation.  Cardiac exam: Occasional irregular contraction.  No lower extremity pitting edema. ? ? ?   ?Assessment & Plan:  ?History of heart block on beta-blocker but patient restarted it due to elevated blood pressure and is being monitored by cardiology ? ?Type 2 diabetes mellitus-hemoglobin A1c in April was 7.1%.  It has been in the 7% range since 2021.  At 1 point was 9.1% in September 2020.  Suggested endocrinology referral as she might need newer agents but she has declined. ? ?Morbid obesity-not motivated to diet and exercise.  BMI 35.64 ? ?Difficult to control hypertension requiring multiple agents.   Currently blood pressure excellent at 118/78 ? ?Plan: Follow-up for health maintenance exam and Medicare wellness exam July 2023. ? ?Hypertriglyceridemia despite being on simvastatin 40 mg daily ? ?

## 2022-03-18 DIAGNOSIS — H0011 Chalazion right upper eyelid: Secondary | ICD-10-CM | POA: Diagnosis not present

## 2022-03-29 ENCOUNTER — Other Ambulatory Visit: Payer: Self-pay | Admitting: Internal Medicine

## 2022-03-29 NOTE — Patient Instructions (Addendum)
I would like for you to consider endocrinology referral which would help weight loss and diabetic control.  Please try to work on diet and exercise some.  For now, continue current medications and follow-up.  July for Medicare wellness visit and health maintenance exam.  Please have mammogram. ?

## 2022-04-20 ENCOUNTER — Ambulatory Visit
Admission: RE | Admit: 2022-04-20 | Discharge: 2022-04-20 | Disposition: A | Discharge status: Home / Self Care | Payer: Medicare PPO | Source: Ambulatory Visit | Attending: Internal Medicine | Admitting: Internal Medicine

## 2022-04-20 ENCOUNTER — Ambulatory Visit (INDEPENDENT_AMBULATORY_CARE_PROVIDER_SITE_OTHER): Payer: Medicare PPO | Admitting: Internal Medicine

## 2022-04-20 ENCOUNTER — Encounter: Payer: Self-pay | Admitting: Internal Medicine

## 2022-04-20 VITALS — BP 140/80 | HR 68 | Temp 97.5°F | Ht 62.5 in | Wt 194.0 lb

## 2022-04-20 DIAGNOSIS — M545 Low back pain, unspecified: Secondary | ICD-10-CM | POA: Diagnosis not present

## 2022-04-20 DIAGNOSIS — M419 Scoliosis, unspecified: Secondary | ICD-10-CM | POA: Diagnosis not present

## 2022-04-20 DIAGNOSIS — R82998 Other abnormal findings in urine: Secondary | ICD-10-CM

## 2022-04-20 DIAGNOSIS — R109 Unspecified abdominal pain: Secondary | ICD-10-CM | POA: Diagnosis not present

## 2022-04-20 DIAGNOSIS — M19012 Primary osteoarthritis, left shoulder: Secondary | ICD-10-CM | POA: Diagnosis not present

## 2022-04-20 DIAGNOSIS — R0781 Pleurodynia: Secondary | ICD-10-CM

## 2022-04-20 DIAGNOSIS — M47814 Spondylosis without myelopathy or radiculopathy, thoracic region: Secondary | ICD-10-CM | POA: Diagnosis not present

## 2022-04-20 DIAGNOSIS — R55 Syncope and collapse: Secondary | ICD-10-CM | POA: Diagnosis not present

## 2022-04-20 LAB — POCT URINALYSIS DIPSTICK
Bilirubin, UA: NEGATIVE
Blood, UA: NEGATIVE
Glucose, UA: NEGATIVE
Ketones, UA: NEGATIVE
Nitrite, UA: NEGATIVE
Protein, UA: NEGATIVE
Spec Grav, UA: 1.015 (ref 1.010–1.025)
Urobilinogen, UA: 0.2 E.U./dL
pH, UA: 6 (ref 5.0–8.0)

## 2022-04-20 MED ORDER — HYDROCODONE-ACETAMINOPHEN 10-325 MG PO TABS: 1.0000 | ORAL_TABLET | Freq: Three times a day (TID) | ORAL | 0 refills | Status: AC | PRN

## 2022-04-20 NOTE — Patient Instructions (Addendum)
I am concerned you may have had a heart arrythmia causing a "black out" spell. We are referring you back to Cardiology. Urine specimen is abnormal. No urinary symptoms.  A culture has been sent of your urine.  Please have x-rays at [facility].  You have been given a small quantity of hydrocodone APAP 10/325 to take sparingly for pain.  Please do not overexert yourself.

## 2022-04-20 NOTE — Progress Notes (Signed)
Subjective:    Patient ID: Nancy Wagner, female    DOB: Jun 02, 1951, 71 y.o.   MRN: [id number]  HPI 71 year old Female seen today for an acute visit.  About 3 weeks ago, she was driving to [place] on an [employer] about 30 miles from near [place] ,[place].  She stopped at a rest area to use the restroom.  She felt fine getting out of her car.  She subsequently collapsed on the sidewalk.  She had no warning about this impending syncope.  No seizure activity was reported by bystanders.  She came to.  Someone assisted her with some minor abrasions.  She got back in her car and drove on to [facility] [address] and did okay.  She denied chest pain or shortness of breath.  No CNS symptoms.  Has never had syncope like this previously.  She has a history of type 2 diabetes mellitus and hyperlipidemia.  History of hypertension.  She had pneumonia in 1977, throat surgery by Dr. Ernesto Rutherford in 1982 and 2 C-sections in 1975 in 1978.  She has had difficult to control hypertension and has required multiple medications.  For diabetes she is on Glucotrol and Janumet.  For hypertension she is on Benicar HCT, clonidine, amlodipine .  Cardiologist suggested that she stop atenolol  in November 2022.  Cardiac event monitoring in March 2022 showed a maximum heart rate of 150 bpm and an average heart rate of 66 bpm.  She had Mobitz 1 heart block.  1 nonsustained V. tach run lasting 4 beats.  Isolated PACs and isolated PVCs all of which were rare.  Nocturnal Mobitz 2 heart block was noted as well.  History of gout treated with Zyloprim.  History of GE reflux treated with Nexium.  Dr. Gasper Sells is her cardiologist.  She saw him in November  Has had considerable musculoskeletal pain since her syncopal episode.  Has pain in her thoracic spine and lumbar spine areas.  Is also having left rib cage pain and axillary line.  Have ordered x-rays including thoracic spine, lumbar spine and left unilateral  rib films.  Is requesting some pain medication for musculoskeletal pain.  She had echocardiogram in February 2022 showing grade 1 diastolic dysfunction, left ventricular ejection fraction by estimation 60 to 65% and left ventricular function described as normal.  Elevated left atrial pressure was present.  Had mild calcification of the aortic valve.  Aortic dilatation noted.  Mild dilatation of the ascending aorta.  Review of Systems Patient wore Zio patch March 2022 and was noted to have a minimum heart rate of 49 bpm and a maximum heart rate of 150 bpm.  Predominant rhythm was sinus rhythm.  Had 1 run of nonsustained V. tach occurring lasting 4 beats.  She had isolated PACs and isolated PVCs.  She had a Mobitz type I heart block and a Mobitz type II heart block.    Objective:   Physical Exam She is alert and oriented and in no acute distress but complaining of musculoskeletal pain in back and left rib cage area.  Blood pressure is 140/80 pulse 68 temperature 97.5 degrees pulse oximetry 98% BMI 34.92.  Weight 194 pounds.  Skin: Warm and dry.  No carotid bruits.  Chest is clear to auscultation.  Cardiac exam: Irregular rhythm.  No pitting edema of the lower extremities.       Assessment & Plan:  History of Mobitz 1 and Mobitz 2 heart blocks on cardiac event monitoring in March 2022.  Patient has done well in the interim until she drove to [facility] and had syncopal episode outside of restroom at a rest stop.  My feeling is that she likely had a dysrhythmia at the rest stop.  Today she is having musculoskeletal pain as a result of her fall from syncope at that time.  She is going to have x-rays to rule out compression fracture and rib fracture.  Have advised her to take it easy and not to overexert herself.  We will recommend that she see cardiologist immediately.  She is requesting pain medication and was given small quantity of Norco 10/325 to take 1/2 to 1 tablet up to 3  times daily with food if needed for musculoskeletal pain.  Patient advised to take this medication sparingly.

## 2022-04-21 LAB — URINE CULTURE
MICRO NUMBER:: 13427578
SPECIMEN QUALITY:: ADEQUATE

## 2022-04-23 ENCOUNTER — Ambulatory Visit: Payer: Medicare PPO | Admitting: Physician Assistant

## 2022-04-23 ENCOUNTER — Encounter: Payer: Self-pay | Admitting: Physician Assistant

## 2022-04-23 VITALS — BP 118/66 | HR 62 | Ht 62.5 in | Wt 193.0 lb

## 2022-04-23 DIAGNOSIS — E1159 Type 2 diabetes mellitus with other circulatory complications: Secondary | ICD-10-CM | POA: Diagnosis not present

## 2022-04-23 DIAGNOSIS — E782 Mixed hyperlipidemia: Secondary | ICD-10-CM | POA: Diagnosis not present

## 2022-04-23 DIAGNOSIS — I152 Hypertension secondary to endocrine disorders: Secondary | ICD-10-CM | POA: Diagnosis not present

## 2022-04-23 DIAGNOSIS — E1169 Type 2 diabetes mellitus with other specified complication: Secondary | ICD-10-CM | POA: Diagnosis not present

## 2022-04-23 DIAGNOSIS — E669 Obesity, unspecified: Secondary | ICD-10-CM

## 2022-04-23 DIAGNOSIS — I441 Atrioventricular block, second degree: Secondary | ICD-10-CM | POA: Diagnosis not present

## 2022-04-23 DIAGNOSIS — R55 Syncope and collapse: Secondary | ICD-10-CM

## 2022-04-23 MED ORDER — AMLODIPINE BESYLATE 10 MG PO TABS: 10.0000 mg | ORAL_TABLET | Freq: Every day | ORAL | 2 refills | Status: DC

## 2022-04-23 NOTE — Patient Instructions (Addendum)
Medication Instructions:   START TAKING: AMLODIPINE  10 MG ONCE A DAY   STOP TAKING AND REMOVE THIS MEDICATION FROM YOUR MEDICATION LIST: ATENOLOL    *If you need a refill on your cardiac medications before your next appointment, please call your pharmacy*   Lab Work: NONE ORDERED  TODAY    If you have labs (blood work) drawn today and your tests are completely normal, you will receive your results only by: [place] (if you have MyChart) OR A paper copy in the mail If you have any lab test that is abnormal or we need to change your treatment, we will call you to review the results.   Testing/Procedures: NONE ORDERED  TODAY    Follow-Up: At [facility], you and your health needs are our priority.  As part of our continuing mission to provide you with exceptional heart care, we have created designated Provider Care Teams.  These Care Teams include your primary Cardiologist (physician) and Advanced Practice Providers (APPs -  Physician Assistants and Nurse Practitioners) who all work together to provide you with the care you need, when you need it.  We recommend signing up for the patient portal called "MyChart".  Sign up information is provided on this After Visit Summary.  MyChart is used to connect with patients for Virtual Visits (Telemedicine).  Patients are able to view lab/test results, encounter notes, upcoming appointments, etc.  Non-urgent messages can be sent to your provider as well.   To learn more about what you can do with MyChart, go to [email or web address].    Your next appointment:  YOU HAVE BEEN REFERRED NEXT AVAILABLE  EP PROVIDER FOR LOOP RECORDER FOR SYNCOPE   The format for your next appointment:   In Person  Provider:  ANY AVAILABLE EP PROVIDER      Other Instructions  Important Information About Sugar

## 2022-04-23 NOTE — Progress Notes (Signed)
Cardiology Office Note:    Date:  04/23/2022   ID:  Tanika Bracco, DOB Oct 20, 1951, MRN [id number]  PCP:  Elby Showers, MD   [facility] Providers Cardiologist:  Werner Lean, MD     Referring MD: Elby Showers, MD   Follow up after recent episode of sycnope  History of Present Illness:    Nancy Wagner is a 71 y.o. female with a hx of GERD, DMT2, HTN, morbid obesity, RA, OSA who presents to clinic for evaluation of syncope .  Echo 01/22/21 showed EF 60-65%, mild asymmetric left ventricular hypertrophy of the basal-septal segment, G1DD, with mild aortic sclerosis.  Long term monitor showed asymptomatic Mobitz I & II heart block. It was planned to take her off her atenolol but she never did because her BP became elevated.   Today the patient presents to clinic for follow up. About three weeks ago she driving to [place] to visit her sister. She was walking to the bathroom at a rest stop and suddenly found her self on the ground. She hit her ribs and bruised the whole left side of her body. No prodrome whatsoever. She is clear she didn't trip. This has not happened for a long time but used to faint as a child. This has not occurred again.      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   GERD (gastroesophageal reflux disease)    diet control   Gout    12-08-2019  per pt last episode left foot 11/ 2020   Hirsutism    History of obstructive sleep apnea    s/p  UPPP in 1985   Hypertension    followed by pcp  (12-08-2019  per pt had stress test many yrs ago, was told ok)   Lichen simplex chronicus    OA (osteoarthritis)    RA (rheumatoid arthritis) ([place])    12-08-2019  per pt was dx years ago, treated with high level prednisone for few weeks and some other treatment with her eyes and has been in remission since   Type 2 diabetes mellitus  ([place])    followed by pcp ---  (12-08-2019 checks cbg's daily in am,  fasting-- 110 to 126)   Uterine fibroid    Wears glasses     Past Surgical History:  Procedure Laterality Date   [place]  last one 05-09-2018   DILATATION & CURRETTAGE/HYSTEROSCOPY WITH RESECTOCOPE N/A 04/07/2017   Procedure: [place];  Surgeon: Eldred Manges, MD;  Location: [place];  Service: Gynecology;  Laterality: N/A;   ROBOTIC ASSISTED SALPINGO OOPHERECTOMY Bilateral 12/14/2019   Procedure: XI ROBOTIC ASSISTED SALPINGO OOPHORECTOMY;  Surgeon: Delsa Bern, MD;  Location: [facility];  Service: Gynecology;  Laterality: Bilateral;  Bed held, but patient is expected to go home the same day. -ap   THROAT SURGERY  1985   minimal uvulopalatopharyngoplasty for sleep apnea   TUBAL LIGATION Bilateral 1981    Current Medications: Current Meds  Medication Sig   allopurinol (ZYLOPRIM) 100 MG tablet TAKE 1 TABLET BY MOUTH EVERY DAY   Blood Glucose Monitoring Suppl (ACCU-CHEK GUIDE ME) w/Device KIT USE AS DIRECTED   cetirizine (ZYRTEC) 10 MG tablet Take 10 mg by mouth as needed.  Cholecalciferol (VITAMIN D3) 50 MCG (2000 UT) TABS Take 1 tablet by mouth daily.   cloNIDine (CATAPRES) 0.1 MG tablet TAKE 1 TABLET BY MOUTH 2 TIMES DAILY.   cyclobenzaprine (FLEXERIL) 10 MG tablet TAKE ONE HALF TABLET BY MOUTH AT BEDTIME FOR MUSCULOSKELETAL PAIN (Patient taking differently: as needed. TAKE ONE HALF TABLET BY MOUTH AT BEDTIME FOR MUSCULOSKELETAL PAIN)   esomeprazole (NEXIUM) 40 MG capsule TAKE 1 CAPSULE BY MOUTH EVERY DAY   fish oil-omega-3 fatty acids 1000 MG capsule Take 2 g by mouth at bedtime. Reported on 08/14/568   folic acid (FOLVITE) 794 MCG tablet Take 400 mcg by mouth at bedtime.    glipiZIDE (GLUCOTROL XL) 10 MG 24 hr tablet TAKE 1 TABLET (10 MG TOTAL) BY MOUTH DAILY WITH BREAKFAST.    HYDROcodone-acetaminophen (NORCO) 10-325 MG tablet Take 1 tablet by mouth every 8 (eight) hours as needed for up to 5 days.   JANUMET XR [phone] MG TB24 TAKE 1 TABLET BY MOUTH EVERY DAY WITH BREAKFAST   Multiple Vitamins-Minerals (MULTIVITAMIN WOMEN 50+) TABS Take 1 tablet by mouth at bedtime.   olmesartan-hydrochlorothiazide (BENICAR HCT) 40-25 MG tablet TAKE 1 TABLET BY MOUTH EVERY DAY   PROAIR HFA 108 (90 Base) MCG/ACT inhaler TAKE 2 PUFFS BY MOUTH EVERY 6 HOURS AS NEEDED FOR WHEEZE OR SHORTNESS OF BREATH   simvastatin (ZOCOR) 40 MG tablet Take 1 tablet (40 mg total) by mouth at bedtime.   [DISCONTINUED] amLODipine (NORVASC) 5 MG tablet TAKE 1 TABLET BY MOUTH EVERY DAY   [DISCONTINUED] atenolol (TENORMIN) 100 MG tablet TAKE 1 TABLET BY MOUTH EVERY DAY     Allergies:   Adhesive [tape], Codeine, and Other   Social History   Socioeconomic History   Marital status: Married    Spouse name: Not on file   Number of children: 2   Years of education: Not on file   Highest education level: Not on file  Occupational History   Not on file  Tobacco Use   Smoking status: Never   Smokeless tobacco: Never  Vaping Use   Vaping Use: Never used  Substance and Sexual Activity   Alcohol use: Yes    Comment: very rare   Drug use: Never   Sexual activity: Not on file  Other Topics Concern   Not on file  Social History Narrative   Not on file   Social Determinants of Health   Financial Resource Strain: Not on file  Food Insecurity: Not on file  Transportation Needs: Not on file  Physical Activity: Not on file  Stress: Not on file  Social Connections: Not on file     Family History: The patient's family history includes Heart disease in her sister.  ROS:   Please see the history of present illness.     All other systems reviewed and are negative.  EKGs/Labs/Other Studies Reviewed:    The following studies were reviewed today:  Cardiac Event Monitoring: Date:  02/12/21 Results: Patient had a minimum heart rate of 49 bpm, maximum heart rate of 150 bpm, and average heart rate of 66 bpm through two devices. Computer algorithm reads heart rates of 29 and 37 bpm; this is based on the dropped beat of a Mobitz Type I heart block. Predominant underlying rhythm was sinus rhythm. One run of non-sustained ventricular tachycardia occurred lasting 4 beats at longest with a max rate of 150 bpm at fastest. Isolated PACs were rare (<1.0%), with rare couplets or triplets present. Isolated PVCs were rare (<1.0%),  with rare couplets present. There is Mobitz Type I heart block and nocturnal Mobitz Type II heart block (01/13/21 4:28 AM). No triggered and diary events.   Transthoracic Echocardiogram: Date: 01/22/21 Results: No evidence of significant aortic dilation when indexed for height  1. Left ventricular ejection fraction, by estimation, is 60 to 65%. The  left ventricle has normal function. The left ventricle has no regional  wall motion abnormalities. There is mild asymmetric left ventricular  hypertrophy of the basal-septal segment.  Left ventricular diastolic parameters are consistent with Grade I  diastolic dysfunction (impaired relaxation). Elevated left atrial  pressure. The E/e' is 17.   2. Right ventricular systolic function is normal. The right ventricular  size is normal. There is normal pulmonary artery systolic pressure.   3. The mitral valve is normal in structure. Trivial mitral valve  regurgitation.   4. The aortic valve is tricuspid. There is mild calcification of the  aortic valve. There is mild thickening of the aortic valve. Aortic valve  regurgitation is not visualized. Mild aortic valve sclerosis is present,  with no evidence of aortic valve  stenosis.   5. Aortic dilatation noted. 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   6. The inferior vena cava is normal in size with greater than 50%  respiratory  variability, suggesting right atrial pressure of 3 mmHg.   EKG:  EKG is  ordered today.  The ekg ordered today demonstrates sinus with sinus arrhythmia HR 62  Recent Labs: 05/29/2021: BUN 15; Creat 1.11; Hemoglobin 12.1; Platelets 236; Potassium 3.9; Sodium 138; TSH 2.48 12/08/2021: ALT 13  Recent Lipid Panel    Component Value Date/Time   CHOL 169 12/08/2021 0920   TRIG 186 (H) 12/08/2021 0920   HDL 62 12/08/2021 0920   CHOLHDL 2.7 12/08/2021 0920   VLDL 18 02/08/2017 1121   LDLCALC 79 12/08/2021 0920     Risk Assessment/Calculations:           Physical Exam:    VS:  BP 118/66 (BP Location: Right Arm, Patient Position: Sitting, Cuff Size: Normal)   Pulse 62   Ht 5' 2.5" (1.588 m)   Wt 193 lb (87.5 kg)   BMI 34.74 kg/m     Wt Readings from Last 3 Encounters:  04/23/22 193 lb (87.5 kg)  04/20/22 194 lb (88 kg)  03/12/22 198 lb (89.8 kg)     GEN:  Well nourished, well developed in no acute distress HEENT: Normal NECK: No JVD; No carotid bruits LYMPHATICS: No lymphadenopathy CARDIAC: RRR, soft systolic murmur. No rubs, gallops RESPIRATORY:  Clear to auscultation without rales, wheezing or rhonchi  ABDOMEN: Soft, non-tender, non-distended MUSCULOSKELETAL:  No edema; No deformity  SKIN: Warm and dry NEUROLOGIC:  Alert and oriented x 3 PSYCHIATRIC:  Normal affect   ASSESSMENT:    1. Syncope and collapse   2. Mobitz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   3. Mixed hyperlipidemia   4. Obesity, diabetes, and hypertension syndrome ([place])    PLAN:    In order of problems listed above:  Syncope: very suspicious for cardiac syncope given lack of prodrome and significant injury to body. Given history of heart block on a previous monitor and syncope, will plan to refer her for a Loop recorder. I worry that another heart monitor may miss a subsequent event. I have asked her to stop her atenolol 157m daily.  Mobitz Type I and Type II nocturnal HB: see above. ECG today with  normal rhythm and no conduction disturbance.   HTN: BP well controlled today. Since we are stopping atenolol 132m daily. Will plan to increase Norvasc from 5--> 144mdaily. Continue clonidine 0.73m5mID and Benicar HCT 40-44m85mhe will call us iKoreaher BP becomes elevated.   Obesity: Body mass index is 34.74 kg/m. Diet and exercise.     Medication Adjustments/Labs and Tests Ordered: Current medicines are reviewed at length with the patient today.  Concerns regarding medicines are outlined above.  Orders Placed This Encounter  Procedures   Ambulatory referral to Cardiac Electrophysiology   EKG 12-Lead   Meds ordered this encounter  Medications   amLODipine (NORVASC) 10 MG tablet    Sig: Take 1 tablet (10 mg total) by mouth daily.    Dispense:  90 tablet    Refill:  2    Patient Instructions  Medication Instructions:   START TAKING: AMLODIPINE  10 MG ONCE A DAY   STOP TAKING AND REMOVE THIS MEDICATION FROM YOUR MEDICATION LIST: ATENOLOL    *If you need a refill on your cardiac medications before your next appointment, please call your pharmacy*   Lab Work: NONE ORDERED  TODAY    If you have labs (blood work) drawn today and your tests are completely normal, you will receive your results only by: [place] you have MyChart) OR A paper copy in the mail If you have any lab test that is abnormal or we need to change your treatment, we will call you to review the results.   Testing/Procedures: NONE ORDERED  TODAY    Follow-Up: [facility] and your [facility] needs are our priority.  As part of our continuing mission to provide you with exceptional heart care, we have created designated Provider Care Teams.  These Care Teams include your primary Cardiologist (physician) and Advanced Practice Providers (APPs -  Physician Assistants and Nurse Practitioners) who all work together to provide you with the care you need, when you need it.  We recommend signing up  for the patient portal called "MyChart".  Sign up information is provided on this After Visit Summary.  MyChart is used to connect with patients for Virtual Visits (Telemedicine).  Patients are able to view lab/test results, encounter notes, upcoming appointments, etc.  Non-urgent messages can be sent to your provider as well.   To learn more about what you can do with MyChart, go to [email or web address] Your next appointment:  YOU HAVE BEEN REFERRED NEXT AVAILABLE  EP PROVIDER FOR LOOP RECORDER FOR SYNCOPE   The format for your next appointment:   In Person  Provider:  ANY AVAILABLE EP PROVIDER      Other Instructions  Important Information About Sugar         Signed, KathAngelena Form-C  04/23/2022 2:57 PM    [facility]

## 2022-05-08 ENCOUNTER — Telehealth: Payer: Self-pay | Admitting: Internal Medicine

## 2022-05-08 NOTE — Telephone Encounter (Signed)
New message   Pt c/o BP issue: STAT if pt c/o blurred vision, one-sided weakness or slurred speech  1. What are your last 5 BP readings? 212/73-yesterday  2. Are you having any other symptoms (ex. Dizziness, headache, blurred vision, passed out)? Pt states she knew her BP was going up because her neck and ears were hot. She said she felt faint  3. What is your BP issue? Pt states her BP medication was changed and was told to inform the office if her BP went up. Pt states she doesn't know what her BP is this morning but she knows its down from what it was yesterday.

## 2022-05-08 NOTE — Telephone Encounter (Signed)
Called pt voicemail not set up unable to leave a voice message.

## 2022-05-11 ENCOUNTER — Telehealth: Payer: Self-pay | Admitting: Internal Medicine

## 2022-05-11 NOTE — Telephone Encounter (Signed)
[name] [phone]  Nancy Wagner is calling to say she is still hurting on the inside. She stated that when she turns over at nite she really hurts in her back and sides. She would like to come in to discuss.

## 2022-05-12 NOTE — Telephone Encounter (Signed)
scheduled

## 2022-05-13 ENCOUNTER — Ambulatory Visit: Payer: Medicare PPO | Admitting: Internal Medicine

## 2022-05-13 ENCOUNTER — Encounter: Payer: Self-pay | Admitting: Internal Medicine

## 2022-05-13 VITALS — BP 138/78 | Temp 97.6°F | Resp 15 | Ht 62.5 in | Wt 183.4 lb

## 2022-05-13 DIAGNOSIS — R1084 Generalized abdominal pain: Secondary | ICD-10-CM

## 2022-05-13 DIAGNOSIS — K862 Cyst of pancreas: Secondary | ICD-10-CM

## 2022-05-13 DIAGNOSIS — R55 Syncope and collapse: Secondary | ICD-10-CM | POA: Diagnosis not present

## 2022-05-13 DIAGNOSIS — K769 Liver disease, unspecified: Secondary | ICD-10-CM

## 2022-05-13 DIAGNOSIS — R634 Abnormal weight loss: Secondary | ICD-10-CM | POA: Diagnosis not present

## 2022-05-13 DIAGNOSIS — M546 Pain in thoracic spine: Secondary | ICD-10-CM

## 2022-05-13 MED ORDER — METHYLPREDNISOLONE 4 MG PO TABS: ORAL_TABLET | ORAL | 0 refills | Status: DC

## 2022-05-13 NOTE — Patient Instructions (Addendum)
If symptoms persist, we can refer her back to Dr. Ardis Hughs for evaluation.  A loop recorder has been ordered by cardiology due to syncope.  We offered physical therapy and she declined.    We offered pain medication and she declined.  We offered physical therapy and patient declined.    We are going to try a short course of steroids for presumed musculoskeletal pain but also MRI has been ordered of the abdomen at patient request.  Take Medrol Dosepak in tapering course as directed to see if pain improves.

## 2022-05-13 NOTE — Progress Notes (Signed)
Subjective:    Patient ID: Nancy Wagner, female    DOB: June 28, 1951, 71 y.o.   MRN: [id number]  HPI 71 year old Female seen for abdominal pain and soreness is well and as for musculoskeletal pain and abdominal wall area.  She says she has had 2 falls fairly recently.  1 fall was associated with syncope which occurred in early May while [place].  She was about 30 miles from [place].  She stopped at a rest area to use the restroom.  She felt fine getting out of the car and subsequently collapsed on the sidewalk and had no warning about impending syncope.  No seizure activity was reported by bystanders.  She came to without medical assistance.  She had some minor abrasions.  She got back in her car and drove on to [facility] and did okay.  Denied chest pain or shortness of breath.  Has had persistent musculoskeletal pain ever since and now is quite concerned.  This pain seems to be in her parathoracic and lumbar spine areas as well as generalized abdominal tenderness.  She was seen by Cardiology in May.  EKG showed sinus rhythm and sinus arrhythmia.  Patient was referred for a loop recorder.  Amlodipine was increased from 5 to 10 mg daily and atenolol was discontinued.  She was to continue with clonidine and Benicar HCTZ 40/25 daily.  She has a history of Mobitz 1 heart block and nonsustained V. tach lasting 4 beats based on cardiac event monitoring March 2022.  Also noted to have nocturnal Mobitz 2 heart block at that time as well and some isolated PACs and PVCs.  On May 22 she had thoracic spine, lumbar spine and left rib films all of which were negative for fractures.  She apparently had a second fall trying to help her sister who was suffering from scleroderma and hemophilia.  Patient spent some amount of time with her sister at [facility].  Patient subsequently lost her sister to these illnesses.  I believe this discomfort could be  musculoskeletal pain that never resolved from these 2 falls.  I have discussed physical therapy with her and she declines at this time.  She thinks something is internally wrong particularly in her abdomen although she has no issues eating and no issues with bowel movements.  No blood in stool.  Has lost 16 pounds  Review of Systems patient denies any change in bowel habits.  She had colonoscopy by Dr. Ardis Hughs in 2019.  Small internal hemorrhoids were noted.  Otherwise study was normal and follow-up was recommended in 10 years. Has lost 16 pounds since January 2023.     Objective:   Physical Exam   Temperature 97.6 degrees blood pressure 138/78 respiratory rate 15 weight 183 pounds 6.4 ounces BMI 33.01  Skin: Warm and dry.  Chest is clear.  Cardiac exam regular rate and rhythm.  No ectopy present today.  No lower extremity pitting edema.  Abdomen is obese soft nondistended without hepatosplenomegaly masses or tenderness.  She has bilateral tenderness along her rib cage area without discrete focal point of pain.  X-rays obtained on May 22 included thoracic spine films lumbar spine films and rib films and no fractures were identified.      Assessment & Plan:  Abdominal pain-etiology unclear status post episode of syncope in May.  Patient is concerned about this and would like further evaluation.  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  Last had MRI February 2022.  Gallbladder is surgically absent.  She also has a 6 mm simple cyst lesion on her pancreas noted on MRI February 2022.  I have ordered another MRI because of generalized abdominal pain.  I have offered physical therapy but patient has declined.  Offered pain medication and patient has declined that as well.  History of unprovoked syncope at rest stop in [place] in May.  Loop recorder has been ordered by cardiology  Plan: We will proceed with MRI of abdomen with and without contrast.  Also  previously in April a mammogram was ordered for her but has not been done.

## 2022-05-19 ENCOUNTER — Other Ambulatory Visit: Payer: Self-pay

## 2022-05-19 DIAGNOSIS — R1084 Generalized abdominal pain: Secondary | ICD-10-CM

## 2022-05-19 DIAGNOSIS — K862 Cyst of pancreas: Secondary | ICD-10-CM

## 2022-05-19 DIAGNOSIS — K769 Liver disease, unspecified: Secondary | ICD-10-CM

## 2022-05-19 DIAGNOSIS — R55 Syncope and collapse: Secondary | ICD-10-CM

## 2022-05-22 ENCOUNTER — Ambulatory Visit (HOSPITAL_COMMUNITY)
Admission: RE | Admit: 2022-05-22 | Discharge: 2022-05-22 | Disposition: A | Discharge status: Home / Self Care | Payer: Medicare PPO | Source: Ambulatory Visit | Attending: Internal Medicine | Admitting: Internal Medicine

## 2022-05-22 DIAGNOSIS — H25013 Cortical age-related cataract, bilateral: Secondary | ICD-10-CM | POA: Diagnosis not present

## 2022-05-22 DIAGNOSIS — K862 Cyst of pancreas: Secondary | ICD-10-CM | POA: Insufficient documentation

## 2022-05-22 DIAGNOSIS — H524 Presbyopia: Secondary | ICD-10-CM | POA: Diagnosis not present

## 2022-05-22 DIAGNOSIS — R55 Syncope and collapse: Secondary | ICD-10-CM | POA: Insufficient documentation

## 2022-05-22 DIAGNOSIS — R1084 Generalized abdominal pain: Secondary | ICD-10-CM | POA: Insufficient documentation

## 2022-05-22 DIAGNOSIS — K769 Liver disease, unspecified: Secondary | ICD-10-CM | POA: Diagnosis not present

## 2022-05-22 DIAGNOSIS — H2513 Age-related nuclear cataract, bilateral: Secondary | ICD-10-CM | POA: Diagnosis not present

## 2022-05-22 DIAGNOSIS — N281 Cyst of kidney, acquired: Secondary | ICD-10-CM | POA: Diagnosis not present

## 2022-05-22 DIAGNOSIS — E119 Type 2 diabetes mellitus without complications: Secondary | ICD-10-CM | POA: Diagnosis not present

## 2022-05-22 LAB — POCT I-STAT CREATININE: Creatinine, Ser: 1.5 mg/dL — ABNORMAL HIGH (ref 0.44–1.00)

## 2022-05-22 LAB — HM DIABETES EYE EXAM

## 2022-05-22 MED ORDER — IOHEXOL 300 MG/ML  SOLN
100.0000 mL | Freq: Once | INTRAMUSCULAR | Status: AC | PRN
Start: 2022-05-22 — End: 2022-05-22
  Administered 2022-05-22: 100 mL via INTRAVENOUS

## 2022-05-24 IMAGING — MR MR ABDOMEN WO/W CM
12 of 19 series · 27 of 48 positions shown · IV contrast (18 ML MULTIHANCE)
Comparison: 01/10/2019

CLINICAL DATA: Liver and pancreatic lesions.

EXAM:
MRI ABDOMEN WITHOUT AND WITH CONTRAST
TECHNIQUE: Multiplanar multisequence MR imaging of the abdomen was performed
both before and after the administration of intravenous contrast.
CONTRAST:  18mL MULTIHANCE GADOBENATE DIMEGLUMINE 529 MG/ML IV SOLN

[Series 3: cor haste · coronal · 5.0mm · 0.74mm/px · 2 of 32 slices shown]
[im 1/32]
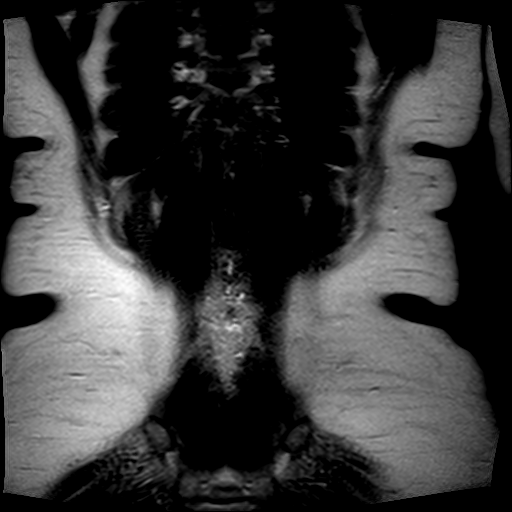
[im 32/32]
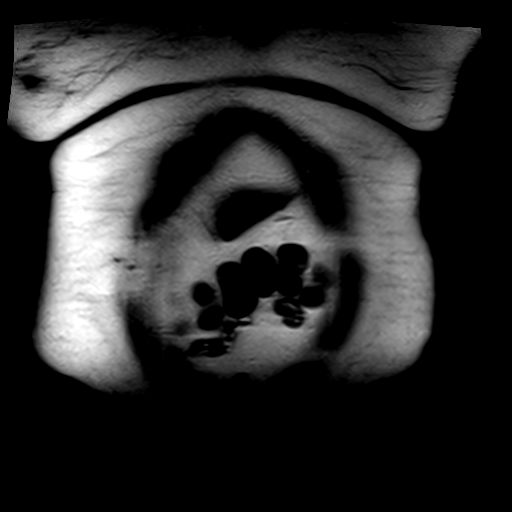

[Series 4: bSSFP · coronal · 5.0mm · 0.78mm/px · 2 of 31 slices shown (1 of 2)]
[im 1/31]
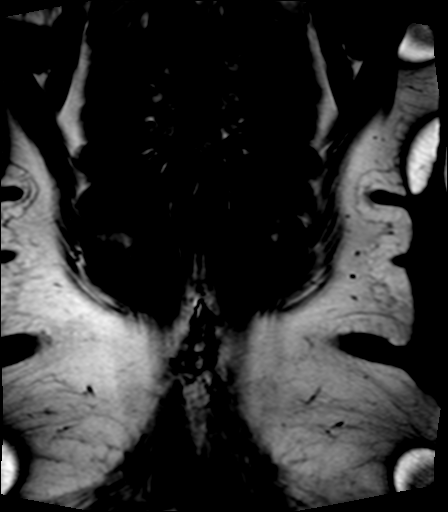
[im 31/31]
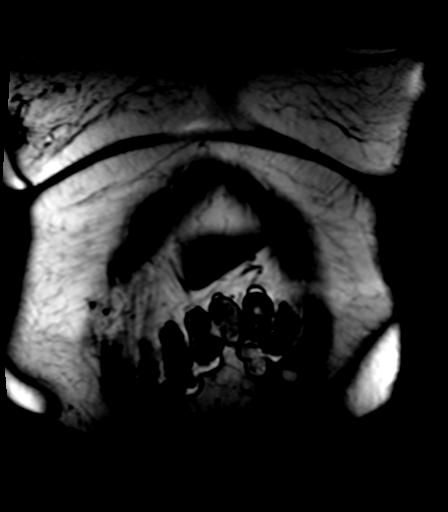

[Series 5: T2 · coronal · 3.0mm · 0.70mm/px · 2 of 55 slices shown (1 of 2)]
[im 1/55]
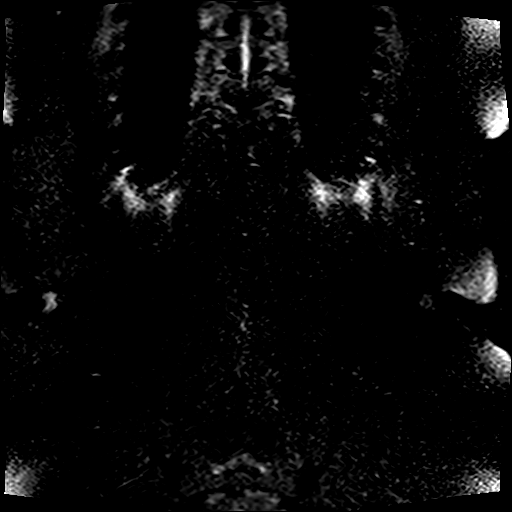
[im 55/55]
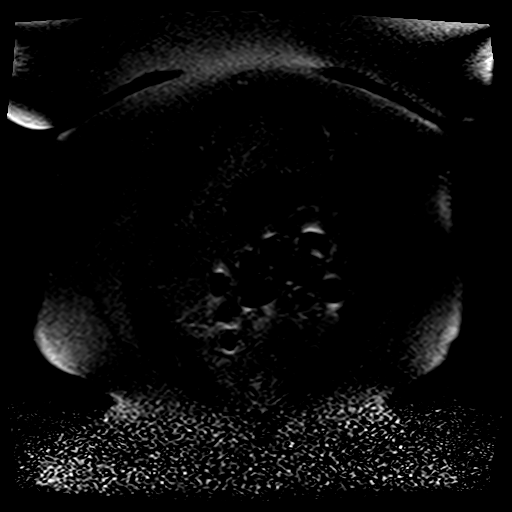

[Series 6: T1 · axial · 6.0mm · 0.74mm/px · z∈[-173,+65]mm · 3 of 74 slices shown]
[im 1/74]
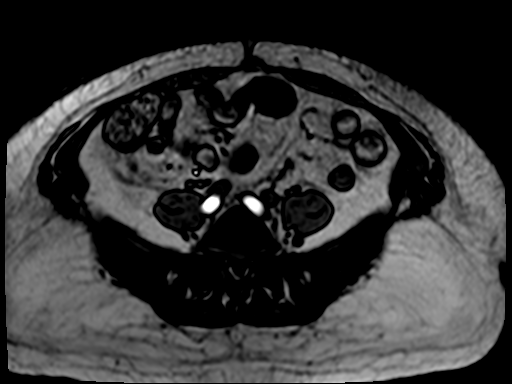
[im 37/74]
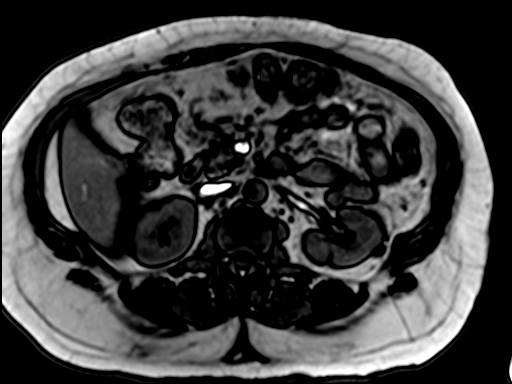
[im 74/74]
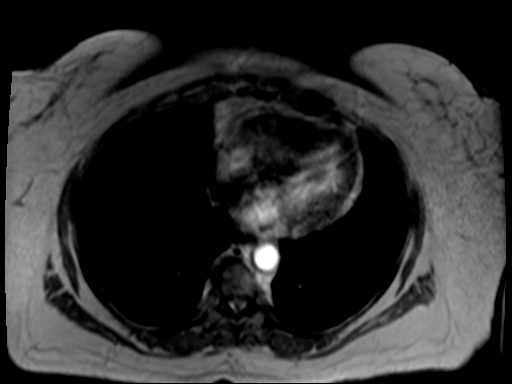

[Series 7: axial haste · axial · 6.0mm · 0.74mm/px · 1 of 37 slices shown]
[im 1/37]
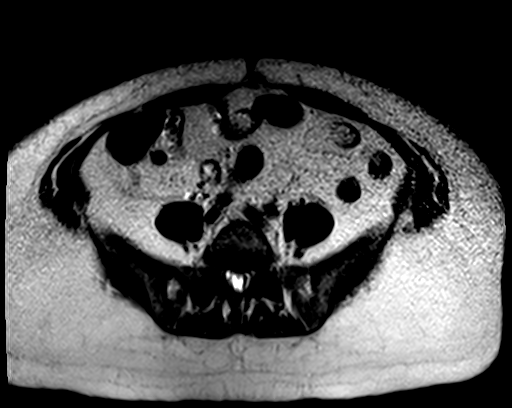

[Series 8: T2 · axial · 6.0mm · 1.12mm/px · 1 of 36 slices shown (2 of 2)]
[im 1/36]
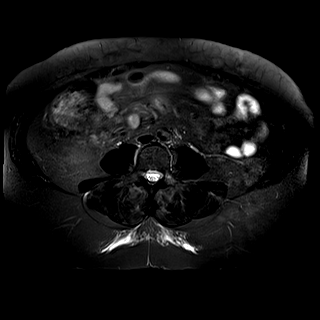

[Series 13: ep2d_diff_b50_500_800_p2_trig · axial · 6.0mm · 1.98mm/px · z∈[-140,+112]mm · 4 of 108 slices shown]
[im 1/108]
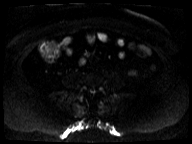
[im 36/108]
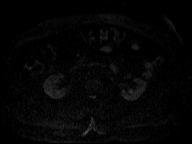
[im 72/108]
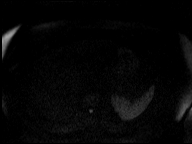
[im 108/108]
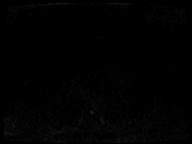

[Series 14: ep2d_diff_b50_500_800_p2_trig_adc · axial · 6.0mm · 1.98mm/px · 1 of 36 slices shown]
[im 1/36]
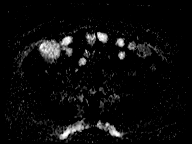

[Series 15: bSSFP · axial · 4.0mm · 0.68mm/px · z∈[-174,+66]mm · 2 of 61 slices shown (2 of 2)]
[im 1/61]
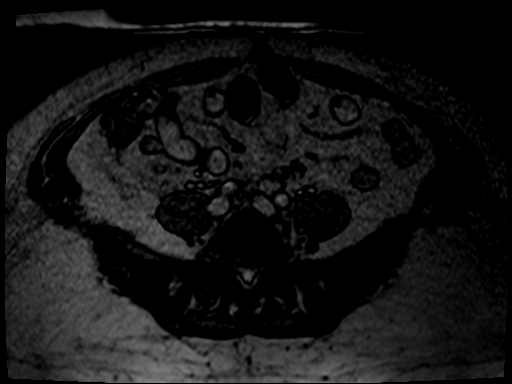
[im 61/61]
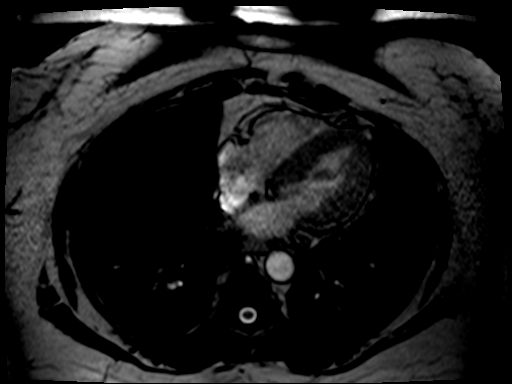

[Series 16: T1 dynamic · axial · non-contrast · 2.5mm · 0.74mm/px · z∈[-155,+83]mm · 3 of 96 slices shown]
[im 1/96]
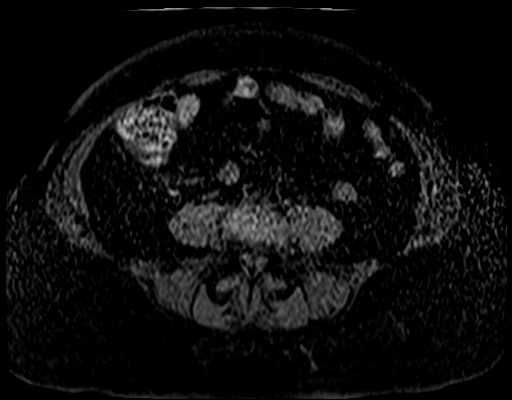
[im 48/96]
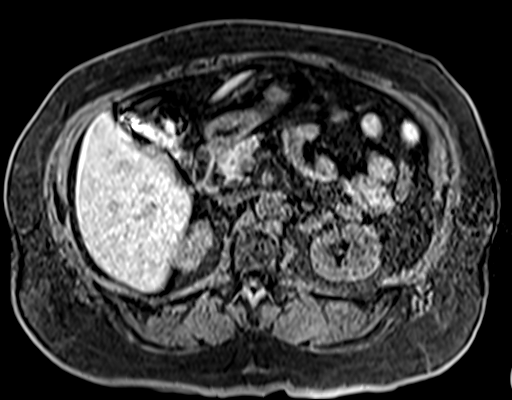
[im 96/96]
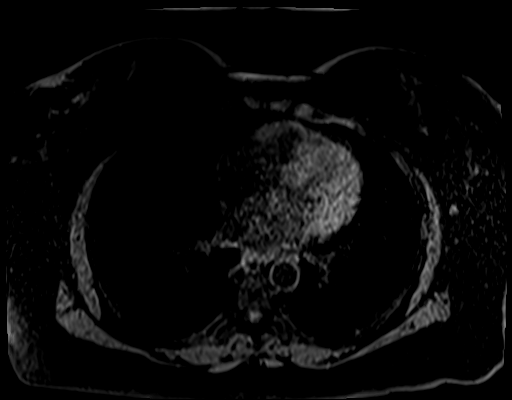

[Series 17: T1 dynamic post-contrast · axial · 2.5mm · 0.74mm/px · z∈[-155,+83]mm · 3 of 96 slices shown (1 of 2)]
[im 1/96]
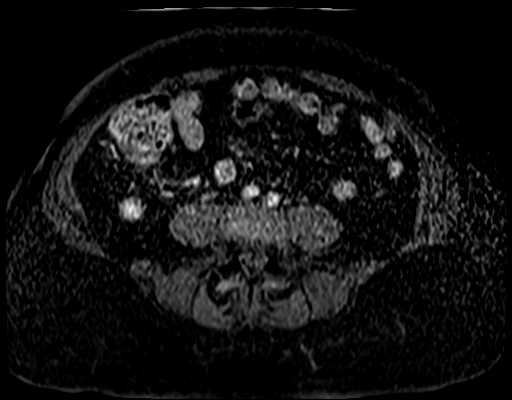
[im 48/96]
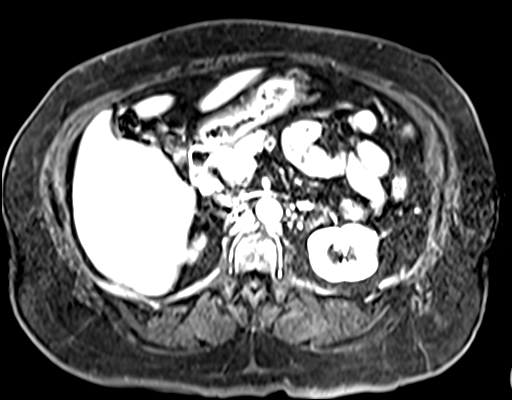
[im 96/96]
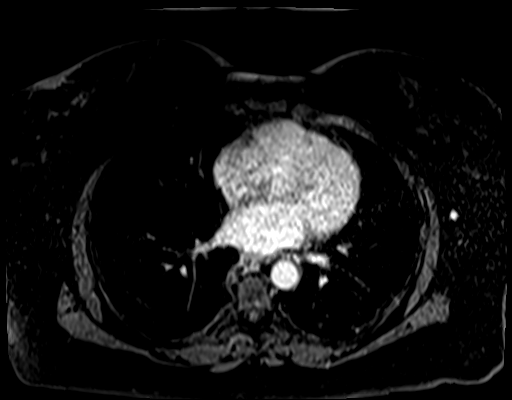

[Series 18: T1 dynamic post-contrast · axial · 2.5mm · 0.74mm/px · z∈[-155,+83]mm · 3 of 96 slices shown (2 of 2)]
[im 1/96]
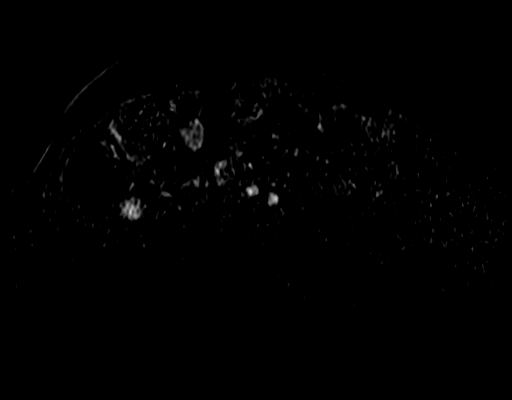
[im 48/96]
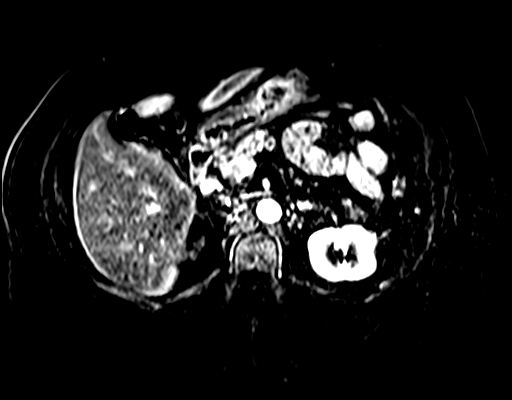
[im 96/96]
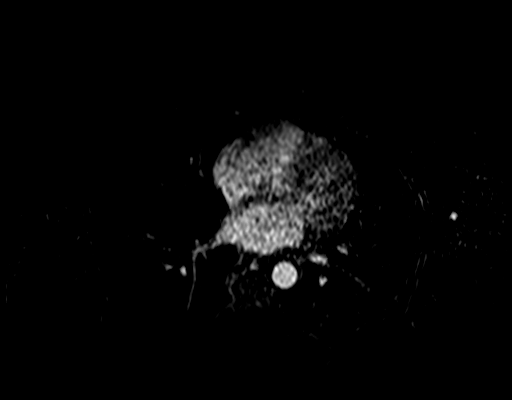

[27 of 48 positions shown; findings below may reference images not displayed]

FINDINGS: Lower chest: Unremarkable.

Hepatobiliary: The 8 mm subcapsular hypervascular lesion identified
in the posterior a padded dome on delayed imaging previously is
stable in the interval (see 3 minutes delayed image 24 series 24).
This lesion is not visible on precontrast T1 or T2 weighted imaging.
Gallbladder surgically absent. No intrahepatic or extrahepatic
biliary dilation.

Pancreas: 6 mm simple cystic lesion is stable in the interval.
Identified in the tail of pancreas previously no main duct
dilatation.

Spleen:  No splenomegaly. No focal mass lesion.

Adrenals/Urinary Tract: No adrenal nodule or mass. Right kidney
unremarkable. 16 mm simple cyst noted left kidney.

Stomach/Bowel: Stomach is unremarkable. No gastric wall thickening.
No evidence of outlet obstruction. Duodenum is normally positioned
as is the ligament of Treitz. No small bowel or colonic dilatation
within the visualized abdomen.

Vascular/Lymphatic: There is abdominal aortic atherosclerosis
without aneurysm. There is no gastrohepatic or hepatoduodenal
ligament lymphadenopathy. No retroperitoneal or mesenteric
lymphadenopathy.

Other:  No intraperitoneal free fluid.

Musculoskeletal: No focal suspicious marrow enhancement within the
visualized bony anatomy.
IMPRESSION: 1. Stable 8 mm subcapsular hypervascular lesion in the posterior
liver dome. As before, this lesion is really only visible on delayed
postcontrast imaging. Although the finding remains indeterminate,
interval stability is reassuring and this is almost assuredly a
benign finding.
2. 6 mm simple cystic lesion in the tail of pancreas is stable in
the interval. Follow-up MRI in 2 years recommended. This
recommendation follows ACR consensus guidelines: Management of
Incidental pancreatic Cysts: A White Paper of the ACR Incidental
Findings Committee. [HOSPITAL] 7457;[DATE].

## 2022-05-25 ENCOUNTER — Ambulatory Visit: Payer: Medicare PPO | Admitting: Internal Medicine

## 2022-05-25 ENCOUNTER — Encounter: Payer: Self-pay | Admitting: Internal Medicine

## 2022-05-25 VITALS — BP 120/60 | HR 86 | Temp 98.1°F

## 2022-05-25 DIAGNOSIS — I10 Essential (primary) hypertension: Secondary | ICD-10-CM

## 2022-05-25 DIAGNOSIS — R55 Syncope and collapse: Secondary | ICD-10-CM | POA: Diagnosis not present

## 2022-05-25 DIAGNOSIS — E785 Hyperlipidemia, unspecified: Secondary | ICD-10-CM | POA: Diagnosis not present

## 2022-05-25 DIAGNOSIS — E1169 Type 2 diabetes mellitus with other specified complication: Secondary | ICD-10-CM | POA: Diagnosis not present

## 2022-05-25 DIAGNOSIS — R0781 Pleurodynia: Secondary | ICD-10-CM | POA: Diagnosis not present

## 2022-06-01 ENCOUNTER — Other Ambulatory Visit: Payer: Medicare PPO

## 2022-06-09 ENCOUNTER — Other Ambulatory Visit: Payer: Self-pay | Admitting: Internal Medicine

## 2022-06-15 ENCOUNTER — Other Ambulatory Visit: Payer: Medicare PPO

## 2022-06-15 DIAGNOSIS — R5383 Other fatigue: Secondary | ICD-10-CM | POA: Diagnosis not present

## 2022-06-15 DIAGNOSIS — E785 Hyperlipidemia, unspecified: Secondary | ICD-10-CM | POA: Diagnosis not present

## 2022-06-15 DIAGNOSIS — E1159 Type 2 diabetes mellitus with other circulatory complications: Secondary | ICD-10-CM

## 2022-06-15 DIAGNOSIS — E1169 Type 2 diabetes mellitus with other specified complication: Secondary | ICD-10-CM

## 2022-06-15 DIAGNOSIS — I152 Hypertension secondary to endocrine disorders: Secondary | ICD-10-CM | POA: Diagnosis not present

## 2022-06-15 DIAGNOSIS — E669 Obesity, unspecified: Secondary | ICD-10-CM | POA: Diagnosis not present

## 2022-06-16 LAB — CBC WITH DIFFERENTIAL/PLATELET
Absolute Monocytes: 527 cells/uL (ref 200–950)
Basophils Absolute: 50 cells/uL (ref 0–200)
Basophils Relative: 0.8 %
Eosinophils Absolute: 260 cells/uL (ref 15–500)
Eosinophils Relative: 4.2 %
HCT: 34.5 % — ABNORMAL LOW (ref 35.0–45.0)
Hemoglobin: 11.4 g/dL — ABNORMAL LOW (ref 11.7–15.5)
Lymphs Abs: 2393 cells/uL (ref 850–3900)
MCH: 30.1 pg (ref 27.0–33.0)
MCHC: 33 g/dL (ref 32.0–36.0)
MCV: 91 fL (ref 80.0–100.0)
MPV: 12 fL (ref 7.5–12.5)
Monocytes Relative: 8.5 %
Neutro Abs: 2970 cells/uL (ref 1500–7800)
Neutrophils Relative %: 47.9 %
Platelets: 269 10*3/uL (ref 140–400)
RBC: 3.79 10*6/uL — ABNORMAL LOW (ref 3.80–5.10)
RDW: 13.4 % (ref 11.0–15.0)
Total Lymphocyte: 38.6 %
WBC: 6.2 10*3/uL (ref 3.8–10.8)

## 2022-06-16 LAB — HEMOGLOBIN A1C
Hgb A1c MFr Bld: 7 % of total Hgb — ABNORMAL HIGH (ref <5.7)
Mean Plasma Glucose: 154 mg/dL
eAG (mmol/L): 8.5 mmol/L

## 2022-06-16 LAB — COMPLETE METABOLIC PANEL WITH GFR
AG Ratio: 1.1 (calc) (ref 1.0–2.5)
ALT: 22 U/L (ref 6–29)
AST: 23 U/L (ref 10–35)
Albumin: 4.1 g/dL (ref 3.6–5.1)
Alkaline phosphatase (APISO): 49 U/L (ref 37–153)
BUN/Creatinine Ratio: 17 (calc) (ref 6–22)
BUN: 19 mg/dL (ref 7–25)
CO2: 28 mmol/L (ref 20–32)
Calcium: 9.7 mg/dL (ref 8.6–10.4)
Chloride: 103 mmol/L (ref 98–110)
Creat: 1.13 mg/dL — ABNORMAL HIGH (ref 0.60–1.00)
Globulin: 3.8 g/dL (calc) — ABNORMAL HIGH (ref 1.9–3.7)
Glucose, Bld: 132 mg/dL — ABNORMAL HIGH (ref 65–99)
Potassium: 4.5 mmol/L (ref 3.5–5.3)
Sodium: 143 mmol/L (ref 135–146)
Total Bilirubin: 0.4 mg/dL (ref 0.2–1.2)
Total Protein: 7.9 g/dL (ref 6.1–8.1)
eGFR: 52 mL/min/{1.73_m2} — ABNORMAL LOW (ref >=60)

## 2022-06-16 LAB — LIPID PANEL
Cholesterol: 151 mg/dL (ref <200)
HDL: 59 mg/dL (ref >=50)
LDL Cholesterol (Calc): 74 mg/dL (calc)
Non-HDL Cholesterol (Calc): 92 mg/dL (calc) (ref <130)
Total CHOL/HDL Ratio: 2.6 (calc) (ref <5.0)
Triglycerides: 92 mg/dL (ref <150)

## 2022-06-16 LAB — TSH: TSH: 1.3 mIU/L (ref 0.40–4.50)

## 2022-06-16 LAB — MICROALBUMIN, URINE: Microalb, Ur: 1.5 mg/dL

## 2022-06-18 ENCOUNTER — Encounter: Payer: Self-pay | Admitting: Internal Medicine

## 2022-06-18 ENCOUNTER — Ambulatory Visit (INDEPENDENT_AMBULATORY_CARE_PROVIDER_SITE_OTHER): Payer: Medicare PPO | Admitting: Internal Medicine

## 2022-06-18 VITALS — BP 118/66 | HR 80 | Temp 98.1°F | Ht 63.0 in | Wt 192.5 lb

## 2022-06-18 DIAGNOSIS — Z0000 Encounter for general adult medical examination without abnormal findings: Secondary | ICD-10-CM

## 2022-06-18 DIAGNOSIS — M1712 Unilateral primary osteoarthritis, left knee: Secondary | ICD-10-CM

## 2022-06-18 DIAGNOSIS — E1169 Type 2 diabetes mellitus with other specified complication: Secondary | ICD-10-CM | POA: Diagnosis not present

## 2022-06-18 DIAGNOSIS — D890 Polyclonal hypergammaglobulinemia: Secondary | ICD-10-CM | POA: Diagnosis not present

## 2022-06-18 DIAGNOSIS — Z87898 Personal history of other specified conditions: Secondary | ICD-10-CM

## 2022-06-18 DIAGNOSIS — I441 Atrioventricular block, second degree: Secondary | ICD-10-CM

## 2022-06-18 DIAGNOSIS — G4730 Sleep apnea, unspecified: Secondary | ICD-10-CM

## 2022-06-18 DIAGNOSIS — Z6834 Body mass index (BMI) 34.0-34.9, adult: Secondary | ICD-10-CM | POA: Diagnosis not present

## 2022-06-18 DIAGNOSIS — K769 Liver disease, unspecified: Secondary | ICD-10-CM

## 2022-06-18 DIAGNOSIS — I10 Essential (primary) hypertension: Secondary | ICD-10-CM | POA: Diagnosis not present

## 2022-06-18 DIAGNOSIS — E785 Hyperlipidemia, unspecified: Secondary | ICD-10-CM

## 2022-06-18 DIAGNOSIS — Z8739 Personal history of other diseases of the musculoskeletal system and connective tissue: Secondary | ICD-10-CM | POA: Diagnosis not present

## 2022-06-18 DIAGNOSIS — M546 Pain in thoracic spine: Secondary | ICD-10-CM | POA: Diagnosis not present

## 2022-06-18 LAB — POCT URINALYSIS DIPSTICK
Bilirubin, UA: NEGATIVE
Blood, UA: NEGATIVE
Glucose, UA: NEGATIVE
Ketones, UA: NEGATIVE
Leukocytes, UA: NEGATIVE
Nitrite, UA: NEGATIVE
Protein, UA: NEGATIVE
Spec Grav, UA: 1.02 (ref 1.010–1.025)
Urobilinogen, UA: 0.2 E.U./dL
pH, UA: 5 (ref 5.0–8.0)

## 2022-06-18 MED ORDER — METHYLPREDNISOLONE 4 MG PO TABS: ORAL_TABLET | ORAL | 0 refills | Status: DC

## 2022-06-18 NOTE — Progress Notes (Signed)
Annual Wellness Visit     Patient: Nancy Wagner, Female    DOB: 17-Oct-1951, 71 y.o.   MRN: [id number] Visit Date: 06/18/2022  Chief Complaint  Patient presents with   Medicare Wellness   Subjective    Nancy Wagner is a 72 y.o. female who presents today for her Annual Wellness Visit.  HPI She is also here for health maintenance exam and evaluation of medical issues.  Declines Shingrix vaccine and pneumococcal 20 vaccines. Mamogram done 2022.  Mammogram needs to be done annually.  History of type 2 diabetes mellitus.  Hemoglobin A1c 7%.  She takes glipizide XL 10 mg daily and Janumet XR [phone] daily  History of palpitations seen by cardiology in 2022  History of COVID-18 December 2020  Patient reports history of rheumatoid arthritis diagnosed a number of years ago treated with prednisone and reportedly in remission until recently  History of obstructive sleep apnea  GE reflux  History of gout in 2021  History of hypertension  History of elevated testosterone level in female seen by GYN  History of lichen simplex chronicus  History of osteoarthritis  Patient had an episode of syncope while driving to [place] in early May 2023.  She stopped at a rest area to use the restroom.  She was fine getting out of the car but collapsed on the sidewalk.  She had no warning about her impending syncope.  No seizure activity reported by bystanders she got back in her car and drove on to [facility] and did okay.  She has complained of some generalized abdominal pain and had CT of the abdomen in June 2023 which was unremarkable.  She has a history of cholecystectomy and small hiatal hernia.  She has an appointment with electrophysiologist July 24  Social history: She is married.  Retired Investment banker, corporate at [employer].  She was referred by Nancy Wagner.  Married over 50 years.  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  2 adult children a son and a daughter.  She does not smoke or consume alcohol.  Patient attended [facility] [place].  Family history: 1 brother died of complications of cancer.  1 sister with history of MI.  Multiple siblings.  Parents in their late 59s.  Patient had 2 C-sections in 1975 in 1978.  Throat surgery by Dr. Ernesto Wagner in 1982.  Pneumonia in 1977.    Review of Systems no new complaints   Objective    Vitals:  Physical Exam BP 118/66 pulse 80, regular, temperature 98.1 degrees pulse oximetry 97% weight 192 pounds 8 ounces BMI 34.10  Skin: Warm and dry.  No cervical adenopathy, thyromegaly or carotid bruits.  Chest is clear to auscultation.  Cardiac exam: Regular rate and rhythm without ectopy.  Breasts are without masses.  Cardiac exam: Regular rate and rhythm without murmur or ectopy.  Abdomen soft nondistended without hepatosplenomegaly masses or tenderness.  No lower extremity pitting edema.  Neuro is intact without gross focal deficits.  Affect thought and judgment appear to be normal.     Most recent functional status assessment:    06/18/2022   11:09 AM  In your present state of health, do you have any difficulty performing the following activities:  Hearing? 0  Vision? 0  Difficulty concentrating or making decisions? 0  Walking or climbing stairs? 0  Dressing or bathing? 0  Doing errands, shopping? 0  Preparing  Food and eating ? N  Using the Toilet? N  In the past six months, have you accidently leaked urine? N  Do you have problems with loss of bowel control? N  Managing your Medications? N  Managing your Finances? N  Housekeeping or managing your Housekeeping? N   Most recent fall risk assessment:    06/18/2022   11:09 AM  Fall Risk   Falls in the past year? 1  Number falls in past yr: 0  Injury with Fall? 1  Risk for fall due to : History of fall(s)  Follow up Falls evaluation completed    Most recent depression screenings:     06/18/2022   11:09 AM 06/09/2021   10:12 AM  PHQ 2/9 Scores  PHQ - 2 Score 0 0   Most recent cognitive screening:    06/18/2022   11:10 AM  6CIT Screen  What Year? 0 points  What month? 0 points  What time? 0 points  Count back from 20 0 points  Months in reverse 0 points  Repeat phrase 0 points  Total Score 0 points       Assessment & Plan     Annual wellness visit done today including the all of the following: Reviewed patient's Family Medical History Reviewed and updated list of patient's medical providers Assessment of cognitive impairment was done Assessed patient's functional ability Established a written schedule for health screening [place] Completed and Reviewed  Discussed health benefits of physical activity, and encouraged her to engage in regular exercise appropriate for her age and condition.    Type 2 diabetes mellitus-generally A1c runs in the 7 range and currently it is 7.0%.  She is on Glucotrol XL 10 mg daily and Janumet Exar [phone] daily.  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  ANA was negative at the time and CCP was less than 16.  She had immunofixation electrophoresis showing elevation of IgA at 726, elevated serum IgM at 488.  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  Currently she is having considerable myalgias.  Treat with a short course of steroids  starting with methylprednisolone 4 mg tablets to take as follows: 6 tablets on 1 day 1 and take in tapering course as directed i.e. 6-5-4-3-2-1 taper.  Being sent to [facility].  Has been diagnosed by rheumatologist with polyclonal gammopathy.  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  Follow-up MRI recommended in 2024.     IElby Showers, MD, have reviewed all documentation for this visit. The  documentation on 07/29/22 for the exam, diagnosis, procedures, and orders are all accurate and complete.   Angus Seller, CMA

## 2022-06-21 NOTE — Patient Instructions (Addendum)
I am glad to report that your CT of abdomen is negative.  I think you may have musculoskeletal pain related to your syncopal episode.  You have upcoming appointment with Dr. Curt Bears in late July.  Continue current medications for now.

## 2022-06-21 NOTE — Progress Notes (Signed)
   Subjective:    Patient ID: Nancy Wagner, female    DOB: 12-31-1950, 71 y.o.   MRN: [id number]  HPI Patient here at my request to discuss recent CT results.  She had an episode of syncope, possibly cardiac while driving to [place] in early May.  She stopped at a rest area to use the restroom.  She felt fine getting out of the car.  She subsequently collapsed on the sidewalk.  She had no warning about impending syncope.  No seizure activity was reported by bystanders.  She came to on her own and someone assisted her with some minor abrasions.  She got back in her car and drove on to [place], [place] and did okay.  At my insistence she has since seen her cardiologist.  She has a consult with Dr. Curt Bears July 24.  Encouraged her to keep this appointment.  She is complaining of some generalized abdominal pain that she relates to her fall.  CT of abdomen and pelvis and there were no intra-abdominal findings of concern.  Was done on June 23.  She has history of cholecystectomy and has a small hiatal hernia.  She still complaining of some pain in her left lateral abdominal area.    Review of Systems says she still does not feel well and is sore in her left abdominal area without nausea vomiting or weight loss.     Objective:   Physical Exam Vital signs are reviewed and are stable.  Blood pressure is excellent at 120/60 chest clear.  Cardiac exam: Regular rate and rhythm.  Abdomen is obese soft nondistended without hepatosplenomegaly or masses.  She is tender over her left lateral rib cage area       Assessment & Plan:  I think she has musculoskeletal pain from her fall.  Her CT is negative.  She still does not feel well.  She has upcoming appointment with Dr. Curt Bears July 24 for unexplained syncope.  Type 2 diabetes mellitus.  Hemoglobin A1c was 7.1% in April.  Currently on Janumet.  Essential hypertension treated with Benicar HCTZ and amlodipine  Hyperlipidemia treated with Zocor 40 mg  daily  GE reflux treated with Nexium  History of gout treated with allopurinol  Plan: She will be returning soon for upcoming Medicare wellness visit and health maintenance exam.  She has been offered medication for musculoskeletal pain but declines.

## 2022-06-22 ENCOUNTER — Encounter: Payer: Self-pay | Admitting: Cardiology

## 2022-06-22 ENCOUNTER — Ambulatory Visit: Payer: Medicare PPO | Admitting: Cardiology

## 2022-06-22 VITALS — BP 126/72 | HR 84 | Ht 63.0 in | Wt 188.4 lb

## 2022-06-22 DIAGNOSIS — R55 Syncope and collapse: Secondary | ICD-10-CM

## 2022-06-22 NOTE — Progress Notes (Signed)
Electrophysiology Office Note   Date:  06/22/2022   ID:  Nancy, Wagner 1951/09/14, MRN [id number]  PCP:  Elby Showers, MD  Cardiologist:  Dennison Bulla Primary Electrophysiologist:  Quantrell Splitt Meredith Leeds, MD    Chief Complaint: syncope   History of Present Illness: Nancy Wagner is a 71 y.o. female who is being seen today for the evaluation of syncope at the request of Nancy Wagner, [place]*. Presenting today for electrophysiology evaluation.  She has a history significant for type 2 diabetes, hypertension, morbid obesity, rheumatoid arthritis, sleep apnea.  She had an episode of syncope.  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    May 2023 she was driving to [place] to see her sister.  She was walking to the bathroom at a rest stop and suddenly found herself on the ground.  She hit her ribs and bruised the left side of her body.  She had no prodrome.  She did not trip and fall.  She was on atenolol, but this was stopped.  Her Norvasc was increased.  Her blood pressure is now well controlled.  She has not had any further episodes of syncope.  She does state that many years ago, she fell and was unaware of why she fell, but otherwise she feels well.  Today, she denies symptoms of palpitations, chest pain, shortness of breath, orthopnea, PND, lower extremity edema, claudication, dizziness, presyncope, syncope, bleeding, or neurologic sequela. The patient is tolerating medications without difficulties.    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   GERD (gastroesophageal reflux disease)    diet control   Gout    12-08-2019  per pt last episode left foot 11/ 2020   Hirsutism    History of obstructive sleep apnea    s/p  UPPP in 1985   Hypertension    followed by pcp  (12-08-2019  per pt had stress  test many yrs ago, was told ok)   Lichen simplex chronicus    OA (osteoarthritis)    RA (rheumatoid arthritis) ([place])    12-08-2019  per pt was dx years ago, treated with high level prednisone for few weeks and some other treatment with her eyes and has been in remission since   Type 2 diabetes mellitus ([place])    followed by pcp ---  (12-08-2019 checks cbg's daily in am,  fasting-- 110 to 126)   Uterine fibroid    Wears glasses    Past Surgical History:  Procedure Laterality Date   [place]  last one 05-09-2018   DILATATION & CURRETTAGE/HYSTEROSCOPY WITH RESECTOCOPE N/A 04/07/2017   Procedure: [place];  Surgeon: Eldred Manges, MD;  Location: [facility];  Service: Gynecology;  Laterality: N/A;   ROBOTIC ASSISTED SALPINGO OOPHERECTOMY Bilateral 12/14/2019   Procedure: XI ROBOTIC ASSISTED SALPINGO OOPHORECTOMY;  Surgeon: Delsa Bern, MD;  Location: [facility];  Service: Gynecology;  Laterality: Bilateral;  Bed held, but patient is expected to go home the same day. -ap   THROAT SURGERY  1985   minimal uvulopalatopharyngoplasty for sleep apnea   TUBAL LIGATION Bilateral 1981     Current Outpatient Medications  Medication Sig Dispense Refill   allopurinol (ZYLOPRIM) 100  MG tablet TAKE 1 TABLET BY MOUTH EVERY DAY 90 tablet 1   amLODipine (NORVASC) 10 MG tablet Take 1 tablet (10 mg total) by mouth daily. 90 tablet 2   Blood Glucose Monitoring Suppl (ACCU-CHEK GUIDE ME) w/Device KIT USE AS DIRECTED 1 kit prn   cetirizine (ZYRTEC) 10 MG tablet Take 10 mg by mouth as needed.     Cholecalciferol (VITAMIN D3) 50 MCG (2000 UT) TABS Take 1 tablet by mouth daily.     cloNIDine (CATAPRES) 0.1 MG tablet TAKE 1 TABLET BY MOUTH 2 TIMES DAILY. 180 tablet 3   cyclobenzaprine (FLEXERIL) 10 MG tablet TAKE ONE HALF TABLET BY MOUTH AT BEDTIME FOR MUSCULOSKELETAL PAIN (Patient taking  differently: as needed. TAKE ONE HALF TABLET BY MOUTH AT BEDTIME FOR MUSCULOSKELETAL PAIN) 30 tablet 0   esomeprazole (NEXIUM) 40 MG capsule TAKE 1 CAPSULE BY MOUTH EVERY DAY 90 capsule 1   fish oil-omega-3 fatty acids 1000 MG capsule Take 2 g by mouth at bedtime. Reported on 1/47/8295     folic acid (FOLVITE) 621 MCG tablet Take 400 mcg by mouth at bedtime.      glipiZIDE (GLUCOTROL XL) 10 MG 24 hr tablet TAKE 1 TABLET (10 MG TOTAL) BY MOUTH DAILY WITH BREAKFAST. 90 tablet 3   JANUMET XR [phone] MG TB24 TAKE 1 TABLET BY MOUTH EVERY DAY WITH BREAKFAST 90 tablet 3   methylPREDNISolone (MEDROL) 4 MG tablet Take in tapering course as directed 6-5-4-3-2-1 21 tablet 0   Multiple Vitamins-Minerals (MULTIVITAMIN WOMEN 50+) TABS Take 1 tablet by mouth at bedtime.     olmesartan-hydrochlorothiazide (BENICAR HCT) 40-25 MG tablet TAKE 1 TABLET BY MOUTH EVERY DAY 90 tablet 1   PROAIR HFA 108 (90 Base) MCG/ACT inhaler TAKE 2 PUFFS BY MOUTH EVERY 6 HOURS AS NEEDED FOR WHEEZE OR SHORTNESS OF BREATH 8.5 Inhaler 2   simvastatin (ZOCOR) 40 MG tablet Take 1 tablet (40 mg total) by mouth at bedtime. 90 tablet 3   No current facility-administered medications for this visit.    Allergies:   Adhesive [tape], Codeine, and Other   Social History:  The patient  reports that she has never smoked. She has never used smokeless tobacco. She reports current alcohol use. She reports that she does not use drugs.   Family History:  The patient's family history includes Heart disease in her sister.    ROS:  Please see the history of present illness.   Otherwise, review of systems is positive for none.   All other systems are reviewed and negative.    PHYSICAL EXAM: VS:  BP 126/72   Pulse 84   Ht '5\' 3"'  (1.6 m)   Wt 188 lb 6.4 oz (85.5 kg)   SpO2 94%   BMI 33.37 kg/m  , BMI Body mass index is 33.37 kg/m. GEN: Well nourished, well developed, in no acute distress  HEENT: normal  Neck: no JVD, carotid bruits, or  masses Cardiac: RRR; no murmurs, rubs, or gallops,no edema  Respiratory:  clear to auscultation bilaterally, normal work of breathing GI: soft, nontender, nondistended, + BS MS: no deformity or atrophy  Skin: warm and dry Neuro:  Strength and sensation are intact Psych: euthymic mood, full affect  EKG:  EKG is not ordered today. Personal review of the ekg ordered 04/23/22 shows sinus rhythm  Recent Labs: 06/15/2022: ALT 22; BUN 19; Creat 1.13; Hemoglobin 11.4; Platelets 269; Potassium 4.5; Sodium 143; TSH 1.30    Lipid Panel     Component Value  Date/Time   CHOL 151 06/15/2022 0922   TRIG 92 06/15/2022 0922   HDL 59 06/15/2022 0922   CHOLHDL 2.6 06/15/2022 0922   VLDL 18 02/08/2017 1121   LDLCALC 74 06/15/2022 0922     Wt Readings from Last 3 Encounters:  06/22/22 188 lb 6.4 oz (85.5 kg)  06/18/22 192 lb 8 oz (87.3 kg)  05/13/22 183 lb 6.4 oz (83.2 kg)      Other studies Reviewed: Additional studies/ records that were reviewed today include: TTE 01/22/21  Review of the above records today demonstrates:   1. Left ventricular ejection fraction, by estimation, is 60 to 65%. The  left ventricle has normal function. The left ventricle has no regional  wall motion abnormalities. There is mild asymmetric left ventricular  hypertrophy of the basal-septal segment.  Left ventricular diastolic parameters are consistent with Grade I  diastolic dysfunction (impaired relaxation). Elevated left atrial  pressure. The E/e' is 67.   2. Right ventricular systolic function is normal. The right ventricular  size is normal. There is normal pulmonary artery systolic pressure.   3. The mitral valve is normal in structure. Trivial mitral valve  regurgitation.   4. The aortic valve is tricuspid. There is mild calcification of the  aortic valve. There is mild thickening of the aortic valve. Aortic valve  regurgitation is not visualized. Mild aortic valve sclerosis is present,  with no evidence  of aortic valve  stenosis.   5. Aortic dilatation noted. 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   6. The inferior vena cava is normal in size with greater than 50%  respiratory variability, suggesting right atrial pressure of 3 mmHg.   Cardiac monitor 02/12/21 personally reviewed Patient had a minimum heart rate of 49 bpm, maximum heart rate of 150 bpm, and average heart rate of 66 bpm through two devices. Computer algorithm reads heart rates of 29 and 37 bpm; this is based on the dropped beat of a Mobitz Type I heart block. Predominant underlying rhythm was sinus rhythm. One run of non-sustained ventricular tachycardia occurred lasting 4 beats at longest with a max rate of 150 bpm at fastest. Isolated PACs were rare (<1.0%), with rare couplets or triplets present. Isolated PVCs were rare (<1.0%), with rare couplets present. There is Mobitz Type I heart block and nocturnal Mobitz Type II heart block (01/13/21 4:28 AM). No triggered and diary events.   ASSESSMENT AND PLAN:  1.  Syncope: Fortunately has had no further episodes of syncope.  Despite that, this is concerning as the episode was quite sudden.  We discussed Linq monitor implant.  She Jammal Sarr review further information and let us know if she wishes to have a monitor.  I have also told her that since her atenolol has been stopped, it would be reasonable to monitor and if she has another episode implanted that time.  2.  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  Atenolol has since been stopped.  3.  Hypertension: Currently well controlled  4.  Obesity: Diet and exercise encouraged Body mass index is 33.37 kg/m.  Current medicines are reviewed at length with the patient today.   The patient does not have concerns regarding her medicines.  The following changes were made today:  none  Labs/ tests ordered today include:  No orders of the defined types were placed in this encounter.    Disposition:   FU  with Marabella Popiel pending decision on Linq monitor  Signed, Hershall Benkert  Meredith Leeds, MD  06/22/2022 9:31 AM     [facility] [address] [place] [address] [phone] (office) [phone] (fax)

## 2022-06-22 NOTE — Patient Instructions (Signed)
Medication Instructions:  Your physician recommends that you continue on your current medications as directed. Please refer to the Current Medication list given to you today.  *If you need a refill on your cardiac medications before your next appointment, please call your pharmacy*   Lab Work: None ordered If you have labs (blood work) drawn today and your tests are completely normal, you will receive your results only by: [place] (if you have MyChart) OR A paper copy in the mail If you have any lab test that is abnormal or we need to change your treatment, we will call you to review the results.   Testing/Procedures: Your physician has recommended that you have a loop recorder inserted. Please call the office if you would like to proceed with a loop recorder implant.    Follow-Up: At [facility] [place], you and your health needs are our priority.  As part of our continuing mission to provide you with exceptional heart care, we have created designated Provider Care Teams.  These Care Teams include your primary Cardiologist (physician) and Advanced Practice Providers (APPs -  Physician Assistants and Nurse Practitioners) who all work together to provide you with the care you need, when you need it.  We recommend signing up for the patient portal called "MyChart".  Sign up information is provided on this After Visit Summary.  MyChart is used to connect with patients for Virtual Visits (Telemedicine).  Patients are able to view lab/test results, encounter notes, upcoming appointments, etc.  Non-urgent messages can be sent to your provider as well.   To learn more about what you can do with MyChart, go to [email or web address].    Your next appointment:   To be   determined  The format for your next appointment:   In Person  Provider:   Allegra Lai, MD    Thank you for choosing [facility]!!   Trinidad Curet, RN [phone]  Other Instructions  Implantable Loop  Recorder Placement  An implantable loop recorder is a small electronic device that is placed under the skin of your chest. The device records the electrical activity of your heart over a long period of time. Your health care provider can download these recordings to monitor your heart. You may need an implantable loop recorder if you have periods of abnormal heart activity (arrhythmias) or unexplained fainting (syncope). The recorder can be left in place for 1 year or longer. Tell a health care provider about: Any allergies you have. All medicines you are taking, including vitamins, herbs, eye drops, creams, and over-the-counter medicines. Any problems you or family members have had with anesthetic medicines. Any bleeding problems you have. Any surgeries you have had. Any medical conditions you have. Whether you are pregnant or may be pregnant. What are the risks? Generally, this is a safe procedure. However, problems may occur, including: Infection. Bleeding. Allergic reactions to anesthetic medicines. Damage to nerves or blood vessels. Failure of the device to work. This could require another surgery to replace it. What happens before the procedure?  You may have a physical exam, blood tests, and imaging tests of your heart, such as a chest X-ray. Follow instructions from your health care provider about eating or drinking restrictions. Ask your health care provider about: Changing or stopping your regular medicines. This is especially important if you are taking diabetes medicines or blood thinners. Taking medicines such as aspirin and ibuprofen. These medicines can thin your blood. Do not take these medicines unless your health  care provider tells you to take them. Taking over-the-counter medicines, vitamins, herbs, and supplements. Ask your health care provider how your surgical site will be marked or identified. Ask your health care provider what steps will be taken to help prevent  infection. These may include: Removing hair at the surgery site. Washing skin with a germ-killing soap. Plan to have someone take you home from the hospital or clinic. Plan to have a responsible adult care for you for at least 24 hours after you leave the hospital or clinic. This is important. Do not use any products that contain nicotine or tobacco, such as cigarettes and e-cigarettes. If you need help quitting, ask your health care provider. What happens during the procedure? An IV will be inserted into one of your veins. You may be given one or more of the following: A medicine to help you relax (sedative). A medicine to numb the area (local anesthetic). A small incision will be made on the left side of your upper chest. A pocket will be created under your skin. The device will be placed in the pocket. The incision will be closed with stitches (sutures) or adhesive strips. A bandage (dressing) will be placed over the incision. The procedure may vary among health care providers and hospitals. What happens after the procedure? Your blood pressure, heart rate, breathing rate, and blood oxygen level will be monitored until you leave the hospital or clinic. You may be able to go home on the day of your surgery. Before you go home: Your health care provider will program your recorder. You will learn how to trigger your device with a handheld activator. You will learn how to send recordings to your health care provider. You will get an ID card for your device, and you will be told when to use it. Do not drive for 24 hours if you were given a sedative during your procedure. Summary An implantable loop recorder is a small electronic device that is placed under the skin of your chest to monitor your heart over a long period of time. The recorder can be left in place for 1 year or longer. Plan to have someone take you home from the hospital or clinic. This information is not intended to replace  advice given to you by your health care provider. Make sure you discuss any questions you have with your health care provider. Document Revised: 03/18/2021 Document Reviewed: 03/18/2021 Elsevier Patient Education  [place]

## 2022-06-29 ENCOUNTER — Telehealth: Payer: Self-pay | Admitting: Cardiology

## 2022-06-29 NOTE — Telephone Encounter (Signed)
Patient is returning nurse Sherri's call.

## 2022-06-29 NOTE — Telephone Encounter (Signed)
Voicemail full, unable to leave a message

## 2022-06-30 ENCOUNTER — Telehealth: Payer: Self-pay | Admitting: Internal Medicine

## 2022-06-30 ENCOUNTER — Ambulatory Visit: Payer: Medicare PPO | Admitting: Internal Medicine

## 2022-06-30 ENCOUNTER — Encounter: Payer: Self-pay | Admitting: Internal Medicine

## 2022-06-30 VITALS — BP 122/66 | HR 82 | Temp 98.4°F | Wt 188.4 lb

## 2022-06-30 DIAGNOSIS — E1169 Type 2 diabetes mellitus with other specified complication: Secondary | ICD-10-CM | POA: Diagnosis not present

## 2022-06-30 DIAGNOSIS — I10 Essential (primary) hypertension: Secondary | ICD-10-CM

## 2022-06-30 DIAGNOSIS — M1712 Unilateral primary osteoarthritis, left knee: Secondary | ICD-10-CM

## 2022-06-30 DIAGNOSIS — M79605 Pain in left leg: Secondary | ICD-10-CM

## 2022-06-30 DIAGNOSIS — E785 Hyperlipidemia, unspecified: Secondary | ICD-10-CM | POA: Diagnosis not present

## 2022-06-30 DIAGNOSIS — Z8739 Personal history of other diseases of the musculoskeletal system and connective tissue: Secondary | ICD-10-CM

## 2022-06-30 DIAGNOSIS — D890 Polyclonal hypergammaglobulinemia: Secondary | ICD-10-CM

## 2022-06-30 MED ORDER — METHYLPREDNISOLONE 4 MG PO TABS: 4.0000 mg | ORAL_TABLET | Freq: Every day | ORAL | 0 refills | Status: DC

## 2022-06-30 MED ORDER — HYDROCODONE-ACETAMINOPHEN 10-325 MG PO TABS: 1.0000 | ORAL_TABLET | Freq: Three times a day (TID) | ORAL | 0 refills | Status: AC | PRN

## 2022-06-30 NOTE — Telephone Encounter (Signed)
Nancy Wagner [phone]  [name] has called with having left leg pain and numbness where it is on the outside in the thigh area going from the hi to knee. On Saturday and Sunday she could hardley walk, she has been putting ointment on it and patches plus taking medication, she would like to come in and see about getting some relief.

## 2022-06-30 NOTE — Progress Notes (Signed)
   Subjective:    Patient ID: Nancy Wagner, female    DOB: [birth year], 71 y.o.   MRN: [id number]  HPI Patient called today indicating she was having left leg pain and numbness on the outside area of her thigh to her knee.  Says she could barely walk on Saturday and Sunday.  She tried ointment and some pain patches.  She also has a history of unexplained syncope while driving to [place] when she stopped at a rest area.  She has seen cardiologist.  A loop recorder has been recommended.  She had labs done in mid July including hemoglobin A1c which was 7%.  Was also found to be mildly anemic with hemoglobin 11.4 g in mid July and in 2022 her hemoglobin was 12.1 g.  TSH was normal.  Lipid panel was normal.  Creatinine 1.13.  Liver functions normal.  She is requesting pain medication and I have given her 5 days of hydrocodone APAP 10/325 to take sparingly.  [facility]  Her mammogram has been ordered but is not due until October of this year.  There is a history of carpal tunnel syndrome of right wrist and is seeing Dr. Leanora Cover.  Has seen Dr. Trudie Reed at [facility].  Dr. Trudie Reed feels that patient has carpal tunnel of the right wrist, low back pain at multiple sites, polyclonal gammopathy and an elevated sed rate.  She also has an elevated rheumatoid factor.  In June we ordered a CT of the abdomen and pelvis because patient complained of acute abdominal pain with weight loss.  Cholecystectomy was noted.  Small hiatal hernia noted.  No acute process in abdomen or pelvis.   Review of Systems see above has not had loss of consciousness recently at all.  Says she is in a lot of pain particularly down her left lateral thigh  Says that there is left leg pain and numbness left lateral thigh to knee area.     Objective:   Physical Exam  Skin warm and dry.  Chest clear.  Cardiac exam regular rate and rhythm.  Blood pressure 122/66  pulse 82 temperature 98.4 degrees pulse oximetry 97% weight is 188 pounds 6.4 ounces BMI 33.37  Straight leg raising is negative at 90 degrees.  She is tender along her left lateral leg.  Bevelyn Buckles' sign is negative.      Assessment & Plan:   Left lateral leg pain-?  Lumbar radiculopathy.  We are referring her for physical therapy.  She will take a tapering course of steroids i.e. Medrol 6-day Dosepak and may take hydrocodone APAP 10/325 sparingly.  If not improving with physical therapy then I think we have to do lumbar MRI.

## 2022-06-30 NOTE — Patient Instructions (Addendum)
Take Medrol 6-day Dosepak in tapering course as directed starting with 6 tablets day 1 and decreasing by 1 tablet daily i.e. 6-5-4-3-2-1 taper.  May take hydrocodone APAP 10/325 sparingly for back pain.  [facility] to [facility].  Reevaluate in 3 weeks.  If not improving with PT, then I think we need to do lumbar MRI.

## 2022-06-30 NOTE — Progress Notes (Deleted)
     Annual Wellness Visit     Patient: Nancy Wagner, Female    DOB: Aug 31, 1951, 71 y.o.   MRN: [id number] Visit Date: 06/30/2022  No chief complaint on file.  Subjective    Nancy Wagner is a 71 y.o. female who presents today for her Annual Wellness Visit.  HPI   Social History   Social History Narrative   Not on file    Patient Care Team: Baxley, Cresenciano Lick, MD as PCP - General (Internal Medicine) Werner Lean, MD as PCP - Cardiology (Cardiology)  Review of Systems   Objective    Vitals: There were no vitals taken for this visit.  Physical Exam   Most recent functional status assessment:    06/18/2022   11:09 AM  In your present state of health, do you have any difficulty performing the following activities:  Hearing? 0  Vision? 0  Difficulty concentrating or making decisions? 0  Walking or climbing stairs? 0  Dressing or bathing? 0  Doing errands, shopping? 0  Preparing Food and eating ? N  Using the Toilet? N  In the past six months, have you accidently leaked urine? N  Do you have problems with loss of bowel control? N  Managing your Medications? N  Managing your Finances? N  Housekeeping or managing your Housekeeping? N   Most recent fall risk assessment:    06/18/2022   11:09 AM  Fall Risk   Falls in the past year? 1  Number falls in past yr: 0  Injury with Fall? 1  Risk for fall due to : History of fall(s)  Follow up Falls evaluation completed    Most recent depression screenings:    06/18/2022   11:09 AM 06/09/2021   10:12 AM  PHQ 2/9 Scores  PHQ - 2 Score 0 0   Most recent cognitive screening:    06/18/2022   11:10 AM  6CIT Screen  What Year? 0 points  What month? 0 points  What time? 0 points  Count back from 20 0 points  Months in reverse 0 points  Repeat phrase 0 points  Total Score 0 points       Assessment & Plan     Annual wellness visit done today including the all of the following: Reviewed patient's  Family Medical History Reviewed and updated list of patient's medical providers Assessment of cognitive impairment was done Assessed patient's functional ability Established a written schedule for health screening [place] Completed and Reviewed  Discussed health benefits of physical activity, and encouraged her to engage in regular exercise appropriate for her age and condition.         {provider attestation***:1}   Braven Wolk Barron Alvine, CMA

## 2022-06-30 NOTE — Telephone Encounter (Signed)
scheduled

## 2022-07-06 NOTE — Progress Notes (Deleted)
Cardiology Office Note:    Date:  07/06/2022   ID:  Nancy Wagner, DOB September 20, 1951, MRN [id number]  PCP:  Nancy Showers, MD  [facility] Cardiologist:  Werner Lean, MD  [place] Electrophysiologist:  None   CC: Blood pressure and heart block f/u  History of Present Illness:    Nancy Wagner is a 71 y.o. female with a hx of Obesity, DM with HTN, HLd who presents for evaluation 01/01/21.  2022: In interim of this visit, patient had echo and ziopatch. Had vagotonic II HB; planned to stop atenolol but patient was unable to be reached X 3.  Got voicemail and stopped atenolol and immediately have SBP 190.  Did not call in but returned to atenolol.  Seen 04/16/21.   2023: Saw EP after had a fall; consideration of LINQ.  Patient notes that she is doing ***.   Since last visit notes *** . There are no*** interval hospital/ED visit.    No chest pain or pressure ***.  No SOB/DOE*** and no PND/Orthopnea***.  No weight gain or leg swelling***.  No palpitations or syncope ***.  Ambulatory blood pressure ***.   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   GERD (gastroesophageal reflux disease)    diet control   Gout    12-08-2019  per pt last episode left foot 11/ 2020   Hirsutism    History of obstructive sleep apnea    s/p  UPPP in 1985   Hypertension    followed by pcp  (12-08-2019  per pt had stress test many yrs ago, was told ok)   Lichen simplex chronicus    OA (osteoarthritis)    RA (rheumatoid arthritis) ([place])    12-08-2019  per pt was dx years ago, treated with high level prednisone for few weeks and some other treatment with her eyes and has been in remission since   Type 2 diabetes mellitus ([place])    followed by pcp ---  (12-08-2019 checks cbg's daily in am,  fasting-- 110 to 126)   Uterine fibroid    Wears glasses     Past  Surgical History:  Procedure Laterality Date   [place]  last one 05-09-2018   DILATATION & CURRETTAGE/HYSTEROSCOPY WITH RESECTOCOPE N/A 04/07/2017   Procedure: [place];  Surgeon: Eldred Manges, MD;  Location: [facility];  Service: Gynecology;  Laterality: N/A;   ROBOTIC ASSISTED SALPINGO OOPHERECTOMY Bilateral 12/14/2019   Procedure: XI ROBOTIC ASSISTED SALPINGO OOPHORECTOMY;  Surgeon: Delsa Bern, MD;  Location: [facility];  Service: Gynecology;  Laterality: Bilateral;  Bed held, but patient is expected to go home the same day. -ap   THROAT SURGERY  1985   minimal uvulopalatopharyngoplasty for sleep apnea   TUBAL LIGATION Bilateral 1981    Current Medications: No outpatient medications have been marked as taking for the 07/07/22 encounter (Appointment) with Werner Lean, MD.     Allergies:   Adhesive [tape], Codeine, and Other   Social History   Socioeconomic History   Marital status: Married    Spouse name: Not on file   Number of children: 2   Years of education: Not on file   Highest education level: Not on file  Occupational History  Not on file  Tobacco Use   Smoking status: Never   Smokeless tobacco: Never  Vaping Use   Vaping Use: Never used  Substance and Sexual Activity   Alcohol use: Yes    Comment: very rare   Drug use: Never   Sexual activity: Not on file  Other Topics Concern   Not on file  Social History Narrative   Not on file   Social Determinants of Health   Financial Resource Strain: Not on file  Food Insecurity: Not on file  Transportation Needs: Not on file  Physical Activity: Not on file  Stress: Not on file  Social Connections: Not on file    Social: is about to celebrate her 22th wedding anniversary; she is the oldest of 70  Family History: History of coronary artery disease notable for father. History  of heart failure notable for no members. History of arrhythmia notable for no members. Sister has and aunt had heart problems of some sort.  ROS:   Please see the history of present illness.     All other systems reviewed and are negative.  EKGs/Labs/Other Studies Reviewed:    The following studies were reviewed today:  EKG:   10/13/21: SR with PACs  01/01/21: NSR rate 70 WNL 12/11/19: NSR 64 WNL  Cardiac Event Monitoring: Date: 02/12/21 Results: Patient had a minimum heart rate of 49 bpm, maximum heart rate of 150 bpm, and average heart rate of 66 bpm through two devices. Computer algorithm reads heart rates of 29 and 37 bpm; this is based on the dropped beat of a Mobitz Type I heart block. Predominant underlying rhythm was sinus rhythm. One run of non-sustained ventricular tachycardia occurred lasting 4 beats at longest with a max rate of 150 bpm at fastest. Isolated PACs were rare (<1.0%), with rare couplets or triplets present. Isolated PVCs were rare (<1.0%), with rare couplets present. There is Mobitz Type I heart block and nocturnal Mobitz Type II heart block (01/13/21 4:28 AM). No triggered and diary events.    Transthoracic Echocardiogram: Date: 01/22/21 Results: No evidence of significant aortic dilation when indexed for height  1. Left ventricular ejection fraction, by estimation, is 60 to 65%. The  left ventricle has normal function. The left ventricle has no regional  wall motion abnormalities. There is mild asymmetric left ventricular  hypertrophy of the basal-septal segment.  Left ventricular diastolic parameters are consistent with Grade I  diastolic dysfunction (impaired relaxation). Elevated left atrial  pressure. The E/e' is 51.   2. Right ventricular systolic function is normal. The right ventricular  size is normal. There is normal pulmonary artery systolic pressure.   3. The mitral valve is normal in structure. Trivial mitral valve  regurgitation.   4. The  aortic valve is tricuspid. There is mild calcification of the  aortic valve. There is mild thickening of the aortic valve. Aortic valve  regurgitation is not visualized. Mild aortic valve sclerosis is present,  with no evidence of aortic valve  stenosis.   5. Aortic dilatation noted. 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   6. The inferior vena cava is normal in size with greater than 50%  respiratory variability, suggesting right atrial pressure of 3 mmHg.    Recent Labs: 06/15/2022: ALT 22; BUN 19; Creat 1.13; Hemoglobin 11.4; Platelets 269; Potassium 4.5; Sodium 143; TSH 1.30  Recent Lipid Panel    Component Value Date/Time   CHOL 151 06/15/2022 0922   TRIG  92 06/15/2022 0922   HDL 59 06/15/2022 0922   CHOLHDL 2.6 06/15/2022 0922   VLDL 18 02/08/2017 1121   LDLCALC 74 06/15/2022 0922    Physical Exam:    VS:  There were no vitals taken for this visit.    Wt Readings from Last 3 Encounters:  06/30/22 188 lb 6.4 oz (85.5 kg)  06/22/22 188 lb 6.4 oz (85.5 kg)  06/18/22 192 lb 8 oz (87.3 kg)    Gen: no  distress, obesity Neck: No JVD Cardiac: No Rubs or Gallops, no murmur, normal rate, +2 radial pulses Respiratory: Clear to auscultation bilaterally, normal effort, normal  respiratory rate GI: Soft, nontender, non-distended  MS: No  edema;  moves all extremities Integument: Skin feels warm Neuro:  At time of evaluation, alert and oriented to person/place/time/situation  Psych: Normal affect, patient feels well, thought tired   ASSESSMENT:    No diagnosis found.   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  Concerns regarding medicines are outlined above.  No orders of the defined types were placed in this encounter.   No orders of the defined types were placed in this encounter.    There are no Patient Instructions on file for this visit.   Signed, Werner Lean, MD  07/06/2022 5:04 PM    [place]

## 2022-07-07 ENCOUNTER — Ambulatory Visit: Payer: Medicare PPO | Admitting: Internal Medicine

## 2022-07-10 ENCOUNTER — Telehealth: Payer: Self-pay | Admitting: Cardiology

## 2022-07-10 NOTE — Telephone Encounter (Signed)
Patient has decided that she does want a loop recorder. She would like to make an appointment to have this done.

## 2022-07-10 NOTE — Telephone Encounter (Signed)
Patient is returning phone call to Dr. Curt Bears or nurse. Please call patient back

## 2022-07-14 DIAGNOSIS — R700 Elevated erythrocyte sedimentation rate: Secondary | ICD-10-CM | POA: Diagnosis not present

## 2022-07-14 DIAGNOSIS — M1991 Primary osteoarthritis, unspecified site: Secondary | ICD-10-CM | POA: Diagnosis not present

## 2022-07-14 DIAGNOSIS — R768 Other specified abnormal immunological findings in serum: Secondary | ICD-10-CM | POA: Diagnosis not present

## 2022-07-14 DIAGNOSIS — Z6833 Body mass index (BMI) 33.0-33.9, adult: Secondary | ICD-10-CM | POA: Diagnosis not present

## 2022-07-14 DIAGNOSIS — E669 Obesity, unspecified: Secondary | ICD-10-CM | POA: Diagnosis not present

## 2022-07-14 DIAGNOSIS — M545 Low back pain, unspecified: Secondary | ICD-10-CM | POA: Diagnosis not present

## 2022-07-14 DIAGNOSIS — G5601 Carpal tunnel syndrome, right upper limb: Secondary | ICD-10-CM | POA: Diagnosis not present

## 2022-07-14 DIAGNOSIS — D890 Polyclonal hypergammaglobulinemia: Secondary | ICD-10-CM | POA: Diagnosis not present

## 2022-07-14 DIAGNOSIS — M1009 Idiopathic gout, multiple sites: Secondary | ICD-10-CM | POA: Diagnosis not present

## 2022-07-15 DIAGNOSIS — R2689 Other abnormalities of gait and mobility: Secondary | ICD-10-CM | POA: Diagnosis not present

## 2022-07-15 DIAGNOSIS — M5459 Other low back pain: Secondary | ICD-10-CM | POA: Diagnosis not present

## 2022-07-15 DIAGNOSIS — M6281 Muscle weakness (generalized): Secondary | ICD-10-CM | POA: Diagnosis not present

## 2022-07-15 DIAGNOSIS — R1084 Generalized abdominal pain: Secondary | ICD-10-CM | POA: Diagnosis not present

## 2022-07-18 ENCOUNTER — Other Ambulatory Visit: Payer: Self-pay | Admitting: Internal Medicine

## 2022-07-20 DIAGNOSIS — M5459 Other low back pain: Secondary | ICD-10-CM | POA: Diagnosis not present

## 2022-07-20 DIAGNOSIS — R1084 Generalized abdominal pain: Secondary | ICD-10-CM | POA: Diagnosis not present

## 2022-07-20 DIAGNOSIS — R2689 Other abnormalities of gait and mobility: Secondary | ICD-10-CM | POA: Diagnosis not present

## 2022-07-20 DIAGNOSIS — M6281 Muscle weakness (generalized): Secondary | ICD-10-CM | POA: Diagnosis not present

## 2022-07-29 DIAGNOSIS — R1084 Generalized abdominal pain: Secondary | ICD-10-CM | POA: Diagnosis not present

## 2022-07-29 DIAGNOSIS — R2689 Other abnormalities of gait and mobility: Secondary | ICD-10-CM | POA: Diagnosis not present

## 2022-07-29 DIAGNOSIS — M6281 Muscle weakness (generalized): Secondary | ICD-10-CM | POA: Diagnosis not present

## 2022-07-29 DIAGNOSIS — M5459 Other low back pain: Secondary | ICD-10-CM | POA: Diagnosis not present

## 2022-07-29 NOTE — Patient Instructions (Addendum)
A loop recorder has been recommended by electrophysiologist.  Stay out of the heat and do not travel alone while driving.  Follow-up in 6 months.  Continue with physical therapy as long as it is helpful.  Take tapering course of prednisone over 6 days.  Follow-up here in January.  Loop recorder planned for September.  Take short course of steroids i.e. Medrol 4 mg tablets starting with 6 tablets day 1 and decreasing by 1 tablet daily for musculoskeletal pain.

## 2022-07-31 DIAGNOSIS — R2689 Other abnormalities of gait and mobility: Secondary | ICD-10-CM | POA: Diagnosis not present

## 2022-07-31 DIAGNOSIS — M6281 Muscle weakness (generalized): Secondary | ICD-10-CM | POA: Diagnosis not present

## 2022-07-31 DIAGNOSIS — R1084 Generalized abdominal pain: Secondary | ICD-10-CM | POA: Diagnosis not present

## 2022-07-31 DIAGNOSIS — M5459 Other low back pain: Secondary | ICD-10-CM | POA: Diagnosis not present

## 2022-08-04 DIAGNOSIS — M5459 Other low back pain: Secondary | ICD-10-CM | POA: Diagnosis not present

## 2022-08-04 DIAGNOSIS — R2689 Other abnormalities of gait and mobility: Secondary | ICD-10-CM | POA: Diagnosis not present

## 2022-08-04 DIAGNOSIS — M6281 Muscle weakness (generalized): Secondary | ICD-10-CM | POA: Diagnosis not present

## 2022-08-04 DIAGNOSIS — R1084 Generalized abdominal pain: Secondary | ICD-10-CM | POA: Diagnosis not present

## 2022-08-06 DIAGNOSIS — M5459 Other low back pain: Secondary | ICD-10-CM | POA: Diagnosis not present

## 2022-08-06 DIAGNOSIS — M6281 Muscle weakness (generalized): Secondary | ICD-10-CM | POA: Diagnosis not present

## 2022-08-06 DIAGNOSIS — R1084 Generalized abdominal pain: Secondary | ICD-10-CM | POA: Diagnosis not present

## 2022-08-06 DIAGNOSIS — R2689 Other abnormalities of gait and mobility: Secondary | ICD-10-CM | POA: Diagnosis not present

## 2022-08-12 DIAGNOSIS — R2689 Other abnormalities of gait and mobility: Secondary | ICD-10-CM | POA: Diagnosis not present

## 2022-08-12 DIAGNOSIS — M5459 Other low back pain: Secondary | ICD-10-CM | POA: Diagnosis not present

## 2022-08-12 DIAGNOSIS — M6281 Muscle weakness (generalized): Secondary | ICD-10-CM | POA: Diagnosis not present

## 2022-08-12 DIAGNOSIS — R1084 Generalized abdominal pain: Secondary | ICD-10-CM | POA: Diagnosis not present

## 2022-08-14 DIAGNOSIS — M6281 Muscle weakness (generalized): Secondary | ICD-10-CM | POA: Diagnosis not present

## 2022-08-14 DIAGNOSIS — M5459 Other low back pain: Secondary | ICD-10-CM | POA: Diagnosis not present

## 2022-08-14 DIAGNOSIS — R2689 Other abnormalities of gait and mobility: Secondary | ICD-10-CM | POA: Diagnosis not present

## 2022-08-14 DIAGNOSIS — R1084 Generalized abdominal pain: Secondary | ICD-10-CM | POA: Diagnosis not present

## 2022-08-17 ENCOUNTER — Ambulatory Visit: Payer: Medicare PPO | Admitting: Internal Medicine

## 2022-08-17 ENCOUNTER — Encounter: Payer: Self-pay | Admitting: Internal Medicine

## 2022-08-17 VITALS — BP 128/60 | HR 84 | Temp 98.0°F | Ht 63.0 in | Wt 191.8 lb

## 2022-08-17 DIAGNOSIS — Z7184 Encounter for health counseling related to travel: Secondary | ICD-10-CM | POA: Diagnosis not present

## 2022-08-17 DIAGNOSIS — M5459 Other low back pain: Secondary | ICD-10-CM | POA: Diagnosis not present

## 2022-08-17 DIAGNOSIS — Z87898 Personal history of other specified conditions: Secondary | ICD-10-CM

## 2022-08-17 DIAGNOSIS — M6281 Muscle weakness (generalized): Secondary | ICD-10-CM | POA: Diagnosis not present

## 2022-08-17 DIAGNOSIS — R2689 Other abnormalities of gait and mobility: Secondary | ICD-10-CM | POA: Diagnosis not present

## 2022-08-17 DIAGNOSIS — R1084 Generalized abdominal pain: Secondary | ICD-10-CM | POA: Diagnosis not present

## 2022-08-17 MED ORDER — BENZONATATE 100 MG PO CAPS: 100.0000 mg | ORAL_CAPSULE | Freq: Three times a day (TID) | ORAL | 0 refills | Status: DC | PRN

## 2022-08-17 MED ORDER — LEVOFLOXACIN 500 MG PO TABS: 500.0000 mg | ORAL_TABLET | Freq: Every day | ORAL | 0 refills | Status: AC

## 2022-08-17 MED ORDER — ALBUTEROL SULFATE HFA 108 (90 BASE) MCG/ACT IN AERS: 2.0000 | INHALATION_SPRAY | Freq: Four times a day (QID) | RESPIRATORY_TRACT | 0 refills | Status: DC | PRN

## 2022-08-18 ENCOUNTER — Ambulatory Visit: Payer: Medicare PPO | Attending: Cardiology | Admitting: Cardiology

## 2022-08-18 ENCOUNTER — Encounter: Payer: Self-pay | Admitting: Cardiology

## 2022-08-18 VITALS — BP 134/60 | HR 89 | Ht 63.0 in | Wt 190.2 lb

## 2022-08-18 DIAGNOSIS — I441 Atrioventricular block, second degree: Secondary | ICD-10-CM

## 2022-08-18 DIAGNOSIS — R55 Syncope and collapse: Secondary | ICD-10-CM | POA: Diagnosis not present

## 2022-08-18 DIAGNOSIS — E669 Obesity, unspecified: Secondary | ICD-10-CM

## 2022-08-18 NOTE — Patient Instructions (Signed)
Medication Instructions:  Your physician recommends that you continue on your current medications as directed. Please refer to the Current Medication list given to you today.  Labwork: None ordered.  Testing/Procedures: None ordered.  Follow-Up:  Your physician wants you to follow-up as needed with Dr. Camnitz..  You will receive a reminder letter in the mail two months in advance. If you don't receive a letter, please call our office to schedule the follow-up appointment.    Implantable Loop Recorder Placement, Care After This sheet gives you information about how to care for yourself after your procedure. Your health care provider may also give you more specific instructions. If you have problems or questions, contact your health care provider. What can I expect after the procedure? After the procedure, it is common to have: Soreness or discomfort near the incision. Some swelling or bruising near the incision.  Follow these instructions at home: Incision care  Monitor your cardiac device site for redness, swelling, and drainage. Call the device clinic at [phone] if you experience these symptoms or fever/chills.  Keep the large square bandage on your site for 24 hours and then you may remove it yourself. Keep the steri-strips underneath in place.   You may shower after 72 hours / 3 days from your procedure with the steri-strips in place. They will usually fall off on their own, or may be removed after 10 days. Pat dry.   Avoid lotions, ointments, or perfumes over your incision until it is well-healed.  Please do not submerge in water until your site is completely healed.   Your device is MRI compatible.   Remote monitoring is used to monitor your cardiac device from home. This monitoring is scheduled every month by our office. It allows us to keep an eye on the function of your device to ensure it is working properly.  If your wound site starts to bleed apply pressure.     For help with the monitor please call Med[employer] Monitor Support Specialist directly at [phone].    If you have any questions/concerns please call the device clinic at [phone].  Activity  Return to your normal activities.  General instructions Follow instructions from your health care provider about how to manage your implantable loop recorder and transmit the information. Learn how to activate a recording if this is necessary for your type of device. You may go through a metal detection gate, and you may let someone hold a metal detector over your chest. Show your ID card if needed. Do not have an MRI unless you check with your health care provider first. Take over-the-counter and prescription medicines only as told by your health care provider. Keep all follow-up visits as told by your health care provider. This is important. Contact a health care provider if: You have redness, swelling, or pain around your incision. You have a fever. You have pain that is not relieved by your pain medicine. You have triggered your device because of fainting (syncope) or because of a heartbeat that feels like it is racing, slow, fluttering, or skipping (palpitations). Get help right away if you have: Chest pain. Difficulty breathing. Summary After the procedure, it is common to have soreness or discomfort near the incision. Change your dressing as told by your health care provider. Follow instructions from your health care provider about how to manage your implantable loop recorder and transmit the information. Keep all follow-up visits as told by your health care provider. This is important. This information is   not intended to replace advice given to you by your health care provider. Make sure you discuss any questions you have with your health care provider. Document Released: 10/28/2015 Document Revised: 01/01/2018 Document Reviewed: 01/01/2018 Elsevier Patient Education  2020 Elsevier  Inc.  
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
## 2022-08-19 DIAGNOSIS — M5459 Other low back pain: Secondary | ICD-10-CM | POA: Diagnosis not present

## 2022-08-19 DIAGNOSIS — R1084 Generalized abdominal pain: Secondary | ICD-10-CM | POA: Diagnosis not present

## 2022-08-19 DIAGNOSIS — R2689 Other abnormalities of gait and mobility: Secondary | ICD-10-CM | POA: Diagnosis not present

## 2022-08-19 DIAGNOSIS — M6281 Muscle weakness (generalized): Secondary | ICD-10-CM | POA: Diagnosis not present

## 2022-08-19 NOTE — Progress Notes (Signed)
   Subjective:    Patient ID: Nancy Wagner, female    DOB: 19-May-1951, 71 y.o.   MRN: [id number]  HPI Patient here regarding Travel advice encounter. Planning trip to [place] in the near future.Tetanus update needed. Suggest new Covid booster when available. Suggest flu vaccine. Suggest pneumococcal 20 vaccine.  Needs typhoid vaccine as well.This was prescribed. Hepatitis A vaccine  will be ordered for her today. Likely will come in from [place] in a couple of days.  Has appt to see Cardiologist about syncope. Loop recorder will be placed by Cardiologist. Hx of second degree  AV block and unexplained syncope x 1.  Review of Systems denies chest pain or recent syncope.  No shortness of breath.     Objective:   Physical Exam Blood pressure 128/60 pulse 84 temperature 98 degrees pulse oximetry 99% weight 191 pounds 12.8 ounces BMI 33.98 Neck is supple without JVD thyromegaly or carotid bruits.  Chest clear.  Cardiac exam today regular rate and rhythm without ectopy.      Assessment & Plan:  History of unexplained syncope-is to get a loop recorder in the near future.  I believe this was cardiac syncope.  Travel advice encounter.  Hepatitis A vaccine ordered and we will notify her when it comes in from pharmacy.  Since she will be traveling to a foreign country I have prescribed Levaquin 500 mg daily for 10 days should she develop a urinary infection or have a respiratory infection.  Tessalon Perles were prescribed 100 mg up to 3 times daily as needed for cough.  Ventolin inhaler refilled.  Time spent discussing back issues and travel advice needs with patient, prescribing oral typhoid, ordering Hepatitis A vaccine and E scribing antibiotic and Tessalon Perles for treatment as well as Ventolin inhaler is 30 minutes.  Max needs discussed.

## 2022-08-24 ENCOUNTER — Ambulatory Visit (INDEPENDENT_AMBULATORY_CARE_PROVIDER_SITE_OTHER): Payer: Medicare PPO

## 2022-08-24 VITALS — BP 124/70 | Temp 98.0°F

## 2022-08-24 DIAGNOSIS — Z7185 Encounter for immunization safety counseling: Secondary | ICD-10-CM | POA: Diagnosis not present

## 2022-08-24 DIAGNOSIS — Z23 Encounter for immunization: Secondary | ICD-10-CM

## 2022-08-24 NOTE — Patient Instructions (Signed)
Hepatitis A vaccine given by CMA. Next dose due in 6 months.

## 2022-08-24 NOTE — Progress Notes (Signed)
   Subjective:    Patient ID: Nancy Wagner, female    DOB: 10-20-1951, 71 y.o.   MRN: [id number]  HPI  Patient traveling to [place] soon and is here for Hepatitis A vaccine, This was given by CMA today. Another dose due in 6 months.  MJB, MD    Review of Systems     Objective:   Physical Exam        Assessment & Plan:

## 2022-08-24 NOTE — Progress Notes (Deleted)
Patient here for Hepatitis A vaccine. Vaccine Given IM by CMA. Another dose due in 6 months. MJB, MD

## 2022-08-31 DIAGNOSIS — R1084 Generalized abdominal pain: Secondary | ICD-10-CM | POA: Diagnosis not present

## 2022-08-31 DIAGNOSIS — M5459 Other low back pain: Secondary | ICD-10-CM | POA: Diagnosis not present

## 2022-08-31 DIAGNOSIS — M6281 Muscle weakness (generalized): Secondary | ICD-10-CM | POA: Diagnosis not present

## 2022-08-31 DIAGNOSIS — R2689 Other abnormalities of gait and mobility: Secondary | ICD-10-CM | POA: Diagnosis not present

## 2022-09-04 ENCOUNTER — Other Ambulatory Visit: Payer: Self-pay | Admitting: Internal Medicine

## 2022-09-07 DIAGNOSIS — M5459 Other low back pain: Secondary | ICD-10-CM | POA: Diagnosis not present

## 2022-09-07 DIAGNOSIS — M6281 Muscle weakness (generalized): Secondary | ICD-10-CM | POA: Diagnosis not present

## 2022-09-07 DIAGNOSIS — R2689 Other abnormalities of gait and mobility: Secondary | ICD-10-CM | POA: Diagnosis not present

## 2022-09-07 DIAGNOSIS — R1084 Generalized abdominal pain: Secondary | ICD-10-CM | POA: Diagnosis not present

## 2022-09-10 ENCOUNTER — Other Ambulatory Visit: Payer: Self-pay | Admitting: Internal Medicine

## 2022-09-11 ENCOUNTER — Other Ambulatory Visit: Payer: Self-pay | Admitting: Internal Medicine

## 2022-09-18 ENCOUNTER — Other Ambulatory Visit: Payer: Self-pay | Admitting: Internal Medicine

## 2022-09-21 ENCOUNTER — Ambulatory Visit (INDEPENDENT_AMBULATORY_CARE_PROVIDER_SITE_OTHER): Payer: Medicare PPO

## 2022-09-21 DIAGNOSIS — R55 Syncope and collapse: Secondary | ICD-10-CM

## 2022-09-22 LAB — CUP PACEART REMOTE DEVICE CHECK
Date Time Interrogation Session: 20231023234958
Implantable Pulse Generator Implant Date: 20230919

## 2022-10-05 ENCOUNTER — Inpatient Hospital Stay: Admission: RE | Admit: 2022-10-05 | Payer: Medicare PPO | Source: Ambulatory Visit

## 2022-10-06 ENCOUNTER — Ambulatory Visit
Admission: RE | Admit: 2022-10-06 | Discharge: 2022-10-06 | Disposition: A | Discharge status: Home / Self Care | Payer: Medicare PPO | Source: Ambulatory Visit | Attending: Internal Medicine | Admitting: Internal Medicine

## 2022-10-06 DIAGNOSIS — Z1231 Encounter for screening mammogram for malignant neoplasm of breast: Secondary | ICD-10-CM | POA: Diagnosis not present

## 2022-10-12 DIAGNOSIS — Z6834 Body mass index (BMI) 34.0-34.9, adult: Secondary | ICD-10-CM | POA: Diagnosis not present

## 2022-10-12 DIAGNOSIS — Z1211 Encounter for screening for malignant neoplasm of colon: Secondary | ICD-10-CM | POA: Diagnosis not present

## 2022-10-12 DIAGNOSIS — Z01419 Encounter for gynecological examination (general) (routine) without abnormal findings: Secondary | ICD-10-CM | POA: Diagnosis not present

## 2022-10-12 DIAGNOSIS — L900 Lichen sclerosus et atrophicus: Secondary | ICD-10-CM | POA: Diagnosis not present

## 2022-10-12 DIAGNOSIS — Z13820 Encounter for screening for osteoporosis: Secondary | ICD-10-CM | POA: Diagnosis not present

## 2022-10-12 DIAGNOSIS — Z1231 Encounter for screening mammogram for malignant neoplasm of breast: Secondary | ICD-10-CM | POA: Diagnosis not present

## 2022-10-12 NOTE — Progress Notes (Signed)
Carelink Summary Report / Loop Recorder 

## 2022-10-14 ENCOUNTER — Telehealth: Payer: Self-pay | Admitting: Internal Medicine

## 2022-10-14 NOTE — Telephone Encounter (Signed)
scheduled

## 2022-10-14 NOTE — Telephone Encounter (Signed)
Nancy Wagner [phone]  Naphtali called to say her ankles are swollen, left one is worse than right, they are so bad she can hardly walk, her knees also hurt and she said her hand are swollen some with the left one has some numbness. She has had this since she returned from her trip late October, just been getting worse. She would like to come in and see you when she can.

## 2022-10-15 ENCOUNTER — Other Ambulatory Visit: Payer: Medicare PPO

## 2022-10-15 DIAGNOSIS — M25472 Effusion, left ankle: Secondary | ICD-10-CM | POA: Diagnosis not present

## 2022-10-15 DIAGNOSIS — R200 Anesthesia of skin: Secondary | ICD-10-CM | POA: Diagnosis not present

## 2022-10-15 DIAGNOSIS — M25569 Pain in unspecified knee: Secondary | ICD-10-CM

## 2022-10-15 DIAGNOSIS — M25471 Effusion, right ankle: Secondary | ICD-10-CM

## 2022-10-15 DIAGNOSIS — Z8739 Personal history of other diseases of the musculoskeletal system and connective tissue: Secondary | ICD-10-CM | POA: Diagnosis not present

## 2022-10-16 ENCOUNTER — Encounter: Payer: Self-pay | Admitting: Internal Medicine

## 2022-10-16 ENCOUNTER — Ambulatory Visit (HOSPITAL_COMMUNITY)
Admission: RE | Admit: 2022-10-16 | Discharge: 2022-10-16 | Disposition: A | Discharge status: Home / Self Care | Payer: Medicare PPO | Source: Ambulatory Visit | Attending: Internal Medicine | Admitting: Internal Medicine

## 2022-10-16 ENCOUNTER — Ambulatory Visit: Payer: Medicare PPO | Admitting: Internal Medicine

## 2022-10-16 ENCOUNTER — Telehealth: Payer: Self-pay

## 2022-10-16 VITALS — BP 118/70 | HR 73 | Temp 98.8°F | Ht 63.0 in | Wt 189.8 lb

## 2022-10-16 DIAGNOSIS — E1169 Type 2 diabetes mellitus with other specified complication: Secondary | ICD-10-CM

## 2022-10-16 DIAGNOSIS — E785 Hyperlipidemia, unspecified: Secondary | ICD-10-CM

## 2022-10-16 DIAGNOSIS — M7989 Other specified soft tissue disorders: Secondary | ICD-10-CM | POA: Diagnosis not present

## 2022-10-16 DIAGNOSIS — M79662 Pain in left lower leg: Secondary | ICD-10-CM | POA: Diagnosis not present

## 2022-10-16 DIAGNOSIS — I10 Essential (primary) hypertension: Secondary | ICD-10-CM

## 2022-10-16 DIAGNOSIS — Z8739 Personal history of other diseases of the musculoskeletal system and connective tissue: Secondary | ICD-10-CM | POA: Diagnosis not present

## 2022-10-16 DIAGNOSIS — Z87898 Personal history of other specified conditions: Secondary | ICD-10-CM | POA: Diagnosis not present

## 2022-10-16 DIAGNOSIS — G5603 Carpal tunnel syndrome, bilateral upper limbs: Secondary | ICD-10-CM

## 2022-10-16 DIAGNOSIS — I441 Atrioventricular block, second degree: Secondary | ICD-10-CM | POA: Diagnosis not present

## 2022-10-16 LAB — COMPLETE METABOLIC PANEL WITH GFR
AG Ratio: 1.1 (calc) (ref 1.0–2.5)
ALT: 17 U/L (ref 6–29)
AST: 22 U/L (ref 10–35)
Albumin: 4.2 g/dL (ref 3.6–5.1)
Alkaline phosphatase (APISO): 48 U/L (ref 37–153)
BUN/Creatinine Ratio: 13 (calc) (ref 6–22)
BUN: 18 mg/dL (ref 7–25)
CO2: 31 mmol/L (ref 20–32)
Calcium: 10.3 mg/dL (ref 8.6–10.4)
Chloride: 102 mmol/L (ref 98–110)
Creat: 1.35 mg/dL — ABNORMAL HIGH (ref 0.60–1.00)
Globulin: 3.7 g/dL (calc) (ref 1.9–3.7)
Glucose, Bld: 117 mg/dL (ref 65–139)
Potassium: 4.4 mmol/L (ref 3.5–5.3)
Sodium: 142 mmol/L (ref 135–146)
Total Bilirubin: 0.4 mg/dL (ref 0.2–1.2)
Total Protein: 7.9 g/dL (ref 6.1–8.1)
eGFR: 42 mL/min/{1.73_m2} — ABNORMAL LOW (ref >=60)

## 2022-10-16 LAB — CBC WITH DIFFERENTIAL/PLATELET
Absolute Monocytes: 547 cells/uL (ref 200–950)
Basophils Absolute: 58 cells/uL (ref 0–200)
Basophils Relative: 0.8 %
Eosinophils Absolute: 238 cells/uL (ref 15–500)
Eosinophils Relative: 3.3 %
HCT: 34.3 % — ABNORMAL LOW (ref 35.0–45.0)
Hemoglobin: 11.1 g/dL — ABNORMAL LOW (ref 11.7–15.5)
Lymphs Abs: 1930 cells/uL (ref 850–3900)
MCH: 30.2 pg (ref 27.0–33.0)
MCHC: 32.4 g/dL (ref 32.0–36.0)
MCV: 93.2 fL (ref 80.0–100.0)
MPV: 11.8 fL (ref 7.5–12.5)
Monocytes Relative: 7.6 %
Neutro Abs: 4428 cells/uL (ref 1500–7800)
Neutrophils Relative %: 61.5 %
Platelets: 232 10*3/uL (ref 140–400)
RBC: 3.68 10*6/uL — ABNORMAL LOW (ref 3.80–5.10)
RDW: 13 % (ref 11.0–15.0)
Total Lymphocyte: 26.8 %
WBC: 7.2 10*3/uL (ref 3.8–10.8)

## 2022-10-16 LAB — BRAIN NATRIURETIC PEPTIDE: Brain Natriuretic Peptide: 17 pg/mL (ref <100)

## 2022-10-16 LAB — T4, FREE: Free T4: 1 ng/dL (ref 0.8–1.8)

## 2022-10-16 LAB — TSH: TSH: 1.56 mIU/L (ref 0.40–4.50)

## 2022-10-16 MED ORDER — CYCLOBENZAPRINE HCL 10 MG PO TABS: 10.0000 mg | ORAL_TABLET | Freq: Every day | ORAL | 2 refills | Status: DC

## 2022-10-16 NOTE — Progress Notes (Signed)
Left lower extremity venous duplex has been completed. Preliminary results can be found in CV Proc through chart review.  Results were given to [facility] at Dr. Verlene Mayer office.  10/16/22 1:41 PM Nancy Wagner RVT
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
## 2022-10-16 NOTE — Telephone Encounter (Signed)
Marya Amsler called from [facility] and reported patient DVT is negative.

## 2022-10-16 NOTE — Telephone Encounter (Signed)
Patient was informed of message

## 2022-10-16 NOTE — Patient Instructions (Addendum)
Patient sent to heart and vascular center to have Doppler of the left lower extremity.  Further instructions to follow.  Patient has bilateral carpal tunnel syndrome.  Patient reports insomnia with recent symptoms.  Has tried some leftover Flexeril that she had from her previous prescription and slept better.  I have refilled Flexeril for her to take at bedtime.

## 2022-10-21 ENCOUNTER — Ambulatory Visit (INDEPENDENT_AMBULATORY_CARE_PROVIDER_SITE_OTHER): Payer: Medicare PPO

## 2022-10-21 DIAGNOSIS — R55 Syncope and collapse: Secondary | ICD-10-CM

## 2022-10-23 LAB — CUP PACEART REMOTE DEVICE CHECK
Date Time Interrogation Session: 20231123225224
Implantable Pulse Generator Implant Date: 20230919

## 2022-10-26 DIAGNOSIS — G5603 Carpal tunnel syndrome, bilateral upper limbs: Secondary | ICD-10-CM | POA: Diagnosis not present

## 2022-11-11 NOTE — Progress Notes (Signed)
Carelink Summary Report / Loop Recorder 

## 2022-11-13 DIAGNOSIS — G5601 Carpal tunnel syndrome, right upper limb: Secondary | ICD-10-CM | POA: Diagnosis not present

## 2022-11-16 ENCOUNTER — Telehealth: Payer: Self-pay

## 2022-11-16 NOTE — Telephone Encounter (Signed)
Pt states her monitor turned orange. I had her unplug the monitor and plug it back in. The monitor was green again.

## 2022-11-24 ENCOUNTER — Ambulatory Visit (INDEPENDENT_AMBULATORY_CARE_PROVIDER_SITE_OTHER): Payer: Medicare PPO

## 2022-11-24 DIAGNOSIS — I441 Atrioventricular block, second degree: Secondary | ICD-10-CM | POA: Diagnosis not present

## 2022-11-24 LAB — CUP PACEART REMOTE DEVICE CHECK
Date Time Interrogation Session: 20231224225359
Implantable Pulse Generator Implant Date: 20230919

## 2022-11-26 DIAGNOSIS — G8918 Other acute postprocedural pain: Secondary | ICD-10-CM | POA: Diagnosis not present

## 2022-11-26 DIAGNOSIS — G5601 Carpal tunnel syndrome, right upper limb: Secondary | ICD-10-CM | POA: Diagnosis not present

## 2022-12-04 ENCOUNTER — Other Ambulatory Visit: Payer: Self-pay | Admitting: Internal Medicine

## 2022-12-07 DIAGNOSIS — M25641 Stiffness of right hand, not elsewhere classified: Secondary | ICD-10-CM | POA: Diagnosis not present

## 2022-12-10 NOTE — Progress Notes (Signed)
Cardiology Office Note:    Date:  12/11/2022   ID:  Nancy Wagner, DOB 05/12/1951, MRN [id number]  PCP:  Nancy Showers, MD  [facility] Cardiologist:  Nancy Lean, MD  [place] Electrophysiologist:  None   CC: Blood pressure and heart block f/u  History of Present Illness:    Nancy Wagner is a 72 y.o. female with a hx of Obesity, DM with HTN, HLD who presents for evaluation 01/01/21.  Prior distant [place] 2022: patient had echo and ziopatch. Had vagotonic II HB; planned to stop atenolol but patient was unable to be reached X 3.  Got voicemail and stopped atenolol and immediately have SBP 190.  Did not call in but returned to atenolol.No medication changes at that time:  we discussed heart block symptoms and that if they occurred she would need a new anti-hypertensive regimen.  2023: syncope while driving; saw EP, s/p ILR  Patient notes that she is doing well.   Since last visit notes that she went to [place] and [place] and felt great. She had one episode of near syncope during this trip after getting off a 15 hour flight.  Did not pass out.  This event was post ILR.  Took a cruise down the [place]. She recounts the syncope episode as her just falling.   No chest pain or pressure .  No SOB/DOE and no PND/Orthopnea.  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   GERD (gastroesophageal reflux disease)    diet control   Gout    12-08-2019  per pt last episode left foot 11/ 2020   Hirsutism    History of obstructive sleep apnea    s/p  UPPP in 1985   Hypertension    followed by pcp  (12-08-2019  per pt had stress test many yrs ago, was told ok)   Lichen simplex chronicus    OA (osteoarthritis)    RA (rheumatoid arthritis) ([place])    12-08-2019  per pt was dx years ago, treated with high level prednisone for few weeks and some other  treatment with her eyes and has been in remission since   Type 2 diabetes mellitus ([place])    followed by pcp ---  (12-08-2019 checks cbg's daily in am,  fasting-- 110 to 126)   Uterine fibroid    Wears glasses     Past Surgical History:  Procedure Laterality Date   [place]  last one 05-09-2018   DILATATION & CURRETTAGE/HYSTEROSCOPY WITH RESECTOCOPE N/A 04/07/2017   Procedure: [place];  Surgeon: Nancy Manges, MD;  Location: [place];  Service: Gynecology;  Laterality: N/A;   ROBOTIC ASSISTED SALPINGO OOPHERECTOMY Bilateral 12/14/2019   Procedure: XI ROBOTIC ASSISTED SALPINGO OOPHORECTOMY;  Surgeon: Nancy Bern, MD;  Location: [facility];  Service: Gynecology;  Laterality: Bilateral;  Bed held, but patient is expected to go home the same day. -ap   THROAT SURGERY  1985   minimal uvulopalatopharyngoplasty for sleep apnea   TUBAL LIGATION Bilateral 1981    Current Medications: Current Meds  Medication Sig   albuterol (VENTOLIN HFA) 108 (90 Base) MCG/ACT inhaler TAKE 2 PUFFS BY MOUTH EVERY 6 HOURS AS NEEDED FOR WHEEZE OR SHORTNESS OF BREATH   allopurinol (ZYLOPRIM)  100 MG tablet TAKE 1 TABLET BY MOUTH EVERY DAY   amLODipine (NORVASC) 10 MG tablet Take 1 tablet (10 mg total) by mouth daily.   Blood Glucose Monitoring Suppl (ACCU-CHEK GUIDE ME) w/Device KIT USE AS DIRECTED   cetirizine (ZYRTEC) 10 MG tablet Take 10 mg by mouth as needed.   Cholecalciferol (VITAMIN D3) 50 MCG (2000 UT) TABS Take 1 tablet by mouth daily.   cloNIDine (CATAPRES) 0.1 MG tablet TAKE 1 TABLET BY MOUTH 2 TIMES DAILY.   cyclobenzaprine (FLEXERIL) 10 MG tablet Take 1 tablet (10 mg total) by mouth at bedtime. (Patient taking differently: Take 10 mg by mouth as needed for muscle spasms.)   esomeprazole (NEXIUM) 40 MG capsule TAKE 1 CAPSULE BY MOUTH EVERY DAY   fish oil-omega-3 fatty acids 1000  MG capsule Take 2 g by mouth at bedtime. Reported on 7/82/9562   folic acid (FOLVITE) 130 MCG tablet Take 400 mcg by mouth at bedtime.    glipiZIDE (GLUCOTROL XL) 10 MG 24 hr tablet TAKE 1 TABLET (10 MG TOTAL) BY MOUTH DAILY WITH BREAKFAST.   JANUMET XR [id number] MG TB24 TAKE 1 TABLET BY MOUTH EVERY DAY WITH BREAKFAST   olmesartan-hydrochlorothiazide (BENICAR HCT) 40-25 MG tablet TAKE 1 TABLET BY MOUTH EVERY DAY   simvastatin (ZOCOR) 40 MG tablet TAKE 1 TABLET BY MOUTH EVERYDAY AT BEDTIME     Allergies:   Adhesive [tape], Codeine, and Other   Social History   Socioeconomic History   Marital status: Married    Spouse name: Not on file   Number of children: 2   Years of education: Not on file   Highest education level: Not on file  Occupational History   Not on file  Tobacco Use   Smoking status: Never   Smokeless tobacco: Never  Vaping Use   Vaping Use: Never used  Substance and Sexual Activity   Alcohol use: Yes    Comment: very rare   Drug use: Never   Sexual activity: Not on file  Other Topics Concern   Not on file  Social History Narrative   Not on file   Social Determinants of Health   Financial Resource Strain: Not on file  Food Insecurity: Not on file  Transportation Needs: Not on file  Physical Activity: Not on file  Stress: Not on file  Social Connections: Not on file    Social: she celebrated her 50th wedding anniversary 2022; she is the oldest of 63 Went to [place] in 2023 and going to [place] in 2024  Family History: History of coronary artery disease notable for father. History of heart failure notable for no members. History of arrhythmia notable for no members. Sister has and aunt had heart problems of some sort.  ROS:   Please see the history of present illness.     All other systems reviewed and are negative.  EKGs/Labs/Other Studies Reviewed:    The following studies were reviewed today:  EKG:   12/11/2022: SR with rare PVCs Rare  72 10/13/21: SR with PACs  01/01/21: NSR rate 70 WNL 12/11/19: NSR 64 WNL  Cardiac Studies & Procedures       ECHOCARDIOGRAM  ECHOCARDIOGRAM COMPLETE 01/22/2021  Narrative ECHOCARDIOGRAM REPORT    Patient Name:   Nancy Wagner Date of Exam: 01/22/2021 Medical Rec #:  [id number]      Height:       63.0 in Accession #:    [id number]     Weight:  195.0 lb Date of Birth:  1951/05/28     BSA:          1.913 m Patient Age:    54 years       BP:           120/60 mmHg Patient Gender: F              HR:           62 bpm. Exam Location:  [place]  Procedure: 2D Echo, 3D Echo, Cardiac Doppler and Color Doppler  Indications:    R01.1 Murmur  History:        Patient has no prior history of Echocardiogram examinations. Signs/Symptoms:Dyspnea and Murmur; Risk Factors:Family History of Coronary Artery Disease, Hypertension, Diabetes and Dyslipidemia. Rheumatoid Arthritis, Palpitations.  Sonographer:    Deliah Boston RDCS Referring Phys: [id number] [facility] A Winston Misner  IMPRESSIONS   1. Left ventricular ejection fraction, by estimation, is 60 to 65%. The left ventricle has normal function. The left ventricle has no regional wall motion abnormalities. There is mild asymmetric left ventricular hypertrophy of the basal-septal segment. Left ventricular diastolic parameters are consistent with Grade I diastolic dysfunction (impaired relaxation). Elevated left atrial pressure. The E/e' is 25. 2. Right ventricular systolic function is normal. The right ventricular size is normal. There is normal pulmonary artery systolic pressure. 3. The mitral valve is normal in structure. Trivial mitral valve regurgitation. 4. The aortic valve is tricuspid. There is mild calcification of the aortic valve. There is mild thickening of the aortic valve. Aortic valve regurgitation is not visualized. Mild aortic valve sclerosis is present, with no evidence of aortic valve stenosis. 5. Aortic dilatation  noted. There is mild dilatation of the ascending aorta, measuring 36 mm. 6. The inferior vena cava is normal in size with greater than 50% respiratory variability, suggesting right atrial pressure of 3 mmHg.  Comparison(s): No prior Echocardiogram.  FINDINGS Left Ventricle: Left ventricular ejection fraction, by estimation, is 60 to 65%. The left ventricle has normal function. The left ventricle has no regional wall motion abnormalities. The left ventricular internal cavity size was normal in size. There is mild asymmetric left ventricular hypertrophy of the basal-septal segment. Left ventricular diastolic parameters are consistent with Grade I diastolic dysfunction (impaired relaxation). Elevated left atrial pressure. The E/e' is 42.  Right Ventricle: The right ventricular size is normal. No increase in right ventricular wall thickness. Right ventricular systolic function is normal. There is normal pulmonary artery systolic pressure. The tricuspid regurgitant velocity is 2.02 m/s, and with an assumed right atrial pressure of 3 mmHg, the estimated right ventricular systolic pressure is 45.4 mmHg.  Left Atrium: Left atrial size was normal in size.  Right Atrium: Right atrial size was normal in size.  Pericardium: There is no evidence of pericardial effusion.  Mitral Valve: The mitral valve is normal in structure. There is mild thickening of the mitral valve leaflet(s). There is mild calcification of the mitral valve leaflet(s). Mild mitral annular calcification. Trivial mitral valve regurgitation.  Tricuspid Valve: The tricuspid valve is normal in structure. Tricuspid valve regurgitation is trivial.  Aortic Valve: The aortic valve is tricuspid. There is mild calcification of the aortic valve. There is mild thickening of the aortic valve. Aortic valve regurgitation is not visualized. Mild aortic valve sclerosis is present, with no evidence of aortic valve stenosis.  Pulmonic Valve: The  pulmonic valve was normal in structure. Pulmonic valve regurgitation is not visualized.  Aorta: Aortic dilatation noted. There is  mild dilatation of the ascending aorta, measuring 36 mm.  Venous: The inferior vena cava is normal in size with greater than 50% respiratory variability, suggesting right atrial pressure of 3 mmHg.  IAS/Shunts: No atrial level shunt detected by color flow Doppler.   LEFT VENTRICLE PLAX 2D LVIDd:         4.60 cm  Diastology LVIDs:         2.70 cm  LV e' medial:    5.87 cm/s LV PW:         1.20 cm  LV E/e' medial:  17.2 LV IVS:        0.80 cm  LV e' lateral:   5.11 cm/s LVOT diam:     2.00 cm  LV E/e' lateral: 19.8 LV SV:         83 LV SV Index:   44 LVOT Area:     3.14 cm  3D Volume EF: 3D EF:        63 % LV EDV:       135 ml LV ESV:       50 ml LV SV:        85 ml  RIGHT VENTRICLE RV S prime:     12.00 cm/s TAPSE (M-mode): 2.2 cm  LEFT ATRIUM             Index       RIGHT ATRIUM           Index LA diam:        4.00 cm 2.09 cm/m  RA Area:     11.30 cm LA Vol (A2C):   75.4 ml 39.42 ml/m RA Volume:   25.70 ml  13.43 ml/m LA Vol (A4C):   53.1 ml 27.76 ml/m LA Biplane Vol: 63.8 ml 33.35 ml/m AORTIC VALVE LVOT Vmax:   109.00 cm/s LVOT Vmean:  70.800 cm/s LVOT VTI:    0.265 m  AORTA Ao Root diam: 2.90 cm Ao Asc diam:  3.60 cm  MITRAL VALVE                TRICUSPID VALVE MV Area (PHT)  cm          TR Peak grad:   16.3 mmHg MV Decel Time: 317 msec     TR Vmax:        202.00 cm/s MV E velocity: 101.00 cm/s MV A velocity: 112.00 cm/s  SHUNTS MV E/A ratio:  0.90         Systemic VTI:  0.26 m Systemic Diam: 2.00 cm  Gwyndolyn Kaufman MD Electronically signed by Gwyndolyn Kaufman MD Signature Date/Time: 01/22/2021/4:44:45 PM    Final    MONITORS  LONG TERM MONITOR (3-14 DAYS) 02/12/2021  Narrative  Patient had a minimum heart rate of 49 bpm, maximum heart rate of 150 bpm, and average heart rate of 66 bpm through two devices.   Computer algorithm reads heart rates of 29 and 37 bpm; this is based on the dropped beat of a Mobitz Type I heart block.  Predominant underlying rhythm was sinus rhythm.  One run of non-sustained ventricular tachycardia occurred lasting 4 beats at longest with a max rate of 150 bpm at fastest.  Isolated PACs were rare (<1.0%), with rare couplets or triplets present.  Isolated PVCs were rare (<1.0%), with rare couplets present.  There is Mobitz Type I heart block and nocturnal Mobitz Type II heart block (01/13/21 4:28 AM).  No triggered and diary events.  Asymptomatic Mobitz I &  II heart block.             Recent Labs: 10/15/2022: ALT 17; Brain Natriuretic Peptide 17; BUN 18; Creat 1.35; Hemoglobin 11.1; Platelets 232; Potassium 4.4; Sodium 142; TSH 1.56  Recent Lipid Panel    Component Value Date/Time   CHOL 151 06/15/2022 0922   TRIG 92 06/15/2022 0922   HDL 59 06/15/2022 0922   CHOLHDL 2.6 06/15/2022 0922   VLDL 18 02/08/2017 1121   LDLCALC 74 06/15/2022 0922    Physical Exam:    VS:  BP 120/68   Pulse 74   Ht '5\' 3"'$  (1.6 m)   Wt 182 lb (82.6 kg)   SpO2 96%   BMI 32.24 kg/m     Wt Readings from Last 3 Encounters:  12/11/22 182 lb (82.6 kg)  10/16/22 189 lb 12.8 oz (86.1 kg)  08/18/22 190 lb 3.2 oz (86.3 kg)    Gen: no  distress Neck: No JVD Cardiac: No rubs or gallops, no murmur, normal rate, +2 radial pulses Respiratory: Clear to auscultation bilaterally, normal effort, normal  respiratory rate GI: Soft, nontender, non-distended  MS: No edema;  moves all extremities Integument: Skin feels warm Neuro:  At time of evaluation, alert and oriented to person/place/time/situation  Psych: Normal affect, patient feels well, thought tired   ASSESSMENT:    1. Diabetes mellitus with coincident hypertension ([place])   2. Mixed hyperlipidemia   3. Mobitz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   4. PVC (premature ventricular contraction)     PLAN:    HTN and DM -  controled on Norvasc 10 mg - Olmesartan and HCTZ (40-20 mg) - patient is on clonidine 0.1 BID; if worsening Bradycardia stop clonidine and add hydralazine BID  HLD Aortic atherosclerosis - continue current statin  -deferred CAC score - LDL < 70 she has lab f/u soon   Mobitz Type I and Type II nocturnal HB Rare PVC - s/p ILR; will not add AV nodal agent  One year follow up    Medication Adjustments/Labs and Tests Ordered: Current medicines are reviewed at length with the patient today.  Concerns regarding medicines are outlined above.  Orders Placed This Encounter  Procedures   EKG 12-Lead    No orders of the defined types were placed in this encounter.    Patient Instructions  Medication Instructions:  Your physician recommends that you continue on your current medications as directed. Please refer to the Current Medication list given to you today.  *If you need a refill on your cardiac medications before your next appointment, please call your pharmacy*   Lab Work: NONE If you have labs (blood work) drawn today and your tests are completely normal, you will receive your results only by: [place] (if you have MyChart) OR A paper copy in the mail If you have any lab test that is abnormal or we need to change your treatment, we will call you to review the results.   Testing/Procedures: NONE   Follow-Up: At [facility], you and your health needs are our priority.  As part of our continuing mission to provide you with exceptional heart care, we have created designated Provider Care Teams.  These Care Teams include your primary Cardiologist (physician) and Advanced Practice Providers (APPs -  Physician Assistants and Nurse Practitioners) who all work together to provide you with the care you need, when you need it.  We recommend signing up for the patient portal called "MyChart".  Sign up  information is provided on this After Visit Summary.  MyChart is  used to connect with patients for Virtual Visits (Telemedicine).  Patients are able to view lab/test results, encounter notes, upcoming appointments, etc.  Non-urgent messages can be sent to your provider as well.   To learn more about what you can do with MyChart, go to [email or web address].    Your next appointment:   1 year(s)  Provider:   Werner Lean, MD       Signed, Nancy Lean, MD  12/11/2022 9:04 AM    [place]

## 2022-12-11 ENCOUNTER — Ambulatory Visit: Payer: Medicare PPO | Attending: Internal Medicine | Admitting: Internal Medicine

## 2022-12-11 ENCOUNTER — Encounter: Payer: Self-pay | Admitting: Internal Medicine

## 2022-12-11 VITALS — BP 120/68 | HR 74 | Ht 63.0 in | Wt 182.0 lb

## 2022-12-11 DIAGNOSIS — I10 Essential (primary) hypertension: Secondary | ICD-10-CM | POA: Diagnosis not present

## 2022-12-11 DIAGNOSIS — E785 Hyperlipidemia, unspecified: Secondary | ICD-10-CM

## 2022-12-11 DIAGNOSIS — E1169 Type 2 diabetes mellitus with other specified complication: Secondary | ICD-10-CM | POA: Diagnosis not present

## 2022-12-11 DIAGNOSIS — E119 Type 2 diabetes mellitus without complications: Secondary | ICD-10-CM

## 2022-12-11 DIAGNOSIS — I493 Ventricular premature depolarization: Secondary | ICD-10-CM | POA: Diagnosis not present

## 2022-12-11 DIAGNOSIS — I700 Atherosclerosis of aorta: Secondary | ICD-10-CM

## 2022-12-11 DIAGNOSIS — I441 Atrioventricular block, second degree: Secondary | ICD-10-CM

## 2022-12-11 DIAGNOSIS — E782 Mixed hyperlipidemia: Secondary | ICD-10-CM

## 2022-12-11 NOTE — Patient Instructions (Signed)
Medication Instructions:  Your physician recommends that you continue on your current medications as directed. Please refer to the Current Medication list given to you today.  *If you need a refill on your cardiac medications before your next appointment, please call your pharmacy*   Lab Work: NONE If you have labs (blood work) drawn today and your tests are completely normal, you will receive your results only by: [place] (if you have MyChart) OR A paper copy in the mail If you have any lab test that is abnormal or we need to change your treatment, we will call you to review the results.   Testing/Procedures: NONE   Follow-Up: At [facility], you and your health needs are our priority.  As part of our continuing mission to provide you with exceptional heart care, we have created designated Provider Care Teams.  These Care Teams include your primary Cardiologist (physician) and Advanced Practice Providers (APPs -  Physician Assistants and Nurse Practitioners) who all work together to provide you with the care you need, when you need it.  We recommend signing up for the patient portal called "MyChart".  Sign up information is provided on this After Visit Summary.  MyChart is used to connect with patients for Virtual Visits (Telemedicine).  Patients are able to view lab/test results, encounter notes, upcoming appointments, etc.  Non-urgent messages can be sent to your provider as well.   To learn more about what you can do with MyChart, go to [email or web address].    Your next appointment:   1 year(s)  Provider:   Werner Lean, MD

## 2022-12-16 NOTE — Progress Notes (Signed)
[facility] Summary Report / Loop Recorder

## 2022-12-21 ENCOUNTER — Other Ambulatory Visit: Payer: Medicare PPO

## 2022-12-21 DIAGNOSIS — E785 Hyperlipidemia, unspecified: Secondary | ICD-10-CM | POA: Diagnosis not present

## 2022-12-21 DIAGNOSIS — I10 Essential (primary) hypertension: Secondary | ICD-10-CM | POA: Diagnosis not present

## 2022-12-21 DIAGNOSIS — E119 Type 2 diabetes mellitus without complications: Secondary | ICD-10-CM

## 2022-12-21 DIAGNOSIS — E1169 Type 2 diabetes mellitus with other specified complication: Secondary | ICD-10-CM | POA: Diagnosis not present

## 2022-12-22 LAB — HEPATIC FUNCTION PANEL
AG Ratio: 1.1 (calc) (ref 1.0–2.5)
ALT: 22 U/L (ref 6–29)
AST: 21 U/L (ref 10–35)
Albumin: 4.2 g/dL (ref 3.6–5.1)
Alkaline phosphatase (APISO): 48 U/L (ref 37–153)
Bilirubin, Direct: 0.1 mg/dL (ref 0.0–0.2)
Globulin: 4 g/dL (calc) — ABNORMAL HIGH (ref 1.9–3.7)
Indirect Bilirubin: 0.3 mg/dL (calc) (ref 0.2–1.2)
Total Bilirubin: 0.4 mg/dL (ref 0.2–1.2)
Total Protein: 8.2 g/dL — ABNORMAL HIGH (ref 6.1–8.1)

## 2022-12-22 LAB — LIPID PANEL
Cholesterol: 143 mg/dL (ref <200)
HDL: 62 mg/dL (ref >=50)
LDL Cholesterol (Calc): 63 mg/dL (calc)
Non-HDL Cholesterol (Calc): 81 mg/dL (calc) (ref <130)
Total CHOL/HDL Ratio: 2.3 (calc) (ref <5.0)
Triglycerides: 96 mg/dL (ref <150)

## 2022-12-22 LAB — MICROALBUMIN / CREATININE URINE RATIO
Creatinine, Urine: 139 mg/dL (ref 20–275)
Microalb Creat Ratio: 5 mcg/mg creat (ref <30)
Microalb, Ur: 0.7 mg/dL

## 2022-12-22 LAB — HEMOGLOBIN A1C
Hgb A1c MFr Bld: 7.3 % of total Hgb — ABNORMAL HIGH (ref <5.7)
Mean Plasma Glucose: 163 mg/dL
eAG (mmol/L): 9 mmol/L

## 2022-12-24 DIAGNOSIS — M79641 Pain in right hand: Secondary | ICD-10-CM | POA: Diagnosis not present

## 2022-12-24 NOTE — Progress Notes (Addendum)
Subjective:    Patient ID: Nancy Wagner , female    DOB: 03-06-1951, 72 y.o.    MRN: [id number]   72 y.o. Female presents today for  6 month follow-up visit. She has a hx of HTN.   She has a history of Rheumatoid Arthritis.Being seen by Dr. Trudie Reed   History of bilateral carpal tunnel syndrome. Initially seen by Dr. Leanora Cover. Most recently seen by [facility] on January 8th after having right carpal tunnel release December 2023.  She is wearing a flexible elastic wrist support on the left wrist.  She has a history of Diabetes mellitus. She is taking glipizide 10 mg with breakfast. She is taking Janumet XR [phone] mg with breakfast. Her HGBA1C measured 7.3 % today. She reports eating well and losing weight. She has lost 10 lbs since May 2023. She reports that she skips lunch regularly due to lack of appetite but otherwise eating well.  Hx of sleep apnea.  Denies recent syncope or blackout spells.Hx of syncope while at a rest stop in [place]. Had extensive evaluation. She is followed by Cardiology. She has a loop recorder in place which is still active.Hx of Mobitz 2 second degree heart block. Hx Mobitz 1 block also.   She will need an MRI with contrast this year to check a cystic lesion on her pancreas. This has been ordered.    She has a history of asthma. Her symptoms are well controlled. She has an Albuterol inhaler to use as needed.  She has a history of cervical spinal stenosis. She is taking cyclobenzaprine 10 mg at bedtime which relieves discomfort.  She has a history of GERD. She is taking Omeprazole 40 mg once daily with good symptom control.  She has a history of hypertension. She is taking clonidine 0.1 mg twice daily. She is taking Benicar 40-'25mg'$  once daily. She is taking Amlodipine 10 mg daily. Her blood pressure has been well controlled.  History of hyperlipidemia. She is taking simvastatin 40 mg daily. Lipid panel done December 21, 2022 was normal.   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   GERD (gastroesophageal reflux disease)    diet control   Gout    12-08-2019  per pt last episode left foot 11/ 2020   Hirsutism    History of obstructive sleep apnea    s/p  UPPP in 1985   Hypertension    followed by pcp  (12-08-2019  per pt had stress test many yrs ago, was told ok)   Lichen simplex chronicus    OA (osteoarthritis)    RA (rheumatoid arthritis) ([place])    12-08-2019  per pt was dx years ago, treated with high level prednisone for few weeks and some other treatment with her eyes and has been in remission since   Type 2 diabetes mellitus ([place])    followed by pcp ---  (12-08-2019 checks cbg's daily in am,  fasting-- 110 to 126)   Uterine fibroid    Wears glasses         Patient Care Team: Elby Showers, MD as PCP - General (Internal Medicine) Werner Lean, MD as PCP - Cardiology (Cardiology)   Review of Systems  Constitutional:  Negative for fever and malaise/fatigue.  HENT:  Negative for congestion.   Eyes:  Negative for blurred vision.  Respiratory:  Negative for cough and shortness of breath.   Cardiovascular:  Negative for chest pain, palpitations and leg swelling.  Gastrointestinal:  Negative for vomiting.  Musculoskeletal:  Negative for back pain.  Skin:  Negative for rash.  Neurological:  Negative for loss of consciousness and headaches.        Objective:   Vitals: BP 124/62 pulse 81 T 98.4 degrees pulse ox 96% Weight 183 lb 12.8 oz BMI 32.56   Physical Exam Vitals and nursing note reviewed. Exam conducted with a chaperone present.  Constitutional:      General: She is not in acute distress.    Appearance: Normal appearance. She is not ill-appearing or toxic-appearing.  HENT:     Head:  Normocephalic and atraumatic.     Right Ear: Hearing, tympanic membrane, ear canal and external ear normal.     Left Ear: Hearing, tympanic membrane, ear canal and external ear normal.     Mouth/Throat:     Pharynx: Oropharynx is clear.  Eyes:     Extraocular Movements: Extraocular movements intact.     Pupils: Pupils are equal, round, and reactive to light.  Neck:     Thyroid: No thyroid mass, thyromegaly or thyroid tenderness.     Vascular: No carotid bruit.  Cardiovascular:     Rate and Rhythm: Normal rate and regular rhythm. No extrasystoles are present.    Pulses: Normal pulses.          Dorsalis pedis pulses are 2+ on the right side and 2+ on the left side.       Posterior tibial pulses are 2+ on the right side and 2+ on the left side.     Heart sounds: Normal heart sounds. No murmur heard.    No friction rub. No gallop.  Pulmonary:     Effort: Pulmonary effort is normal.     Breath sounds: Normal breath sounds. No decreased breath sounds, wheezing, rhonchi or rales.  Chest:     Chest wall: No mass.  Abdominal:     Palpations: Abdomen is soft. There is no hepatomegaly, splenomegaly or mass.     Tenderness: There is no abdominal tenderness.     Hernia: No hernia is present.  Musculoskeletal:     Cervical back: Normal range of motion.     Right lower leg: No edema.     Left lower leg: No edema.  Lymphadenopathy:     Cervical: No cervical adenopathy.     Upper Body:     Right upper body: No supraclavicular adenopathy.     Left upper body: No supraclavicular adenopathy.  Skin:    General: Skin is warm and dry.  Neurological:     General: No focal deficit present.     Mental Status: She is alert and oriented to person, place, and time. Mental status is at baseline.     Sensory: Sensation is intact.     Motor: Motor function is intact. No weakness.     Deep Tendon Reflexes: Reflexes are normal and symmetric.  Psychiatric:        Attention and Perception: Attention normal.         Mood and Affect: Mood normal.        Speech: Speech normal.        Behavior: Behavior normal.        Thought Content: Thought content  normal.        Cognition and Memory: Cognition normal.        Judgment: Judgment normal.     Results:   Studies obtained and personally reviewed by me:  MRI abdomen 01/13/2021: IMPRESSION: 1. Stable 8 mm subcapsular hypervascular lesion in the posterior liver dome. As before, this lesion is really only visible on delayed postcontrast imaging. Although the finding remains indeterminate, interval stability is reassuring and this is almost assuredly a benign finding. 2. 6 mm simple cystic lesion in the tail of pancreas is stable in the interval. Follow-up MRI in 2 years recommended. This recommendation follows ACR consensus guidelines: Management of Incidental pancreatic Cysts: A White Paper of the ACR Incidental Findings Committee. [place] 2952;84:[id number].    Assessment & Plan:   Diabetes Mellitus: She is taking glipizide 10 mg with breakfast. She is taking Janumet XR [phone] mg with breakfast. Patient will work on diet.Hgb AIC is 7.3% and was 7% in July 2023. Needs to work on exercise.  Hypertension: She is taking clonidine 0.1 mg twice daily.She is on amlodipine '10mg'$  daily. She is taking Benicar 40-'25mg'$  once daily. Blood pressures at goal. Continue current regimen.  Hyperlipidemia: She is taking simvastatin 40 mg at bedtime.Lipid panel is normal. LFTs normal.  Pancreatic cyst: Last seen on MRI abdomen from 01/13/2021. She will need an MRI this year to check a cystic lesion on her pancreas.  GERD treated with PPI  Right carpal tunnel release recently. Also has left carpal tunnel syndrome  Vaccine counseling: She needs her second Hep-A dose this Spring.  BMI 32- continue diet and exercise regimen  Sleep apnea   I,Alexander Ruley,acting as a scribe for Elby Showers, MD.,have documented all relevant documentation on the behalf  of Elby Showers, MD,as directed by  Elby Showers, MD while in the presence of Elby Showers, MD. I, Elby Showers, MD, have reviewed all documentation for this visit. The documentation on 12/25/22 for the exam, diagnosis, procedures, and orders are all accurate and complete.  IElby Showers, MD, have reviewed all documentation for this visit. The documentation on 12/25/22 for the exam, diagnosis, procedures, and orders are all accurate and complete.

## 2022-12-25 ENCOUNTER — Encounter: Payer: Self-pay | Admitting: Internal Medicine

## 2022-12-25 ENCOUNTER — Encounter: Payer: Self-pay | Admitting: Gastroenterology

## 2022-12-25 ENCOUNTER — Ambulatory Visit: Payer: Medicare PPO | Admitting: Internal Medicine

## 2022-12-25 VITALS — BP 124/62 | HR 81 | Temp 98.4°F | Ht 63.0 in | Wt 183.8 lb

## 2022-12-25 DIAGNOSIS — Z8739 Personal history of other diseases of the musculoskeletal system and connective tissue: Secondary | ICD-10-CM

## 2022-12-25 DIAGNOSIS — I10 Essential (primary) hypertension: Secondary | ICD-10-CM

## 2022-12-25 DIAGNOSIS — K862 Cyst of pancreas: Secondary | ICD-10-CM

## 2022-12-25 DIAGNOSIS — Z87898 Personal history of other specified conditions: Secondary | ICD-10-CM | POA: Diagnosis not present

## 2022-12-25 DIAGNOSIS — K869 Disease of pancreas, unspecified: Secondary | ICD-10-CM | POA: Diagnosis not present

## 2022-12-25 DIAGNOSIS — E1169 Type 2 diabetes mellitus with other specified complication: Secondary | ICD-10-CM

## 2022-12-25 DIAGNOSIS — G4730 Sleep apnea, unspecified: Secondary | ICD-10-CM | POA: Diagnosis not present

## 2022-12-25 DIAGNOSIS — I441 Atrioventricular block, second degree: Secondary | ICD-10-CM

## 2022-12-25 DIAGNOSIS — E785 Hyperlipidemia, unspecified: Secondary | ICD-10-CM

## 2022-12-25 DIAGNOSIS — K769 Liver disease, unspecified: Secondary | ICD-10-CM

## 2022-12-25 DIAGNOSIS — Z6832 Body mass index (BMI) 32.0-32.9, adult: Secondary | ICD-10-CM | POA: Diagnosis not present

## 2022-12-25 NOTE — Patient Instructions (Addendum)
Have ordered follow up abdominal MRI. Needs to work on diet and exercise. Would like to see Hgb AIC less than 7%. RTC in 6 months and will be due for CPE and Medicare wellness exam.

## 2022-12-28 ENCOUNTER — Ambulatory Visit: Payer: Medicare PPO | Attending: Cardiology

## 2022-12-28 DIAGNOSIS — R55 Syncope and collapse: Secondary | ICD-10-CM

## 2022-12-29 LAB — CUP PACEART REMOTE DEVICE CHECK
Date Time Interrogation Session: 20240128232332
Implantable Pulse Generator Implant Date: 20230919

## 2022-12-30 DIAGNOSIS — M79641 Pain in right hand: Secondary | ICD-10-CM | POA: Diagnosis not present

## 2022-12-31 ENCOUNTER — Telehealth: Payer: Self-pay

## 2022-12-31 NOTE — Progress Notes (Signed)
Left message for patient to return call to office at [phone].  

## 2022-12-31 NOTE — Telephone Encounter (Signed)
Multiply attempts made calling patient,  a letter has been placed in the mail for patient to call the office to discuss scheduling MRI.

## 2023-01-04 NOTE — Telephone Encounter (Signed)
Patient returned call and I gave her the number to call and schedule MRI of abdomen that Dr Renold Genta ordered.

## 2023-01-05 NOTE — Progress Notes (Signed)
Spoke with husband. Pt received letter. He will have her call the office to schedule the MRI

## 2023-01-06 DIAGNOSIS — M79641 Pain in right hand: Secondary | ICD-10-CM | POA: Diagnosis not present

## 2023-01-10 ENCOUNTER — Encounter (HOSPITAL_COMMUNITY): Payer: Self-pay | Admitting: Emergency Medicine

## 2023-01-10 ENCOUNTER — Emergency Department (HOSPITAL_BASED_OUTPATIENT_CLINIC_OR_DEPARTMENT_OTHER): Payer: Medicare PPO

## 2023-01-10 ENCOUNTER — Emergency Department (HOSPITAL_COMMUNITY)
Admission: EM | Admit: 2023-01-10 | Discharge: 2023-01-10 | Disposition: A | Discharge status: Home / Self Care | Payer: Medicare PPO | Attending: Emergency Medicine | Admitting: Emergency Medicine

## 2023-01-10 ENCOUNTER — Emergency Department (HOSPITAL_COMMUNITY): Payer: Medicare PPO

## 2023-01-10 ENCOUNTER — Ambulatory Visit
Admission: EM | Admit: 2023-01-10 | Discharge: 2023-01-10 | Disposition: A | Discharge status: Home / Self Care | Payer: Medicare PPO

## 2023-01-10 DIAGNOSIS — M109 Gout, unspecified: Secondary | ICD-10-CM | POA: Insufficient documentation

## 2023-01-10 DIAGNOSIS — R2242 Localized swelling, mass and lump, left lower limb: Secondary | ICD-10-CM

## 2023-01-10 DIAGNOSIS — M79672 Pain in left foot: Secondary | ICD-10-CM | POA: Diagnosis not present

## 2023-01-10 DIAGNOSIS — M79605 Pain in left leg: Secondary | ICD-10-CM

## 2023-01-10 DIAGNOSIS — M79661 Pain in right lower leg: Secondary | ICD-10-CM | POA: Diagnosis present

## 2023-01-10 DIAGNOSIS — M7989 Other specified soft tissue disorders: Secondary | ICD-10-CM | POA: Diagnosis not present

## 2023-01-10 DIAGNOSIS — Z7984 Long term (current) use of oral hypoglycemic drugs: Secondary | ICD-10-CM | POA: Diagnosis not present

## 2023-01-10 DIAGNOSIS — E119 Type 2 diabetes mellitus without complications: Secondary | ICD-10-CM | POA: Insufficient documentation

## 2023-01-10 LAB — CREATININE, SERUM
Creatinine, Ser: 1.32 mg/dL — ABNORMAL HIGH (ref 0.44–1.00)
GFR, Estimated: 43 mL/min — ABNORMAL LOW (ref >60)

## 2023-01-10 MED ORDER — PREDNISONE 10 MG PO TABS: ORAL_TABLET | ORAL | 0 refills | Status: AC

## 2023-01-10 MED ORDER — COLCHICINE 0.6 MG PO TABS: 1.2000 mg | ORAL_TABLET | Freq: Once | ORAL | Status: DC

## 2023-01-10 MED ORDER — PREDNISONE 20 MG PO TABS
40.0000 mg | ORAL_TABLET | Freq: Once | ORAL | Status: AC
  Administered 2023-01-10: 40 mg via ORAL
  Filled 2023-01-10: qty 2

## 2023-01-10 MED ORDER — COLCHICINE 0.6 MG PO TABS: 0.6000 mg | ORAL_TABLET | Freq: Every day | ORAL | 0 refills | Status: DC

## 2023-01-10 MED ORDER — OXYCODONE-ACETAMINOPHEN 5-325 MG PO TABS
1.0000 | ORAL_TABLET | Freq: Once | ORAL | Status: AC
  Administered 2023-01-10: 1 via ORAL
  Filled 2023-01-10: qty 1

## 2023-01-10 NOTE — ED Provider Notes (Signed)
[facility] Provider Note   CSN: [id number] Arrival date & time: 01/10/23  1338     History  Chief Complaint  Patient presents with   Leg Pain    Racquell Freeburn is a 72 y.o. female, history of diabetes, who presents to the ED secondary to right lower extremity pain, swelling for the last couple days.  She states that she was walking couple days ago, and she started to feel like her muscles were being pressed and pulled on on the right foot, she states is progressively got worse, and then because of pain in her right calf.  She denies any trauma, no redness, does state it is a little bit more swollen.  Is not on any blood thinners.  Denies any coolness, to the extremity.  Reports she has a history of gout, but typically does not get in that area.  Has tried ice with some relief.    Home Medications Prior to Admission medications   Medication Sig Start Date End Date Taking? Authorizing Provider  predniSONE (DELTASONE) 10 MG tablet Take 4 tablets (40 mg total) by mouth daily for 3 days, THEN 3 tablets (30 mg total) daily for 2 days, THEN 2 tablets (20 mg total) daily for 2 days, THEN 1 tablet (10 mg total) daily for 1 day. 01/10/23 01/18/23 Yes Briahna Pescador L, PA  albuterol (VENTOLIN HFA) 108 (90 Base) MCG/ACT inhaler TAKE 2 PUFFS BY MOUTH EVERY 6 HOURS AS NEEDED FOR WHEEZE OR SHORTNESS OF BREATH 09/11/22   Elby Showers, MD  allopurinol (ZYLOPRIM) 100 MG tablet TAKE 1 TABLET BY MOUTH EVERY DAY 06/09/22   Elby Showers, MD  amLODipine (NORVASC) 10 MG tablet Take 1 tablet (10 mg total) by mouth daily. 04/23/22   [name], PA-C  Blood Glucose Monitoring Suppl (ACCU-CHEK GUIDE ME) w/Device KIT USE AS DIRECTED 04/16/20   Elby Showers, MD  cetirizine (ZYRTEC) 10 MG tablet Take 10 mg by mouth as needed.    [provider]  Cholecalciferol (VITAMIN D3) 50 MCG (2000 UT) TABS Take 1 tablet by mouth daily.    [provider]   cloNIDine (CATAPRES) 0.1 MG tablet TAKE 1 TABLET BY MOUTH 2 TIMES DAILY. 02/12/22   Elby Showers, MD  cyclobenzaprine (FLEXERIL) 10 MG tablet Take 1 tablet (10 mg total) by mouth at bedtime. Patient taking differently: Take 10 mg by mouth as needed for muscle spasms. 10/16/22   Elby Showers, MD  esomeprazole (NEXIUM) 40 MG capsule TAKE 1 CAPSULE BY MOUTH EVERY DAY 09/18/22   Elby Showers, MD  fish oil-omega-3 fatty acids 1000 MG capsule Take 2 g by mouth at bedtime. Reported on 06/18/2016 Taking 1 tablet    [provider]  folic acid (FOLVITE) [phone] MCG tablet Take 400 mcg by mouth at bedtime.     [provider]  glipiZIDE (GLUCOTROL XL) 10 MG 24 hr tablet TAKE 1 TABLET (10 MG TOTAL) BY MOUTH DAILY WITH BREAKFAST. 03/29/22   Elby Showers, MD  JANUMET XR [phone] MG TB24 TAKE 1 TABLET BY MOUTH EVERY DAY WITH BREAKFAST 03/29/22   Elby Showers, MD  olmesartan-hydrochlorothiazide (BENICAR HCT) 40-25 MG tablet TAKE 1 TABLET BY MOUTH EVERY DAY 09/04/22   Elby Showers, MD  simvastatin (ZOCOR) 40 MG tablet TAKE 1 TABLET BY MOUTH EVERYDAY AT BEDTIME 12/04/22   Elby Showers, MD      Allergies    Adhesive [tape],  Codeine, and Other    Review of Systems   Review of Systems  Respiratory:  Negative for shortness of breath.   Cardiovascular:  Positive for leg swelling. Negative for chest pain.    Physical Exam Updated Vital Signs BP (!) 147/61 (BP Location: Right Arm)   Pulse 82   Temp 98.1 F (36.7 C)   Resp 18   SpO2 97%  Physical Exam Vitals and nursing note reviewed.  Constitutional:      General: She is not in acute distress.    Appearance: She is well-developed.  HENT:     Head: Normocephalic and atraumatic.  Eyes:     Conjunctiva/sclera: Conjunctivae normal.  Cardiovascular:     Rate and Rhythm: Normal rate and regular rhythm.     Pulses: Normal pulses.          Dorsalis pedis pulses are 2+ on the right side and 2+ on the left side.     Heart sounds: No  murmur heard. Pulmonary:     Effort: Pulmonary effort is normal. No respiratory distress.     Breath sounds: Normal breath sounds.  Abdominal:     Palpations: Abdomen is soft.     Tenderness: There is no abdominal tenderness.  Musculoskeletal:        General: No swelling.     Cervical back: Neck supple.     Comments: Right leg/ankle/foot: Diffuse tenderness to dorsal aspect of right foot, including midfoot, and lateral and medial malleolus.  There is edema to the ankle and foot, 1+.  And tenderness to palpation upon the venous system of the right calf.  Negative Homans' sign. Able able to bear weight. Able to plantar flex and dorsiflex ankle. Inversion/eversion intact. Negative Thompson test. No midfoot tor base of 5th metatarsal tenderness to palpation. Capillary refill <2sec. Dorsalis pedis pulse present. No foot drop noted. Sensation intact. Warm to touch.    Skin:    General: Skin is warm and dry.     Capillary Refill: Capillary refill takes less than 2 seconds.     Comments: No erythema.  Neurological:     Mental Status: She is alert.  Psychiatric:        Mood and Affect: Mood normal.     ED Results / Procedures / Treatments   Labs (all labs ordered are listed, but only abnormal results are displayed) Labs Reviewed  CREATININE, SERUM - Abnormal; Notable for the following components:      Result Value   Creatinine, Ser 1.32 (*)    GFR, Estimated 43 (*)    All other components within normal limits    EKG None  Radiology VAS [place] LOWER EXTREMITY VENOUS (DVT)  Result Date: 01/10/2023  Lower Venous DVT Study Patient Name:  DARIELIS HUDLOW  Date of Exam:   01/10/2023 Medical Rec #: [id number]       Accession #:    [id number] Date of Birth: May 14, 1951      Patient Gender: F Patient Age:   38 years Exam Location:  [facility] Procedure:      VAS [place] LOWER EXTREMITY VENOUS (DVT) Referring Phys: ADAM CURATOLO  --------------------------------------------------------------------------------  Indications: Foot and heel pain.  Comparison Study: Prior negative left LEV done 10/16/2022 Performing Technologist: Sharion Dove RVS  Examination Guidelines: A complete evaluation includes B-mode imaging, spectral Doppler, color Doppler, and power Doppler as needed of all accessible portions of each vessel. Bilateral testing is considered an integral part of a complete examination. Limited examinations for reoccurring  indications may be performed as noted. The reflux portion of the exam is performed with the patient in reverse Trendelenburg.  +-----+---------------+---------+-----------+----------+--------------+ RIGHTCompressibilityPhasicitySpontaneityPropertiesThrombus Aging +-----+---------------+---------+-----------+----------+--------------+ CFV  Full           Yes      Yes                                 +-----+---------------+---------+-----------+----------+--------------+   +---------+---------------+---------+-----------+----------+--------------+ LEFT     CompressibilityPhasicitySpontaneityPropertiesThrombus Aging +---------+---------------+---------+-----------+----------+--------------+ CFV      Full           Yes      Yes                                 +---------+---------------+---------+-----------+----------+--------------+ SFJ      Full                                                        +---------+---------------+---------+-----------+----------+--------------+ FV Prox  Full                                                        +---------+---------------+---------+-----------+----------+--------------+ FV Mid   Full                                                        +---------+---------------+---------+-----------+----------+--------------+ FV DistalFull                                                         +---------+---------------+---------+-----------+----------+--------------+ PFV      Full                                                        +---------+---------------+---------+-----------+----------+--------------+ POP      Full           Yes      Yes                                 +---------+---------------+---------+-----------+----------+--------------+ PTV      Full                                                        +---------+---------------+---------+-----------+----------+--------------+ PERO     Full                                                        +---------+---------------+---------+-----------+----------+--------------+  Gastroc  Full                                                        +---------+---------------+---------+-----------+----------+--------------+ SSV      Full                                                        +---------+---------------+---------+-----------+----------+--------------+    Summary: RIGHT: - No evidence of common femoral vein obstruction.  LEFT: - There is no evidence of deep vein thrombosis in the lower extremity.  - No cystic structure found in the popliteal fossa.  *See table(s) above for measurements and observations.    Preliminary    DG Foot Complete Left  Result Date: 01/10/2023 CLINICAL DATA:  Pain and swelling of left foot EXAM: LEFT FOOT - COMPLETE 3 VIEW COMPARISON:  Left toe radiograph March 18, 2012 FINDINGS: No acute fracture or dislocation. There is mild erosive appearance of the medial aspect of the head of the first metatarsal. There is overlying soft tissues swelling and mineral deposition within the soft tissues. Mild midfoot and ankle degenerative changes. IMPRESSION: Joycie Aerts erosion in the medial aspect of the head of the first metatarsal, with overlying soft tissue swelling and mineralization, suggestive of gout. Electronically Signed   By: Beryle Flock M.D.   On: 01/10/2023 15:01     Procedures Procedures    Medications Ordered in ED Medications  oxyCODONE-acetaminophen (PERCOCET/ROXICET) 5-325 MG per tablet 1 tablet (1 tablet Oral Given 01/10/23 1606)  predniSONE (DELTASONE) tablet 40 mg (40 mg Oral Given 01/10/23 1700)    ED Course/ Medical Decision Making/ A&P                             Medical Decision Making Patient is a 72 year old female, here for left foot swelling and left calf pain.  We will obtain an x-ray, of her foot, and then a Doppler as she has some left calf pain, and venous tenderness.  She does have some edema of this.  Amount and/or Complexity of Data Reviewed Labs: ordered.    Details: Creatinine as seen above Radiology: ordered.    Details: X-ray concerning for possible gout, ultrasound negative for DVT Discussion of management or test interpretation with external provider(s): Discussed with patient, findings concerning for gout, creatinine initially ordered for colchicine versus anti-inflammatory dosing, patient declines either, she would like to have prednisone for her pain control/treatment of acute gout flare, she is on allopurinol and has been compliant.  I discussed this will likely cause her sugars to increase, she is okay with this, and voiced understanding.  Return precautions emphasized, she is able to ambulate, and she has good pulse on exam.  Return precautions emphasized.  Risk Prescription drug management.    Final Clinical Impression(s) / ED Diagnoses Final diagnoses:  Acute gout of left foot, unspecified cause    Rx / DC Orders ED Discharge Orders          Ordered    colchicine 0.6 MG tablet  Daily,   Status:  Discontinued        01/10/23 1642  predniSONE (DELTASONE) 10 MG tablet  Daily        01/10/23 1651              Harm Jou Carlean Jews, PA 01/10/23 1834    Lennice Sites, DO 01/10/23 1910

## 2023-01-10 NOTE — ED Provider Notes (Signed)
[facility]    CSN: [id number] Arrival date & time: 01/10/23  1137      History   Chief Complaint Chief Complaint  Patient presents with   Foot Pain    HPI Nancy Wagner is a 72 y.o. female.   Patient presents with left lower leg/ankle/foot pain that has been present for 4 days.  She also reports that it has been swollen.  Denies any injury to the area.  Reports history of gout but states that this feels similar as her gout is typically in her great toe.  Has she taken Goody powders with minimal improvement of symptoms.  Denies any numbness or tingling.  Denies any associated fever.  Denies any recent long distance travel in a plane or car.  Denies history of DVT or blood clot.   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   GERD (gastroesophageal reflux disease)    diet control   Gout    12-08-2019  per pt last episode left foot 11/ 2020   Hirsutism    History of obstructive sleep apnea    s/p  UPPP in 1985   Hypertension    followed by pcp  (12-08-2019  per pt had stress test many yrs ago, was told ok)   Lichen simplex chronicus    OA (osteoarthritis)    RA (rheumatoid arthritis) ([place])    12-08-2019  per pt was dx years ago, treated with high level prednisone for few weeks and some other treatment with her eyes and has been in remission since   Type 2 diabetes mellitus ([place])    followed by pcp ---  (12-08-2019 checks cbg's daily in am,  fasting-- 110 to 126)   Uterine fibroid    Wears glasses     Patient Active Problem List   Diagnosis Date Noted   Diabetes mellitus with coincident hypertension ([place]) 12/11/2022   [place]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 12/11/2022   PVC (premature ventricular contraction) 12/11/2022   Aortic atherosclerosis ([place]) 12/11/2022   Pancreatic lesion 01/21/2021    Hyperandrogenism 12/14/2019   Elevated testosterone level 09/20/2018   Dysproteinemia 04/20/2018   PMB (postmenopausal bleeding) 04/04/2017   Abnormal transvaginal ultrasound 04/04/2017   Hyperlipidemia associated with type 2 diabetes mellitus ([place]) 07/12/2014   Controlled diabetes mellitus type II without complication ([place]) Q000111Q   Metabolic syndrome Q000111Q   History of vitamin D deficiency 08/24/2013   Mixed hyperlipidemia 08/24/2013   Osteoarthritis of left knee 08/24/2013   Menopausal state 04/26/2012   Diabetes mellitus type 2 in obese ([place]) 03/15/2012    Past Surgical History:  Procedure Laterality Date   [place]  last one 05-09-2018   DILATATION & CURRETTAGE/HYSTEROSCOPY WITH RESECTOCOPE N/A 04/07/2017   Procedure: DILATATION & CURETTAGE/HYSTEROSCOPY WITH RESECTOCOPE;  Surgeon: Eldred Manges, MD;  Location: [place];  Service: Gynecology;  Laterality: N/A;   ROBOTIC ASSISTED SALPINGO OOPHERECTOMY Bilateral 12/14/2019   Procedure: XI ROBOTIC ASSISTED SALPINGO OOPHORECTOMY;  Surgeon: Delsa Bern, MD;  Location: [facility];  Service: Gynecology;  Laterality: Bilateral;  Bed held, but patient is expected to go home the same day. -ap   THROAT SURGERY  1985   minimal uvulopalatopharyngoplasty for sleep apnea   TUBAL  LIGATION Bilateral 1981    OB History     Gravida  2   Para  2   Term  2   Preterm  0   AB  0   Living  2      SAB  0   IAB  0   Ectopic  0   Multiple  0   Live Births               Home Medications    Prior to Admission medications   Medication Sig Start Date End Date Taking? Authorizing Provider  albuterol (VENTOLIN HFA) 108 (90 Base) MCG/ACT inhaler TAKE 2 PUFFS BY MOUTH EVERY 6 HOURS AS NEEDED FOR WHEEZE OR SHORTNESS OF BREATH 09/11/22   Elby Showers, MD  allopurinol (ZYLOPRIM) 100 MG tablet TAKE 1 TABLET BY MOUTH EVERY DAY 06/09/22   Elby Showers, MD  amLODipine (NORVASC) 10 MG tablet Take 1 tablet (10 mg total) by mouth daily. 04/23/22   [name], PA-C  Blood Glucose Monitoring Suppl (ACCU-CHEK GUIDE ME) w/Device KIT USE AS DIRECTED 04/16/20   Elby Showers, MD  cetirizine (ZYRTEC) 10 MG tablet Take 10 mg by mouth as needed.    [provider]  Cholecalciferol (VITAMIN D3) 50 MCG (2000 UT) TABS Take 1 tablet by mouth daily.    [provider]  cloNIDine (CATAPRES) 0.1 MG tablet TAKE 1 TABLET BY MOUTH 2 TIMES DAILY. 02/12/22   Elby Showers, MD  cyclobenzaprine (FLEXERIL) 10 MG tablet Take 1 tablet (10 mg total) by mouth at bedtime. Patient taking differently: Take 10 mg by mouth as needed for muscle spasms. 10/16/22   Elby Showers, MD  esomeprazole (NEXIUM) 40 MG capsule TAKE 1 CAPSULE BY MOUTH EVERY DAY 09/18/22   Elby Showers, MD  fish oil-omega-3 fatty acids 1000 MG capsule Take 2 g by mouth at bedtime. Reported on 06/18/2016 Taking 1 tablet    [provider]  folic acid (FOLVITE) [id number] MCG tablet Take 400 mcg by mouth at bedtime.     [provider]  glipiZIDE (GLUCOTROL XL) 10 MG 24 hr tablet TAKE 1 TABLET (10 MG TOTAL) BY MOUTH DAILY WITH BREAKFAST. 03/29/22   Elby Showers, MD  JANUMET XR [phone] MG TB24 TAKE 1 TABLET BY MOUTH EVERY DAY WITH BREAKFAST 03/29/22   Elby Showers, MD  olmesartan-hydrochlorothiazide (BENICAR HCT) 40-25 MG tablet TAKE 1 TABLET BY MOUTH EVERY DAY 09/04/22   Elby Showers, MD  simvastatin (ZOCOR) 40 MG tablet TAKE 1 TABLET BY MOUTH EVERYDAY AT BEDTIME 12/04/22   Elby Showers, MD    Family History Family History  Problem Relation Age of Onset   Heart disease Sister     Social History Social History   Tobacco Use   Smoking status: Never   Smokeless tobacco: Never  Vaping Use   Vaping Use: Never used  Substance Use Topics   Alcohol use: Yes    Comment: very rare   Drug use: Never     Allergies   Adhesive [tape], Codeine, and  Other   Review of Systems Review of Systems Per HPI  Physical Exam Triage Vital Signs ED Triage Vitals  Enc Vitals Group     BP 01/10/23 1248 108/60     Pulse Rate 01/10/23 1248 75     Resp 01/10/23 1248 19     Temp 01/10/23 1248 97.9 F (36.6 C)     Temp Source 01/10/23  1248 Oral     SpO2 01/10/23 1248 98 %     Weight --      Height --      Head Circumference --      Peak Flow --      Pain Score 01/10/23 1247 10     Pain Loc --      Pain Edu? --      Excl. in [place]? --    No data found.  Updated Vital Signs BP 108/60   Pulse 75   Temp 97.9 F (36.6 C) (Oral)   Resp 19   SpO2 98%   Visual Acuity Right Eye Distance:   Left Eye Distance:   Bilateral Distance:    Right Eye Near:   Left Eye Near:    Bilateral Near:     Physical Exam Constitutional:      General: She is not in acute distress.    Appearance: Normal appearance. She is not toxic-appearing or diaphoretic.  HENT:     Head: Normocephalic and atraumatic.  Eyes:     Extraocular Movements: Extraocular movements intact.     Conjunctiva/sclera: Conjunctivae normal.  Pulmonary:     Effort: Pulmonary effort is normal.  Musculoskeletal:     Comments: She has tenderness to palpation that extends from left calf to left medial ankle to dorsal surface of left foot.  Mild swelling noted.  No obvious discoloration.  Capillary refill and pulses intact.  Patient has full range of motion of toes.  Neurological:     General: No focal deficit present.     Mental Status: She is alert and oriented to person, place, and time. Mental status is at baseline.  Psychiatric:        Mood and Affect: Mood normal.        Behavior: Behavior normal.        Thought Content: Thought content normal.        Judgment: Judgment normal.      UC Treatments / Results  Labs (all labs ordered are listed, but only abnormal results are displayed) Labs Reviewed - No data to display  EKG   Radiology No results  found.  Procedures Procedures (including critical care time)  Medications Ordered in UC Medications - No data to display  Initial Impression / Assessment and Plan / UC Course  I have reviewed the triage vital signs and the nursing notes.  Pertinent labs & imaging results that were available during my care of the patient were reviewed by me and considered in my medical decision making (see chart for details).     Given location of pain and swelling, I am concerned for DVT.  Do not have the ability to rule out DVT in urgent care as it is a weekend day and cannot order outpatient imaging.  Therefore, patient was advised to go to the ER for further evaluation and management and was agreeable with plan.  Vital signs and patient stable at discharge. Agree with patient self transport to the hospital. Final Clinical Impressions(s) / UC Diagnoses   Final diagnoses:  Left leg pain  Left foot pain  Localized swelling of left lower leg     Discharge Instructions      Please go to the ER as soon as you leave urgent care for further evaluation and management.    ED Prescriptions   None    PDMP not reviewed this encounter.   Teodora Medici, [place] 01/10/23 1339

## 2023-01-10 NOTE — Discharge Instructions (Addendum)
Please follow-up with your primary care doctor, your pain should start improving within 2 to 5 days, if your pain is worsening, you have redness, worsening swelling please return to the ER.

## 2023-01-10 NOTE — Discharge Instructions (Signed)
Please go to the ER as soon as you leave urgent care for further evaluation and management.  

## 2023-01-10 NOTE — ED Notes (Signed)
Patient is being discharged from the Urgent Care and sent to the Emergency Department via pov with husband . Per [place] np, patient is in need of higher level of care due to symptoms. Patient is aware and verbalizes understanding of plan of care.  Vitals:   01/10/23 1248  BP: 108/60  Pulse: 75  Resp: 19  Temp: 97.9 F (36.6 C)  SpO2: 98%

## 2023-01-10 NOTE — ED Triage Notes (Signed)
Pt presents to uc with co of left foot pain, swelling for 4 days and has progressively worsened. Pt has used goody powder which did help

## 2023-01-10 NOTE — ED Triage Notes (Signed)
Pt sent to the ED for r/o dvt in her left leg , foot is swollen and warm to touch , pain radiates up the leg but no pain in calf

## 2023-01-10 NOTE — Progress Notes (Signed)
VASCULAR LAB    Left lower extremity venous duplex has been performed.  See CV proc for preliminary results.   Amatullah Christy, RVT 01/10/2023, 4:05 PM

## 2023-01-11 ENCOUNTER — Telehealth: Payer: Self-pay

## 2023-01-11 NOTE — Telephone Encounter (Signed)
TOC 1 unsuccessful attempt

## 2023-01-11 NOTE — Transitions of Care (Post Inpatient/ED Visit) (Signed)
   01/11/2023  Name: Nancy Wagner MRN: [id number] DOB: 21-Apr-1951  Today's TOC FU Call Status: Today's TOC FU Call Status:: Successful TOC FU Call Competed  Transition Care Management Follow-up Telephone Call Date of Discharge: 01/10/23 Discharge Facility: [facility] Type of Discharge: Emergency Department Reason for ED Visit: Other: How have you been since you were released from the hospital?: Same Any questions or concerns?: No  Items Reviewed: Did you receive and understand the discharge instructions provided?: Yes Medications obtained and verified?: Yes (Medications Reviewed) Any new allergies since your discharge?: No Dietary orders reviewed?: NA Do you have support at home?: Yes People in Home: spouse, grandchild(ren)  [place] and Equipment/Supplies: [place] Ordered?: No Any new equipment or medical supplies ordered?: No  Functional Questionnaire: Do you need assistance with bathing/showering or dressing?: Yes Do you need assistance with meal preparation?: No Do you need assistance with eating?: No Do you have difficulty maintaining continence: No Do you need assistance with getting out of bed/getting out of a chair/moving?: No Do you have difficulty managing or taking your medications?: No  Folllow up appointments reviewed: PCP Follow-up appointment confirmed?: No (Patient stated she would call back to schedule if condition gets worse) MD Provider Line [phone] Given: Yes [facility] Follow-up appointment confirmed?: NA Do you need transportation to your follow-up appointment?: Yes Transportation Need Intervention Addressed By:: Other: Do you understand care options if your condition(s) worsen?: Yes-patient verbalized understanding    SIGNATURE Larnie Heart D. CMA

## 2023-01-14 NOTE — Progress Notes (Signed)
Authorization [id number]  MRI is scheduled for 01/18/23 at [facility]

## 2023-01-18 ENCOUNTER — Ambulatory Visit (HOSPITAL_COMMUNITY)
Admission: RE | Admit: 2023-01-18 | Discharge: 2023-01-18 | Disposition: A | Discharge status: Home / Self Care | Payer: Medicare PPO | Source: Ambulatory Visit | Attending: Internal Medicine | Admitting: Internal Medicine

## 2023-01-18 DIAGNOSIS — K7689 Other specified diseases of liver: Secondary | ICD-10-CM | POA: Diagnosis not present

## 2023-01-18 DIAGNOSIS — K869 Disease of pancreas, unspecified: Secondary | ICD-10-CM | POA: Insufficient documentation

## 2023-01-18 DIAGNOSIS — N289 Disorder of kidney and ureter, unspecified: Secondary | ICD-10-CM | POA: Diagnosis not present

## 2023-01-18 DIAGNOSIS — K862 Cyst of pancreas: Secondary | ICD-10-CM | POA: Diagnosis not present

## 2023-01-18 DIAGNOSIS — Z9049 Acquired absence of other specified parts of digestive tract: Secondary | ICD-10-CM | POA: Diagnosis not present

## 2023-01-18 MED ORDER — GADOBUTROL 1 MMOL/ML IV SOLN
9.0000 mL | Freq: Once | INTRAVENOUS | Status: AC | PRN
  Administered 2023-01-18: 9 mL via INTRAVENOUS

## 2023-01-24 ENCOUNTER — Other Ambulatory Visit: Payer: Self-pay | Admitting: Internal Medicine

## 2023-01-28 ENCOUNTER — Other Ambulatory Visit: Payer: Self-pay | Admitting: Internal Medicine

## 2023-01-28 DIAGNOSIS — M79641 Pain in right hand: Secondary | ICD-10-CM | POA: Diagnosis not present

## 2023-01-28 NOTE — Telephone Encounter (Signed)
2 attempts made calling patient, no answer and unable to leave voicemail

## 2023-02-01 ENCOUNTER — Ambulatory Visit (INDEPENDENT_AMBULATORY_CARE_PROVIDER_SITE_OTHER): Payer: Medicare PPO

## 2023-02-01 ENCOUNTER — Encounter: Payer: Self-pay | Admitting: Gastroenterology

## 2023-02-01 ENCOUNTER — Ambulatory Visit: Payer: Medicare PPO | Admitting: Gastroenterology

## 2023-02-01 VITALS — BP 110/58 | HR 76 | Ht 62.5 in | Wt 182.5 lb

## 2023-02-01 DIAGNOSIS — R55 Syncope and collapse: Secondary | ICD-10-CM | POA: Diagnosis not present

## 2023-02-01 DIAGNOSIS — K862 Cyst of pancreas: Secondary | ICD-10-CM | POA: Diagnosis not present

## 2023-02-01 LAB — CUP PACEART REMOTE DEVICE CHECK
Date Time Interrogation Session: 20240303232054
Implantable Pulse Generator Implant Date: 20230919

## 2023-02-01 NOTE — Telephone Encounter (Signed)
Attempted to call patient on all contact phone numbers and unable to reach or leave a voicemail

## 2023-02-01 NOTE — Patient Instructions (Addendum)
Follow up MRI 2026  _______________________________________________________  If your blood pressure at your visit was 140/90 or greater, please contact your primary care physician to follow up on this.  _______________________________________________________  If you are age 74 or older, your body mass index should be between 23-30. Your Body mass index is 32.85 kg/m. If this is out of the aforementioned range listed, please consider follow up with your Primary Care Provider.  If you are age 27 or younger, your body mass index should be between 19-25. Your Body mass index is 32.85 kg/m. If this is out of the aformentioned range listed, please consider follow up with your Primary Care Provider.   ________________________________________________________  The [facility] providers would like to encourage you to use [facility] to communicate with providers for non-urgent requests or questions.  Due to long hold times on the telephone, sending your provider a message by [facility] may be a faster and more efficient way to get a response.  Please allow 48 business hours for a response.  Please remember that this is for non-urgent requests.  _______________________________________________________ It was a pleasure to see you today!  Thank you for trusting me with your gastrointestinal care!

## 2023-02-01 NOTE — Telephone Encounter (Signed)
Patient returned call and stated she did not need the refill on Methlpredisolone

## 2023-02-01 NOTE — Progress Notes (Deleted)
[facility] progress note:  HistoryRhemi Wagner 02/01/2023  Referring provider: Elby Showers, MD  Reason for consult/chief complaint: No chief complaint on file.   Subjective  HPI: This is a patient of Dr. Ardis Hughs last seen by our PA February 2022 noting the following: "HISTORY OF PRESENT ILLNESS: This is a pleasant 72 year old female who is a patient of Dr. Ardis Hughs.  She is here for follow-up of her recent MRI.  She has a 6 mm cystic lesion in the tail of the pancreas.  This was stable on MRI from February 2019 to February 2020 and then now also stable February 2022.  She does not have any complaints today including abdominal pain, nausea, vomiting, etc.  Pancreatic cyst: Was stable on MRI from 2019-2020 and then now from 2020-2022.  Recommendation was that if it was stable for 5 years with no significant change then surveillance would no longer be needed.  We will plan for another MRI of the abdomen in February 2024 and if stable no further imaging from that time. " (Following 2015 AGA guidelines)  Carlene had a screening colonoscopy with Dr. Ardis Hughs in June 2019 (age 31), and no polyps were found.  10-year recall recommended. 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   GERD (gastroesophageal reflux disease)    diet control   Gout    12-08-2019  per pt last episode left foot 11/ 2020   Hirsutism    History of obstructive sleep apnea    s/p  UPPP in 1985   Hypertension    followed by pcp  (12-08-2019  per pt had stress test many yrs ago, was told ok)   Lichen simplex chronicus    OA (osteoarthritis)    RA (rheumatoid arthritis) ([place])    12-08-2019  per pt was dx years ago, treated with high level prednisone for few weeks and some other  treatment with her eyes and has been in remission since   Type 2 diabetes mellitus ([place])    followed by pcp ---  (12-08-2019 checks cbg's daily in am,  fasting-- 110 to 126)   Uterine fibroid    Wears glasses      Past Surgical History: Past Surgical History:  Procedure Laterality Date   [place]  last one 05-09-2018   DILATATION & CURRETTAGE/HYSTEROSCOPY WITH RESECTOCOPE N/A 04/07/2017   Procedure: [place];  Surgeon: Eldred Manges, MD;  Location: [place];  Service: Gynecology;  Laterality: N/A;   ROBOTIC ASSISTED SALPINGO OOPHERECTOMY Bilateral 12/14/2019   Procedure: XI ROBOTIC ASSISTED SALPINGO OOPHORECTOMY;  Surgeon: Delsa Bern, MD;  Location: [facility];  Service: Gynecology;  Laterality: Bilateral;  Bed held, but patient is expected to go home the same day. -ap   THROAT SURGERY  1985   minimal uvulopalatopharyngoplasty for sleep apnea   TUBAL LIGATION Bilateral 1981     Family History: Family History  Problem Relation Age of Onset   Heart disease Sister     Social History: Social History   Socioeconomic History   Marital status: Married    Spouse name: Not on file   Number of children: 2  Years of education: Not on file   Highest education level: Not on file  Occupational History   Not on file  Tobacco Use   Smoking status: Never   Smokeless tobacco: Never  Vaping Use   Vaping Use: Never used  Substance and Sexual Activity   Alcohol use: Yes    Comment: very rare   Drug use: Never   Sexual activity: Not on file  Other Topics Concern   Not on file  Social History Narrative   Not on file   Social Determinants of Health   Financial Resource Strain: Not on file  Food Insecurity: Not on file  Transportation Needs: No Transportation Needs (01/11/2023)   PRAPARE - Hydrologist (Medical): No    Lack of  Transportation (Non-Medical): No  Physical Activity: Not on file  Stress: Not on file  Social Connections: Not on file    Allergies: Allergies  Allergen Reactions   Adhesive [Tape] Rash    Band-Aid   Codeine Hives, Itching and Rash    Other Reaction(s): Not available   Other Rash    Outpatient Meds: Current Outpatient Medications  Medication Sig Dispense Refill   albuterol (VENTOLIN HFA) 108 (90 Base) MCG/ACT inhaler TAKE 2 PUFFS BY MOUTH EVERY 6 HOURS AS NEEDED FOR WHEEZE OR SHORTNESS OF BREATH 8.5 each prn   allopurinol (ZYLOPRIM) 100 MG tablet TAKE 1 TABLET BY MOUTH EVERY DAY 90 tablet 1   amLODipine (NORVASC) 10 MG tablet TAKE 1/2 TABLET BY MOUTH DAILY 90 tablet 2   Blood Glucose Monitoring Suppl (ACCU-CHEK GUIDE ME) w/Device KIT USE AS DIRECTED 1 kit prn   cetirizine (ZYRTEC) 10 MG tablet Take 10 mg by mouth as needed.     Cholecalciferol (VITAMIN D3) 50 MCG (2000 UT) TABS Take 1 tablet by mouth daily.     cloNIDine (CATAPRES) 0.1 MG tablet TAKE 1 TABLET BY MOUTH TWICE A DAY 180 tablet 3   cyclobenzaprine (FLEXERIL) 10 MG tablet Take 1 tablet (10 mg total) by mouth at bedtime. (Patient taking differently: Take 10 mg by mouth as needed for muscle spasms.) 30 tablet 2   esomeprazole (NEXIUM) 40 MG capsule TAKE 1 CAPSULE BY MOUTH EVERY DAY 90 capsule 1   fish oil-omega-3 fatty acids 1000 MG capsule Take 2 g by mouth at bedtime. Reported on 06/18/2016 Taking 1 tablet     folic acid (FOLVITE) A999333 MCG tablet Take 400 mcg by mouth at bedtime.      glipiZIDE (GLUCOTROL XL) 10 MG 24 hr tablet TAKE 1 TABLET (10 MG TOTAL) BY MOUTH DAILY WITH BREAKFAST. 90 tablet 3   JANUMET XR [phone] MG TB24 TAKE 1 TABLET BY MOUTH EVERY DAY WITH BREAKFAST 90 tablet 3   olmesartan-hydrochlorothiazide (BENICAR HCT) 40-25 MG tablet TAKE 1 TABLET BY MOUTH EVERY DAY 90 tablet 1   simvastatin (ZOCOR) 40 MG tablet TAKE 1 TABLET BY MOUTH EVERYDAY AT BEDTIME 90 tablet 3   No current facility-administered  medications for this visit.      ___________________________________________________________________ Objective   Exam:  There were no vitals taken for this visit. Wt Readings from Last 3 Encounters:  12/25/22 183 lb 12.8 oz (83.4 kg)  12/11/22 182 lb (82.6 kg)  10/16/22 189 lb 12.8 oz (86.1 kg)    General: ***  Eyes: sclera anicteric, no redness ENT: oral mucosa moist without lesions, no cervical or supraclavicular lymphadenopathy CV: ***, no JVD, no peripheral edema Resp: clear to auscultation bilaterally, normal RR and  effort noted GI: soft, *** tenderness, with active bowel sounds. No guarding or palpable organomegaly noted. Skin; warm and dry, no rash or jaundice noted Neuro: awake, alert and oriented x 3. Normal gross motor function and fluent speech  Labs:  ***  Radiologic Studies:  CLINICAL DATA:  Follow-up hepatic and pancreatic lesions.   EXAM: MRI ABDOMEN WITHOUT AND WITH CONTRAST   TECHNIQUE: Multiplanar multisequence MR imaging of the abdomen was performed both before and after the administration of intravenous contrast.   CONTRAST:  25m GADAVIST GADOBUTROL 1 MMOL/ML IV SOLN   COMPARISON:  Multiple priors including most recent MRI January 13, 2021   FINDINGS: Lower chest: No acute abnormality.   Hepatobiliary: Unchanged size of the 8 mm focus of delayed enhancement in the posterior dome of the liver segment VII image 40/904 this focus is isointense to background liver on T1 and T2.   Gallbladder surgically absent.  No biliary ductal dilation.   Pancreas: Cystic lesion in the pancreatic body/tail measures 5 mm on image 18/4 previously 6 mm, no suspicious postcontrast enhancement identified within the cyst. No pancreatic ductal dilation or evidence of acute inflammation.   Spleen:  Within normal limits in size and appearance.   Adrenals/Urinary Tract: Normal adrenal glands. Fluid signal 16 mm left interpolar renal lesion is compatible with a  cyst, considered benign and requires no independent imaging follow up. No evidence of hydronephrosis.   Stomach/Bowel: Visualized portions within the abdomen are unremarkable.   Vascular/Lymphatic: No pathologically enlarged lymph nodes identified. No abdominal aortic aneurysm demonstrated.   Other:  None.   Musculoskeletal: No suspicious bone lesions identified.   IMPRESSION: 1. Unchanged size of the 8 mm focus of delayed enhancement in the posterior dome of the liver segment VII. This focus is isointense to background liver on T1 and T2. Imaging findings which are nonspecific and remain indeterminate but interval stability is reassuring for a benign process 2. Stable subcentimeter cystic lesion in the pancreatic body/tail, without suspicious MRI features. 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 Recommend follow up pre and post contrast MRI/MRCP in 2 years.     Electronically Signed   By: JDahlia BailiffM.D.   On: 01/19/2023 08:00  Assessment: No diagnosis found.  ***  Plan:  ***  Thank you for the courtesy of this consult.  Please call me with any questions or concerns.  HNelida MeuseIII  CC: Referring provider noted above

## 2023-02-01 NOTE — Progress Notes (Signed)
[facility] progress note:  History: Nancy Wagner 02/01/2023  Referring provider: Elby Showers, MD  Reason for consult/chief complaint: Chief Complaint  Patient presents with   pancreatic lesion    Abnormal MRI, to discuss results      Subjective  HPI: This is a patient of Dr. Ardis Wagner last seen by our PA February 2022 noting the following: "HISTORY OF PRESENT ILLNESS: This is a pleasant 72 year old female who is a patient of Dr. Ardis Wagner.  She is here for follow-up of her recent MRI.  She has a 6 mm cystic lesion in the tail of the pancreas.  This was stable on MRI from February 2019 to February 2020 and then now also stable February 2022.  She does not have any complaints today including abdominal pain, nausea, vomiting, etc.  Pancreatic cyst: Was stable on MRI from 2019-2020 and then now from 2020-2022.  Recommendation was that if it was stable for 5 years with no significant change then surveillance would no longer be needed.  We will plan for another MRI of the abdomen in February 2024 and if stable no further imaging from that time. " (Following 2015 AGA guidelines)  Nancy Wagner had a screening colonoscopy with Dr. Ardis Wagner in June 2019 (age 29), and no polyps were found.  10-year recall recommended. _________________________  Today, she reports feeling well. She states experiencing some occasional abdominal and chest wall pain due to a fall she previously had. She states that when she does experiences the pain, it tends to be excruciating. She doesn't attribute her pain to any GI issues. She reports having a good appetite.  Denies nausea vomiting or dysphagia.  She states that her PCP has informed her that the MRI results were normal.   She denies any family history of pancreatic cancer.   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 She states that she loves traveling and has recently travelled to [place] and [place].       ROS:  Review of  Systems  Constitutional:  Negative for appetite change and fever.  HENT:  Negative for trouble swallowing.   Respiratory:  Negative for cough and shortness of breath.   Cardiovascular:  Negative for chest pain.  Gastrointestinal:  Negative for abdominal distention, abdominal pain, anal bleeding, blood in stool, constipation, diarrhea, nausea, rectal pain and vomiting.  Genitourinary:  Negative for dysuria.  Musculoskeletal:  Negative for back pain.  Skin:  Negative for rash.  Neurological:  Negative for weakness.  All other systems reviewed and are negative.    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   GERD (gastroesophageal reflux disease)    diet control   Gout    12-08-2019  per pt last episode left foot 11/ 2020   Hirsutism    History of obstructive sleep apnea    s/p  UPPP in 1985   Hypertension    followed by pcp  (12-08-2019  per pt had stress test many yrs ago, was told ok)   Lichen simplex chronicus    OA (osteoarthritis)    RA (rheumatoid arthritis) ([place])    12-08-2019  per pt was dx years ago, treated with high level prednisone for few weeks and some other treatment with her eyes and has been in remission since   Type 2  diabetes mellitus ([place])    followed by pcp ---  (12-08-2019 checks cbg's daily in am,  fasting-- 110 to 126)   Uterine fibroid    Wears glasses      Past Surgical History: Past Surgical History:  Procedure Laterality Date   CARPAL TUNNEL RELEASE Right    CESAREAN SECTION  1975, [place]   COLONOSCOPY  last one 05-09-2018   DILATATION & CURRETTAGE/HYSTEROSCOPY WITH RESECTOCOPE N/A 04/07/2017   Procedure: [place];  Surgeon: Eldred Manges, MD;  Location: [facility];  Service: Gynecology;  Laterality:  N/A;   ROBOTIC ASSISTED SALPINGO OOPHERECTOMY Bilateral 12/14/2019   Procedure: XI ROBOTIC ASSISTED SALPINGO OOPHORECTOMY;  Surgeon: Delsa Bern, MD;  Location: [facility];  Service: Gynecology;  Laterality: Bilateral;  Bed held, but patient is expected to go home the same day. -ap   THROAT SURGERY  1985   minimal uvulopalatopharyngoplasty for sleep apnea   TUBAL LIGATION Bilateral 1981     Family History: Family History  Problem Relation Age of Onset   Heart disease Sister     Social History: Social History   Socioeconomic History   Marital status: Married    Spouse name: Not on file   Number of children: 2   Years of education: Not on file   Highest education level: Not on file  Occupational History   Not on file  Tobacco Use   Smoking status: Never   Smokeless tobacco: Never  Vaping Use   Vaping Use: Never used  Substance and Sexual Activity   Alcohol use: Yes    Comment: very rare   Drug use: Never   Sexual activity: Not on file  Other Topics Concern   Not on file  Social History Narrative   Not on file   Social Determinants of Health   Financial Resource Strain: Not on file  Food Insecurity: Not on file  Transportation Needs: No Transportation Needs (01/11/2023)   PRAPARE - Transportation    Lack of Transportation (Medical): No    Lack of Transportation (Non-Medical): No  Physical Activity: Not on file  Stress: Not on file  Social Connections: Not on file    Allergies: Allergies  Allergen Reactions   Adhesive [Tape] Rash    Band-Aid   Codeine Hives, Itching and Rash    Other Reaction(s): Not available   Other Rash    Outpatient Meds: Current Outpatient Medications  Medication Sig Dispense Refill   albuterol (VENTOLIN HFA) 108 (90 Base) MCG/ACT inhaler TAKE 2 PUFFS BY MOUTH EVERY 6 HOURS AS NEEDED FOR WHEEZE OR SHORTNESS OF BREATH 8.5 each prn   allopurinol (ZYLOPRIM) 100 MG tablet TAKE 1 TABLET BY MOUTH EVERY DAY 90 tablet 1   amLODipine  (NORVASC) 10 MG tablet TAKE 1/2 TABLET BY MOUTH DAILY 90 tablet 2   Blood Glucose Monitoring Suppl (ACCU-CHEK GUIDE ME) w/Device KIT USE AS DIRECTED 1 kit prn   cetirizine (ZYRTEC) 10 MG tablet Take 10 mg by mouth as needed.     Cholecalciferol (VITAMIN D3) 50 MCG (2000 UT) TABS Take 1 tablet by mouth daily.     cloNIDine (CATAPRES) 0.1 MG tablet TAKE 1 TABLET BY MOUTH TWICE A DAY 180 tablet 3   cyclobenzaprine (FLEXERIL) 10 MG tablet Take 1 tablet (10 mg total) by mouth at bedtime. (Patient taking differently: Take 10 mg by mouth as needed for muscle spasms.) 30 tablet 2   esomeprazole (NEXIUM) 40 MG capsule  TAKE 1 CAPSULE BY MOUTH EVERY DAY 90 capsule 1   fish oil-omega-3 fatty acids 1000 MG capsule Take 2 g by mouth at bedtime. Reported on 06/18/2016 Taking 1 tablet     folic acid (FOLVITE) A999333 MCG tablet Take 400 mcg by mouth at bedtime.      glipiZIDE (GLUCOTROL XL) 10 MG 24 hr tablet TAKE 1 TABLET (10 MG TOTAL) BY MOUTH DAILY WITH BREAKFAST. 90 tablet 3   JANUMET XR [phone] MG TB24 TAKE 1 TABLET BY MOUTH EVERY DAY WITH BREAKFAST 90 tablet 3   olmesartan-hydrochlorothiazide (BENICAR HCT) 40-25 MG tablet TAKE 1 TABLET BY MOUTH EVERY DAY 90 tablet 1   simvastatin (ZOCOR) 40 MG tablet TAKE 1 TABLET BY MOUTH EVERYDAY AT BEDTIME 90 tablet 3   No current facility-administered medications for this visit.      ___________________________________________________________________ Objective   Exam:  BP (!) 110/58 (BP Location: Left Arm, Patient Position: Sitting, Cuff Size: Normal)   Pulse 76 Comment: irregular  Ht 5' 2.5" (1.588 m)   Wt 182 lb 8 oz (82.8 kg)   BMI 32.85 kg/m  Wt Readings from Last 3 Encounters:  02/01/23 182 lb 8 oz (82.8 kg)  12/25/22 183 lb 12.8 oz (83.4 kg)  12/11/22 182 lb (82.6 kg)    General: well appearing  Eyes: sclera anicteric, no redness CV: RRR, no JVD, no peripheral edema Resp: clear to auscultation bilaterally, normal RR and effort noted GI: soft, no  tenderness, with active bowel sounds. No guarding or palpable organomegaly noted.   Labs:   Radiologic Studies:  CLINICAL DATA:  Follow-up hepatic and pancreatic lesions.   EXAM: MRI ABDOMEN WITHOUT AND WITH CONTRAST   TECHNIQUE: Multiplanar multisequence MR imaging of the abdomen was performed both before and after the administration of intravenous contrast.   CONTRAST:  5m GADAVIST GADOBUTROL 1 MMOL/ML IV SOLN   COMPARISON:  Multiple priors including most recent MRI January 13, 2021   FINDINGS: Lower chest: No acute abnormality.   Hepatobiliary: Unchanged size of the 8 mm focus of delayed enhancement in the posterior dome of the liver segment VII image 40/904 this focus is isointense to background liver on T1 and T2.   Gallbladder surgically absent.  No biliary ductal dilation.   Pancreas: Cystic lesion in the pancreatic body/tail measures 5 mm on image 18/4 previously 6 mm, no suspicious postcontrast enhancement identified within the cyst. No pancreatic ductal dilation or evidence of acute inflammation.   Spleen:  Within normal limits in size and appearance.   Adrenals/Urinary Tract: Normal adrenal glands. Fluid signal 16 mm left interpolar renal lesion is compatible with a cyst, considered benign and requires no independent imaging follow up. No evidence of hydronephrosis.   Stomach/Bowel: Visualized portions within the abdomen are unremarkable.   Vascular/Lymphatic: No pathologically enlarged lymph nodes identified. No abdominal aortic aneurysm demonstrated.   Other:  None.   Musculoskeletal: No suspicious bone lesions identified.   IMPRESSION: 1. Unchanged size of the 8 mm focus of delayed enhancement in the posterior dome of the liver segment VII. This focus is isointense to background liver on T1 and T2. Imaging findings which are nonspecific and remain indeterminate but interval stability is reassuring for a benign process 2. Stable subcentimeter  cystic lesion in the pancreatic body/tail, without suspicious MRI features. 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 Recommend follow up pre and post contrast MRI/MRCP in 2 years.     Electronically Signed   By: JAndree MoroD.  On: 01/19/2023 08:00  Assessment:  Pancreatic Cyst   5 years of stability on the subcentimeter pancreatic cyst suggestive of sidebranch IPMN. I reviewed the current AGA guidelines on this which suggest no further surveillance is needed.  It should be noted that those guidelines do indicate the recommendation is based on relatively low quality evidence. Radiology guidelines for such lesions recommend following them radiographically for 5 to 10 years.  After reviewing all that and her concerns over this issue and comfort level with further surveillance, I have offered to plan a repeat MRI of the pancreas in 2 years.  If it is stable at that point, no further surveillance imaging will be recommended.  Plan:  Recall for pancreas MRI in 2026  Thank you for the courtesy of this consult.  Please call me with any questions or concerns.   - Wilfrid Lund, MD    [facility]   [place] as a scribe for [place], MD.,have documented all relevant documentation on the behalf of Doran Stabler, MD,as directed by  Doran Stabler, MD while in the presence of Doran Stabler, MD.   Salina April III, MD, have reviewed all documentation for this visit. The documentation on 02/01/23 for the exam, diagnosis, procedures, and orders are all accurate and complete.    CC: Referring provider noted above

## 2023-02-09 NOTE — Progress Notes (Signed)
Carelink Summary Report / Loop Recorder 

## 2023-02-19 ENCOUNTER — Telehealth: Payer: Self-pay

## 2023-02-19 NOTE — Telephone Encounter (Signed)
2 attempts made calling patient to give message

## 2023-02-19 NOTE — Telephone Encounter (Addendum)
Patient called asking if an Rx could be sent to pharmacy for gout in her right foot and toe joints that started yesterday.  She took a Goodie power for pain but it's not working.  I informed patient I did not see Rx on her medication list and she said it was prescribed at the ED her last flare up.  I informed patient that a message would be sent and we would return her call after hearing back from Dr. Renold Genta.

## 2023-02-19 NOTE — Telephone Encounter (Signed)
Attempted to call patient unable to leave voicemail.  

## 2023-02-22 ENCOUNTER — Ambulatory Visit (INDEPENDENT_AMBULATORY_CARE_PROVIDER_SITE_OTHER): Payer: Medicare PPO

## 2023-02-22 VITALS — BP 114/68 | HR 76 | Temp 98.4°F | Ht 62.5 in | Wt 182.0 lb

## 2023-02-22 DIAGNOSIS — Z23 Encounter for immunization: Secondary | ICD-10-CM | POA: Diagnosis not present

## 2023-02-22 MED ORDER — HEPATITIS A VACCINE 1440 EL U/ML IM SUSP
1.0000 mL | Freq: Once | INTRAMUSCULAR | Status: AC
  Administered 2023-02-22: 1440 [IU] via INTRAMUSCULAR

## 2023-02-22 NOTE — Telephone Encounter (Signed)
Patient was seen in office today for a nurse visit and was asked how she was feeling and patient stated she feels better and didn't want to schedule an office visit

## 2023-02-22 NOTE — Progress Notes (Signed)
Patient seen in office for nurse visit second dose Hepatitis A, Adult was given in left deltoid,  patient tolerated well.  Vital signs are within normal limits.

## 2023-02-27 ENCOUNTER — Other Ambulatory Visit: Payer: Self-pay | Admitting: Physician Assistant

## 2023-03-08 ENCOUNTER — Ambulatory Visit (INDEPENDENT_AMBULATORY_CARE_PROVIDER_SITE_OTHER): Payer: Medicare PPO

## 2023-03-08 DIAGNOSIS — R55 Syncope and collapse: Secondary | ICD-10-CM

## 2023-03-08 LAB — CUP PACEART REMOTE DEVICE CHECK
Date Time Interrogation Session: 20240405231023
Implantable Pulse Generator Implant Date: 20230919

## 2023-03-09 NOTE — Progress Notes (Signed)
Carelink Summary Report / Loop Recorder 

## 2023-04-08 LAB — CUP PACEART REMOTE DEVICE CHECK
Date Time Interrogation Session: 20240508231335
Implantable Pulse Generator Implant Date: 20230919

## 2023-04-09 ENCOUNTER — Other Ambulatory Visit: Payer: Self-pay | Admitting: Internal Medicine

## 2023-04-12 ENCOUNTER — Ambulatory Visit (INDEPENDENT_AMBULATORY_CARE_PROVIDER_SITE_OTHER): Payer: Medicare PPO

## 2023-04-12 DIAGNOSIS — R55 Syncope and collapse: Secondary | ICD-10-CM

## 2023-04-12 NOTE — Progress Notes (Signed)
Carelink Summary Report / Loop Recorder 

## 2023-04-15 ENCOUNTER — Other Ambulatory Visit: Payer: Self-pay | Admitting: Internal Medicine

## 2023-04-22 ENCOUNTER — Telehealth: Payer: Self-pay | Admitting: Internal Medicine

## 2023-04-22 ENCOUNTER — Ambulatory Visit: Payer: Medicare PPO | Admitting: Internal Medicine

## 2023-04-22 ENCOUNTER — Encounter: Payer: Self-pay | Admitting: Internal Medicine

## 2023-04-22 VITALS — BP 120/66 | HR 81 | Temp 98.9°F | Ht 62.5 in | Wt 182.0 lb

## 2023-04-22 DIAGNOSIS — H6502 Acute serous otitis media, left ear: Secondary | ICD-10-CM | POA: Diagnosis not present

## 2023-04-22 DIAGNOSIS — R0981 Nasal congestion: Secondary | ICD-10-CM

## 2023-04-22 DIAGNOSIS — J069 Acute upper respiratory infection, unspecified: Secondary | ICD-10-CM

## 2023-04-22 LAB — POC COVID19 BINAXNOW: SARS Coronavirus 2 Ag: NEGATIVE

## 2023-04-22 MED ORDER — METHYLPREDNISOLONE ACETATE 80 MG/ML IJ SUSP
80.0000 mg | Freq: Once | INTRAMUSCULAR | Status: AC
  Administered 2023-04-22: 80 mg via INTRAMUSCULAR

## 2023-04-22 MED ORDER — DOXYCYCLINE HYCLATE 100 MG PO TABS: 100.0000 mg | ORAL_TABLET | Freq: Two times a day (BID) | ORAL | 0 refills | Status: DC

## 2023-04-22 MED ORDER — HYDROCODONE BIT-HOMATROP MBR 5-1.5 MG/5ML PO SOLN: 5.0000 mL | Freq: Three times a day (TID) | ORAL | 0 refills | Status: DC | PRN

## 2023-04-22 NOTE — Progress Notes (Signed)
Patient Care Team: Margaree Mackintosh, MD as PCP - General (Internal Medicine) Christell Constant, MD as PCP - Cardiology (Cardiology)  Visit Date: 04/22/23  Subjective:    Patient ID: Nancy Wagner , Female   DOB: [birth year], 72 y.o.    MRN: [id number]   72 y.o. Female presents today for cough with clear/yellow sputum, post nasal drip, hoarse voice. [place] Denies sick contact. Denies fever, chills, shortness of breath. Had sore throat for one day that has resolved.  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   GERD (gastroesophageal reflux disease)    diet control   Gout    12-08-2019  per pt last episode left foot 11/ 2020   Hirsutism    History of obstructive sleep apnea    s/p  UPPP in 1985   Hypertension    followed by pcp  (12-08-2019  per pt had stress test many yrs ago, was told ok)   Lichen simplex chronicus    OA (osteoarthritis)    RA (rheumatoid arthritis) ([facility])    12-08-2019  per pt was dx years ago, treated with high level prednisone for few weeks and some other treatment with her eyes and has been in remission since   Type 2 diabetes mellitus ([facility])    followed by pcp ---  (12-08-2019 checks cbg's daily in am,  fasting-- 110 to 126)   Uterine fibroid    Wears glasses      Family History  Problem Relation Age of Onset   Heart disease Sister     Social History   Social History Narrative   Not on file     Review of Systems  Constitutional:  Negative for fever and malaise/fatigue.  HENT:  Negative for congestion and sore throat.        (+) Post nasal drip  Eyes:  Negative for blurred vision.  Respiratory:  Positive for cough and sputum production (Clear/yellow). Negative for shortness of breath.   Cardiovascular:  Negative for chest pain, palpitations and leg  swelling.  Gastrointestinal:  Negative for vomiting.  Musculoskeletal:  Negative for back pain.  Skin:  Negative for rash.  Neurological:  Negative for loss of consciousness and headaches.       Objective:   Vitals: BP 120/66   Pulse 81   Temp 98.9 F (37.2 C) (Tympanic)   Ht 5' 2.5" (1.588 m)   Wt 182 lb (82.6 kg)   SpO2 99%   BMI 32.76 kg/m    Physical Exam Vitals and nursing note reviewed.  Constitutional:      General: She is not in acute distress.    Appearance: Normal appearance. She is not toxic-appearing.  HENT:     Head: Normocephalic and atraumatic.     Right Ear: Hearing, tympanic membrane, ear canal and external ear normal. There is impacted cerumen.     Left Ear: Hearing, ear canal and external ear normal.     Ears:     Comments: Left TM full, erythematous with splayed light reflex. Right TM partially obscured by cerumen.    Mouth/Throat:     Comments: Pharynx injected without exudate. Pulmonary:     Effort: Pulmonary effort is normal. No respiratory distress.     Breath sounds: Normal breath sounds.  No wheezing or rales.  Skin:    General: Skin is warm and dry.  Neurological:     Mental Status: She is alert and oriented to person, place, and time. Mental status is at baseline.  Psychiatric:        Mood and Affect: Mood normal.        Behavior: Behavior normal.        Thought Content: Thought content normal.        Judgment: Judgment normal.       Results:   Studies obtained and personally reviewed by me:   Labs:       Component Value Date/Time   NA 142 10/15/2022 1101   K 4.4 10/15/2022 1101   CL 102 10/15/2022 1101   CO2 31 10/15/2022 1101   GLUCOSE 117 10/15/2022 1101   BUN 18 10/15/2022 1101   CREATININE 1.32 (H) 01/10/2023 1551   CREATININE 1.35 (H) 10/15/2022 1101   CALCIUM 10.3 10/15/2022 1101   PROT 8.2 (H) 12/21/2022 0915   ALBUMIN 3.8 03/31/2018 1405   AST 21 12/21/2022 0915   AST 20 03/31/2018 1405   ALT 22 12/21/2022  0915   ALT 18 03/31/2018 1405   ALKPHOS 43 03/31/2018 1405   BILITOT 0.4 12/21/2022 0915   BILITOT 0.4 03/31/2018 1405   GFRNONAA 43 (L) 01/10/2023 1551   GFRNONAA 50 (L) 05/29/2021 0918   GFRAA 58 (L) 05/29/2021 0918     Lab Results  Component Value Date   WBC 7.2 10/15/2022   HGB 11.1 (L) 10/15/2022   HCT 34.3 (L) 10/15/2022   MCV 93.2 10/15/2022   PLT 232 10/15/2022    Lab Results  Component Value Date   CHOL 143 12/21/2022   HDL 62 12/21/2022   LDLCALC 63 12/21/2022   TRIG 96 12/21/2022   CHOLHDL 2.3 12/21/2022    Lab Results  Component Value Date   HGBA1C 7.3 (H) 12/21/2022     Lab Results  Component Value Date   TSH 1.56 10/15/2022      Assessment & Plan:   Acute upper respiratory infection, left serous otitis media: prescribed Hycodan 1 tsp every 8 hours as needed, doxycycline 100 mg twice daily. Administered Depo 80 mg IM. Contact us if symptoms do not improve.    I,Alexander Ruley,acting as a Neurosurgeon for Margaree Mackintosh, MD.,have documented all relevant documentation on the behalf of Margaree Mackintosh, MD,as directed by  Margaree Mackintosh, MD while in the presence of Margaree Mackintosh, MD.   I, Margaree Mackintosh, MD, have reviewed all documentation for this visit. The documentation on 04/30/23 for the exam, diagnosis, procedures, and orders are all accurate and complete.

## 2023-04-22 NOTE — Telephone Encounter (Signed)
Nancy Wagner [phone]  Juliette Alcide called to say she is hoarse, has drainage when she lays down along with coughing, clear runny nose since Monday. Scheduled to come in at 12:30

## 2023-04-30 NOTE — Patient Instructions (Signed)
We are sorry you are not feeling well today.  You have been diagnosed with an acute left serous otitis media and an acute upper respiratory infection.  Please take doxycycline 100 mg twice daily for 10 days.  Depo-Medrol 80 mg given IM for congestion.  Please take Hycodan 1 teaspoon every 8 hours as needed for cough.  Rest and stay well-hydrated.

## 2023-05-06 NOTE — Progress Notes (Signed)
Carelink Summary Report / Loop Recorder 

## 2023-05-10 ENCOUNTER — Ambulatory Visit (INDEPENDENT_AMBULATORY_CARE_PROVIDER_SITE_OTHER): Payer: Medicare PPO

## 2023-05-10 DIAGNOSIS — R55 Syncope and collapse: Secondary | ICD-10-CM | POA: Diagnosis not present

## 2023-05-10 LAB — CUP PACEART REMOTE DEVICE CHECK
Date Time Interrogation Session: 20240609231251
Implantable Pulse Generator Implant Date: 20230919

## 2023-05-25 DIAGNOSIS — H5203 Hypermetropia, bilateral: Secondary | ICD-10-CM | POA: Diagnosis not present

## 2023-05-25 DIAGNOSIS — H25013 Cortical age-related cataract, bilateral: Secondary | ICD-10-CM | POA: Diagnosis not present

## 2023-05-25 DIAGNOSIS — H52203 Unspecified astigmatism, bilateral: Secondary | ICD-10-CM | POA: Diagnosis not present

## 2023-05-25 DIAGNOSIS — H2513 Age-related nuclear cataract, bilateral: Secondary | ICD-10-CM | POA: Diagnosis not present

## 2023-05-25 DIAGNOSIS — H524 Presbyopia: Secondary | ICD-10-CM | POA: Diagnosis not present

## 2023-05-25 DIAGNOSIS — E119 Type 2 diabetes mellitus without complications: Secondary | ICD-10-CM | POA: Diagnosis not present

## 2023-05-25 LAB — HM DIABETES EYE EXAM

## 2023-05-28 ENCOUNTER — Encounter: Payer: Self-pay | Admitting: Internal Medicine

## 2023-05-31 NOTE — Progress Notes (Signed)
Carelink Summary Report / Loop Recorder 

## 2023-06-11 ENCOUNTER — Other Ambulatory Visit: Payer: Medicare PPO

## 2023-06-11 ENCOUNTER — Ambulatory Visit: Payer: Medicare PPO

## 2023-06-11 DIAGNOSIS — Z1329 Encounter for screening for other suspected endocrine disorder: Secondary | ICD-10-CM | POA: Diagnosis not present

## 2023-06-11 DIAGNOSIS — I10 Essential (primary) hypertension: Secondary | ICD-10-CM

## 2023-06-11 DIAGNOSIS — I441 Atrioventricular block, second degree: Secondary | ICD-10-CM | POA: Diagnosis not present

## 2023-06-11 DIAGNOSIS — E119 Type 2 diabetes mellitus without complications: Secondary | ICD-10-CM | POA: Diagnosis not present

## 2023-06-11 DIAGNOSIS — E785 Hyperlipidemia, unspecified: Secondary | ICD-10-CM | POA: Diagnosis not present

## 2023-06-11 DIAGNOSIS — E1169 Type 2 diabetes mellitus with other specified complication: Secondary | ICD-10-CM | POA: Diagnosis not present

## 2023-06-11 LAB — CBC WITH DIFFERENTIAL/PLATELET
Absolute Monocytes: 503 cells/uL (ref 200–950)
Basophils Absolute: 52 cells/uL (ref 0–200)
Eosinophils Absolute: 192 cells/uL (ref 15–500)
Eosinophils Relative: 2.6 %
Lymphs Abs: 2908 cells/uL (ref 850–3900)
MCH: 30 pg (ref 27.0–33.0)
MCHC: 32.4 g/dL (ref 32.0–36.0)
Neutro Abs: 3744 cells/uL (ref 1500–7800)
Platelets: 246 10*3/uL (ref 140–400)
RBC: 3.77 10*6/uL — ABNORMAL LOW (ref 3.80–5.10)
RDW: 14 % (ref 11.0–15.0)
Total Lymphocyte: 39.3 %

## 2023-06-12 LAB — COMPLETE METABOLIC PANEL WITH GFR
AG Ratio: 1.1 (calc) (ref 1.0–2.5)
ALT: 21 U/L (ref 6–29)
AST: 21 U/L (ref 10–35)
Albumin: 4.2 g/dL (ref 3.6–5.1)
Alkaline phosphatase (APISO): 57 U/L (ref 37–153)
BUN/Creatinine Ratio: 18 (calc) (ref 6–22)
BUN: 22 mg/dL (ref 7–25)
CO2: 29 mmol/L (ref 20–32)
Calcium: 10.1 mg/dL (ref 8.6–10.4)
Chloride: 102 mmol/L (ref 98–110)
Creat: 1.25 mg/dL — ABNORMAL HIGH (ref 0.60–1.00)
Globulin: 4 g/dL (calc) — ABNORMAL HIGH (ref 1.9–3.7)
Glucose, Bld: 115 mg/dL — ABNORMAL HIGH (ref 65–99)
Potassium: 4.1 mmol/L (ref 3.5–5.3)
Sodium: 142 mmol/L (ref 135–146)
Total Bilirubin: 0.4 mg/dL (ref 0.2–1.2)
Total Protein: 8.2 g/dL — ABNORMAL HIGH (ref 6.1–8.1)
eGFR: 46 mL/min/{1.73_m2} — ABNORMAL LOW (ref >=60)

## 2023-06-12 LAB — CBC WITH DIFFERENTIAL/PLATELET
Basophils Relative: 0.7 %
HCT: 34.9 % — ABNORMAL LOW (ref 35.0–45.0)
Hemoglobin: 11.3 g/dL — ABNORMAL LOW (ref 11.7–15.5)
MCV: 92.6 fL (ref 80.0–100.0)
MPV: 11.4 fL (ref 7.5–12.5)
Monocytes Relative: 6.8 %
Neutrophils Relative %: 50.6 %
WBC: 7.4 10*3/uL (ref 3.8–10.8)

## 2023-06-12 LAB — LIPID PANEL
Cholesterol: 171 mg/dL (ref <200)
HDL: 72 mg/dL (ref >=50)
LDL Cholesterol (Calc): 82 mg/dL (calc)
Non-HDL Cholesterol (Calc): 99 mg/dL (calc) (ref <130)
Total CHOL/HDL Ratio: 2.4 (calc) (ref <5.0)
Triglycerides: 87 mg/dL (ref <150)

## 2023-06-12 LAB — HEMOGLOBIN A1C
Hgb A1c MFr Bld: 6.9 % of total Hgb — ABNORMAL HIGH (ref <5.7)
Mean Plasma Glucose: 151 mg/dL
eAG (mmol/L): 8.4 mmol/L

## 2023-06-12 LAB — TSH: TSH: 1.58 mIU/L (ref 0.40–4.50)

## 2023-06-14 LAB — CUP PACEART REMOTE DEVICE CHECK
Date Time Interrogation Session: 20240712230607
Implantable Pulse Generator Implant Date: 20230919

## 2023-06-14 NOTE — Progress Notes (Signed)
Annual Wellness Visit    Patient Care Team: Erico Stan, Luanna Cole, MD as PCP - General (Internal Medicine) Christell Constant, MD as PCP - Cardiology (Cardiology) Janet Berlin, MD as Consulting Physician (Ophthalmology)  Visit Date: 06/14/23     Subjective:   Patient: Nancy Wagner, Female    DOB: 07/23/1951, 71 y.o.   MRN: [id number]  Nancy Wagner is a 72 y.o. Female who presents today for her Annual Wellness Visit.  History of GERD treated with esomeprazole 40 mg daily.  History of hypertension treated with olmesartan-hydrochlorothiazide 40-25 mg daily.  History of hyperlipidemia treated with simvastatin 40 mg at bedtime.   History of obstructive sleep apnea status post UPPP 1985.  History of Type 2 diabetes mellitus treated with glipizide 10 mg daily with breakfast, Janumet [phone] mg daily with breakfast.  History of rheumatoid arthritis, osteoarthritis   History of gout treated with allopurinol 100 mg daily.  History of muscle spasms treated with cyclobenzaprine 10 mg as needed.  Had eye exam May 21, 2021 by Dr. Burgess Estelle.   Reviewed Covid vaccines. Tdap due next year.  Dr. Estanislado Pandy is GYN physician.  Had robotic assisted BSO in 2021.  This was for hyperandrogenism.  No tumor was found  History of hypertension and obesity.  Intolerant of codeine as it causes rash and  Social history: She is married.  Retired Engineer, production at [employer].  She was referred by Aliene Altes.  Has been married for nearly 50 years and is planning a celebration later this year.  2 granddaughters.  1 granddaughter attends [employer].  Husband is a Comptroller and has been teaching at [employer].  2 adult children, a son and a daughter.  She does not smoke or consume alcohol.  [place] of [place].  Attended [employer].  Family history: Parents in their late 75s.  4 brothers.  1 brother died of complications of cancer.  4 sisters, 3  sisters are in good health but 1 sister with history of MI no other health issues.  Mammogram last completed 10/07/22. No mammographic evidence of malignancy. Recommended repeat in 2024.  She had colonoscopy in 2019 with 10-year follow-up recommended.    Vaccine counseling: UTD on tetanus vaccine.  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   GERD (gastroesophageal reflux disease)    diet control   Gout    12-08-2019  per pt last episode left foot 11/ 2020   Hirsutism    History of obstructive sleep apnea    s/p  UPPP in 1985   Hypertension    followed by pcp  (12-08-2019  per pt had stress test many yrs ago, was told ok)   Lichen simplex chronicus    OA (osteoarthritis)    RA (rheumatoid arthritis) (HCC)    12-08-2019  per pt was dx years ago, treated with high level prednisone for few weeks and some other treatment with her eyes and has been in remission since   Type 2 diabetes mellitus (HCC)    followed by pcp ---  (12-08-2019 checks cbg's daily in am,  fasting-- 110 to 126)   Uterine fibroid    Wears glasses      Family History  Problem Relation Age of Onset   Heart disease Sister  Social History: Retired Runner, broadcasting/film/video. No ETOH. Does not smoke. Married has grandchildren. Husband is a retired  from [employer] where he was a Professor.Pt. does a lot of community service.    ROS no chest pain, no SOB, no palpitations   Objective:   Vitals: BP 128/88 pulse 86 RR 16 pulse ox (*5 weight 181 lbs 8 oz Ht 50ft 3 in BMI 32.15  Physical Exam skin: Warm and dry.  No cervical adenopathy, thyromegaly or carotid bruits.  Chest clear.  Cardiac exam: Regular rate and rhythm without ectopy.  Breasts are without masses, GYN exam deferred to GYN physician.  Abdomen is soft nondistended without hepatosplenomegaly masses or tenderness.  No pitting edema of the  lower extremities.  Neurological exam is intact without gross focal deficits.  Affect thought and judgment are normal.   Most recent functional status assessment:    06/18/2022   11:09 AM  In your present state of health, do you have any difficulty performing the following activities:  Hearing? 0  Vision? 0  Difficulty concentrating or making decisions? 0  Walking or climbing stairs? 0  Dressing or bathing? 0  Doing errands, shopping? 0  Preparing Food and eating ? N  Using the Toilet? N  In the past six months, have you accidently leaked urine? N  Do you have problems with loss of bowel control? N  Managing your Medications? N  Managing your Finances? N  Housekeeping or managing your Housekeeping? N   Most recent fall risk assessment:    04/22/2023   12:31 PM  Fall Risk   Falls in the past year? 0  Number falls in past yr: 0  Injury with Fall? 1  Risk for fall due to : No Fall Risks  Follow up Falls prevention discussed    Most recent depression screenings:    04/22/2023   12:31 PM 02/22/2023   10:34 AM  PHQ 2/9 Scores  PHQ - 2 Score 0 0   Most recent cognitive screening:    06/18/2022   11:10 AM  6CIT Screen  What Year? 0 points  What month? 0 points  What time? 0 points  Count back from 20 0 points  Months in reverse 0 points  Repeat phrase 0 points  Total Score 0 points     Results:   Studies obtained and personally reviewed by me:  Imaging, colonoscopy, mammogram, bone density scan, echocardiogram, heart cath, stress test, CT calcium score, etc.    Labs:       Component Value Date/Time   NA 142 06/11/2023 0942   K 4.1 06/11/2023 0942   CL 102 06/11/2023 0942   CO2 29 06/11/2023 0942   GLUCOSE 115 (H) 06/11/2023 0942   BUN 22 06/11/2023 0942   CREATININE 1.25 (H) 06/11/2023 0942   CALCIUM 10.1 06/11/2023 0942   PROT 8.2 (H) 06/11/2023 0942   ALBUMIN 3.8 03/31/2018 1405   AST 21 06/11/2023 0942   AST 20 03/31/2018 1405   ALT 21 06/11/2023  0942   ALT 18 03/31/2018 1405   ALKPHOS 43 03/31/2018 1405   BILITOT 0.4 06/11/2023 0942   BILITOT 0.4 03/31/2018 1405   GFRNONAA 43 (L) 01/10/2023 1551   GFRNONAA 50 (L) 05/29/2021 0918   GFRAA 58 (L) 05/29/2021 0918     Lab Results  Component Value Date   WBC 7.4 06/11/2023   HGB 11.3 (L) 06/11/2023   HCT 34.9 (L) 06/11/2023   MCV 92.6 06/11/2023   PLT 246  06/11/2023    Lab Results  Component Value Date   CHOL 171 06/11/2023   HDL 72 06/11/2023   LDLCALC 82 06/11/2023   TRIG 87 06/11/2023   CHOLHDL 2.4 06/11/2023    Lab Results  Component Value Date   HGBA1C 6.9 (H) 06/11/2023      Assessment & Plan:   Hyperlipidemia-stable on current regimen  Hx of syncope no recent episodes-likely had cardiac syncope  Hyperlipidemia- stable  History of genital lichen sclerosus seen by GYN  Type 2 diabetes mellitus Hgb AIC 6.9% and was 7.3% in January  History of Mobitz 1 second-degree heart block  History of nocturnal Mobitz 2 heart block  History of syncope  History of benign pancreatic cyst  History of polyclonal gammopathy  Nonseasonal allergic rhinitis  Osteoarthritis left knee  History of gout with no recent exacerbations      Annual wellness visit done today including the all of the following: Reviewed patient's Family Medical History Reviewed and updated list of patient's medical providers Assessment of cognitive impairment was done Assessed patient's functional ability Established a written schedule for health screening services Health Risk Assessent Completed and Reviewed  Discussed health benefits of physical activity, and encouraged her to engage in regular exercise appropriate for her age and condition.    Patient looks well and feels well today.  Her physical exam is within normal limits and her lab work is stable.  Continue current medications and follow-up in 6 months or as needed.  Recommend flu vaccine and COVID-vaccine booster this  coming Fall    I,Alexander Ruley,acting as a Neurosurgeon for Margaree Mackintosh, MD.,have documented all relevant documentation on the behalf of Margaree Mackintosh, MD,as directed by  Margaree Mackintosh, MD while in the presence of Margaree Mackintosh, MD.   I, Margaree Mackintosh, MD, have reviewed all documentation for this visit. The documentation on 06/21/23 for the exam, diagnosis, procedures, and orders are all accurate and complete.

## 2023-06-18 ENCOUNTER — Other Ambulatory Visit: Payer: Medicare PPO

## 2023-06-21 ENCOUNTER — Ambulatory Visit (INDEPENDENT_AMBULATORY_CARE_PROVIDER_SITE_OTHER): Payer: Medicare PPO | Admitting: Internal Medicine

## 2023-06-21 ENCOUNTER — Encounter: Payer: Self-pay | Admitting: Internal Medicine

## 2023-06-21 VITALS — BP 128/88 | HR 86 | Resp 16 | Ht 63.0 in | Wt 181.5 lb

## 2023-06-21 DIAGNOSIS — G4730 Sleep apnea, unspecified: Secondary | ICD-10-CM | POA: Diagnosis not present

## 2023-06-21 DIAGNOSIS — Z6832 Body mass index (BMI) 32.0-32.9, adult: Secondary | ICD-10-CM

## 2023-06-21 DIAGNOSIS — Z0000 Encounter for general adult medical examination without abnormal findings: Secondary | ICD-10-CM | POA: Diagnosis not present

## 2023-06-21 DIAGNOSIS — M1712 Unilateral primary osteoarthritis, left knee: Secondary | ICD-10-CM | POA: Diagnosis not present

## 2023-06-21 DIAGNOSIS — E785 Hyperlipidemia, unspecified: Secondary | ICD-10-CM

## 2023-06-21 DIAGNOSIS — Z8739 Personal history of other diseases of the musculoskeletal system and connective tissue: Secondary | ICD-10-CM

## 2023-06-21 DIAGNOSIS — G5603 Carpal tunnel syndrome, bilateral upper limbs: Secondary | ICD-10-CM | POA: Diagnosis not present

## 2023-06-21 DIAGNOSIS — I10 Essential (primary) hypertension: Secondary | ICD-10-CM | POA: Diagnosis not present

## 2023-06-21 DIAGNOSIS — D890 Polyclonal hypergammaglobulinemia: Secondary | ICD-10-CM

## 2023-06-21 DIAGNOSIS — Z87898 Personal history of other specified conditions: Secondary | ICD-10-CM

## 2023-06-21 DIAGNOSIS — K862 Cyst of pancreas: Secondary | ICD-10-CM | POA: Diagnosis not present

## 2023-06-21 DIAGNOSIS — E1169 Type 2 diabetes mellitus with other specified complication: Secondary | ICD-10-CM | POA: Diagnosis not present

## 2023-06-21 DIAGNOSIS — Z8679 Personal history of other diseases of the circulatory system: Secondary | ICD-10-CM

## 2023-06-23 ENCOUNTER — Other Ambulatory Visit: Payer: Self-pay | Admitting: Internal Medicine

## 2023-06-25 NOTE — Progress Notes (Signed)
Carelink Summary Report / Loop Recorder 

## 2023-06-30 NOTE — Patient Instructions (Addendum)
Labs are stable.  Physical exam is within normal limits.  Patient looks well and seems to feel well today.  Continue current medications and follow-up in 6 months or as needed.  Recommend flu vaccine and COVID-vaccine this coming Fall.

## 2023-07-09 ENCOUNTER — Encounter: Payer: Self-pay | Admitting: Internal Medicine

## 2023-07-09 ENCOUNTER — Telehealth (INDEPENDENT_AMBULATORY_CARE_PROVIDER_SITE_OTHER): Payer: Medicare PPO | Admitting: Internal Medicine

## 2023-07-09 ENCOUNTER — Telehealth: Payer: Self-pay | Admitting: Internal Medicine

## 2023-07-09 VITALS — BP 140/70 | HR 86 | Temp 98.1°F

## 2023-07-09 DIAGNOSIS — E1169 Type 2 diabetes mellitus with other specified complication: Secondary | ICD-10-CM

## 2023-07-09 DIAGNOSIS — I10 Essential (primary) hypertension: Secondary | ICD-10-CM

## 2023-07-09 DIAGNOSIS — G4730 Sleep apnea, unspecified: Secondary | ICD-10-CM

## 2023-07-09 DIAGNOSIS — U071 COVID-19: Secondary | ICD-10-CM | POA: Diagnosis not present

## 2023-07-09 DIAGNOSIS — D890 Polyclonal hypergammaglobulinemia: Secondary | ICD-10-CM

## 2023-07-09 DIAGNOSIS — Z87898 Personal history of other specified conditions: Secondary | ICD-10-CM

## 2023-07-09 DIAGNOSIS — Z8739 Personal history of other diseases of the musculoskeletal system and connective tissue: Secondary | ICD-10-CM

## 2023-07-09 MED ORDER — NIRMATRELVIR/RITONAVIR (PAXLOVID) TABLET (RENAL DOSING): 2.0000 | ORAL_TABLET | Freq: Two times a day (BID) | ORAL | 0 refills | Status: AC

## 2023-07-09 MED ORDER — HYDROCODONE BIT-HOMATROP MBR 5-1.5 MG/5ML PO SOLN: 5.0000 mL | Freq: Three times a day (TID) | ORAL | 0 refills | Status: DC | PRN

## 2023-07-09 NOTE — Telephone Encounter (Signed)
Patient was seen today.

## 2023-07-09 NOTE — Telephone Encounter (Signed)
Nancy Wagner [id number]  Nancy Wagner called to say she started out with sore throat yesterday and today her head and eyes hurt, she is coughing and has runny nose, she sounds very horse and feels bad. I have ask her to do home COVID test and call me back to determine what type of visit we can do.

## 2023-07-09 NOTE — Patient Instructions (Signed)
We are sorry you are not feeling well today.  We are treating you for COVID-19 with Paxlovid at a renal strength due to decreased kidney functions.  Stop simvastatin while taking Paxlovid.  Stay well-hydrated and walk some to prevent atelectasis of the lungs.  Quarantine at home for 5 days.  May take Hycodan 1 teaspoon every 8 hours as needed for cough.  Suggest obtaining a pulse oximetry to monitor  oxygen saturation levels and call if less than 92%.

## 2023-07-09 NOTE — Progress Notes (Signed)
Patient Care Team: Margaree Mackintosh, MD as PCP - General (Internal Medicine) Christell Constant, MD as PCP - Cardiology (Cardiology) Janet Berlin, MD as Consulting Physician (Ophthalmology)  I connected with Nancy Wagner on 07/09/23 at 11:13 AM by video enabled telemedicine visit and verified that I am speaking with the correct person using two identifiers.   I discussed the limitations, risks, security and privacy concerns of performing an evaluation and management service by telemedicine and the availability of in-person appointments. I also discussed with the patient that there may be a patient responsible charge related to this service. The patient expressed understanding and agreed to proceed.   Other persons participating in the visit and their role in the encounter: Medical scribe, Doylene Bode  Patient's location: Home  Provider's location: Clinic   I provided 10 minutes of non face-to-face telephone visit time during this encounter, and > 50% was spent counseling as documented under my assessment & plan. She is identified using two identifiers, Analycia Wagner, a patient of this practice. She is in her home and I am in my practice. She is agreeable to using this format today.   Chief Complaint: cough, headache   Subjective:    Patient ID: Nancy Wagner , Female    DOB: [birth year], 72 y.o.    MRN: [id number]   72 y.o. Female presents today for cough without sputum, chills, headache, eye pain that started 07/08/23. She is taking Aleve, Benadryl without relief. Denies nausea, vomiting, diarrhea. Appetite is normal. Reports positive at-home Covid-19 test this morning. They returned recently from a trip to [place].   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   GERD (gastroesophageal reflux disease)    diet control   Gout     12-08-2019  per pt last episode left foot 11/ 2020   Hirsutism    History of obstructive sleep apnea    s/p  UPPP in 1985   Hypertension    followed by pcp  (12-08-2019  per pt had stress test many yrs ago, was told ok)   Lichen simplex chronicus    OA (osteoarthritis)    RA (rheumatoid arthritis) (HCC)    12-08-2019  per pt was dx years ago, treated with high level prednisone for few weeks and some other treatment with her eyes and has been in remission since   Type 2 diabetes mellitus (HCC)    followed by pcp ---  (12-08-2019 checks cbg's daily in am,  fasting-- 110 to 126)   Uterine fibroid    Wears glasses      Family History  Problem Relation Age of Onset   Heart disease Sister     Social History   Social History Narrative   Not on file      Review of Systems  Constitutional:  Positive for chills. Negative for fever and malaise/fatigue.  HENT:  Negative for congestion.   Eyes:  Positive for pain. Negative for blurred vision.  Respiratory:  Positive for cough. Negative for sputum production and shortness of breath.   Cardiovascular:  Negative for chest pain, palpitations and leg swelling.  Gastrointestinal:  Negative for vomiting.  Musculoskeletal:  Negative for back pain.  Skin:  Negative for rash.  Neurological:  Positive for headaches. Negative for loss of consciousness.  Objective:   Vitals: BP (!) 140/70   Pulse 86   Temp 98.1 F (36.7 C)    Physical Exam Vitals and nursing note reviewed.  Constitutional:      General: She is not in acute distress.    Appearance: Normal appearance. She is not toxic-appearing.  HENT:     Head: Normocephalic and atraumatic.  Pulmonary:     Effort: Pulmonary effort is normal.  Skin:    General: Skin is warm and dry.  Neurological:     Mental Status: She is alert and oriented to person, place, and time. Mental status is at baseline.  Psychiatric:        Mood and Affect: Mood normal.        Behavior:  Behavior normal.        Thought Content: Thought content normal.        Judgment: Judgment normal.       Results:   Studies obtained and personally reviewed by me:   Labs:       Component Value Date/Time   NA 142 06/11/2023 0942   K 4.1 06/11/2023 0942   CL 102 06/11/2023 0942   CO2 29 06/11/2023 0942   GLUCOSE 115 (H) 06/11/2023 0942   BUN 22 06/11/2023 0942   CREATININE 1.25 (H) 06/11/2023 0942   CALCIUM 10.1 06/11/2023 0942   PROT 8.2 (H) 06/11/2023 0942   ALBUMIN 3.8 03/31/2018 1405   AST 21 06/11/2023 0942   AST 20 03/31/2018 1405   ALT 21 06/11/2023 0942   ALT 18 03/31/2018 1405   ALKPHOS 43 03/31/2018 1405   BILITOT 0.4 06/11/2023 0942   BILITOT 0.4 03/31/2018 1405   GFRNONAA 43 (L) 01/10/2023 1551   GFRNONAA 50 (L) 05/29/2021 0918   GFRAA 58 (L) 05/29/2021 0918     Lab Results  Component Value Date   WBC 7.4 06/11/2023   HGB 11.3 (L) 06/11/2023   HCT 34.9 (L) 06/11/2023   MCV 92.6 06/11/2023   PLT 246 06/11/2023    Lab Results  Component Value Date   CHOL 171 06/11/2023   HDL 72 06/11/2023   LDLCALC 82 06/11/2023   TRIG 87 06/11/2023   CHOLHDL 2.4 06/11/2023    Lab Results  Component Value Date   HGBA1C 6.9 (H) 06/11/2023     Lab Results  Component Value Date   TSH 1.58 06/11/2023      Assessment & Plan:   Acute Covid-19 infection: eGFR at 46 on 06/11/23. Prescribed Paxlovid renal strength take as directed. Stop simvastatin while taking Paxlovid. Eat well, hydrate well, and walk regularly. Quarantine for 5 days. Contact us if symptoms worsen or fail to improve. Hycodan one tsp every 8 hours as needed for cough.  Consider obtaining pulse oximeter to monitor oxygen concentrations.  Call if less than 92%.   I,Alexander Ruley,acting as a Neurosurgeon for Margaree Mackintosh, MD.,have documented all relevant documentation on the behalf of Margaree Mackintosh, MD,as directed by  Margaree Mackintosh, MD while in the presence of Margaree Mackintosh, MD.   I, Margaree Mackintosh,  MD, have reviewed all documentation for this visit. The documentation on 07/09/23 for the exam, diagnosis, procedures, and orders are all accurate and complete.  Time spent with this visit  including chart review, interviewing patient and medical decision making is 20 minutes

## 2023-07-14 ENCOUNTER — Ambulatory Visit (INDEPENDENT_AMBULATORY_CARE_PROVIDER_SITE_OTHER): Payer: Medicare PPO

## 2023-07-14 DIAGNOSIS — I441 Atrioventricular block, second degree: Secondary | ICD-10-CM | POA: Diagnosis not present

## 2023-07-15 LAB — CUP PACEART REMOTE DEVICE CHECK
Date Time Interrogation Session: 20240814230954
Implantable Pulse Generator Implant Date: 20230919

## 2023-07-17 ENCOUNTER — Other Ambulatory Visit: Payer: Self-pay | Admitting: Internal Medicine

## 2023-07-23 ENCOUNTER — Other Ambulatory Visit: Payer: Self-pay | Admitting: Internal Medicine

## 2023-07-28 NOTE — Progress Notes (Signed)
Carelink Summary Report / Loop Recorder 

## 2023-07-29 ENCOUNTER — Encounter: Payer: Self-pay | Admitting: Internal Medicine

## 2023-07-29 ENCOUNTER — Telehealth (INDEPENDENT_AMBULATORY_CARE_PROVIDER_SITE_OTHER): Payer: Medicare PPO | Admitting: Internal Medicine

## 2023-07-29 ENCOUNTER — Ambulatory Visit
Admission: RE | Admit: 2023-07-29 | Discharge: 2023-07-29 | Disposition: A | Discharge status: Home / Self Care | Payer: Medicare PPO | Source: Ambulatory Visit | Attending: Internal Medicine | Admitting: Internal Medicine

## 2023-07-29 ENCOUNTER — Other Ambulatory Visit: Payer: Self-pay | Admitting: Internal Medicine

## 2023-07-29 VITALS — BP 135/68 | HR 85

## 2023-07-29 DIAGNOSIS — U071 COVID-19: Secondary | ICD-10-CM

## 2023-07-29 DIAGNOSIS — R051 Acute cough: Secondary | ICD-10-CM | POA: Diagnosis not present

## 2023-07-29 DIAGNOSIS — R059 Cough, unspecified: Secondary | ICD-10-CM | POA: Diagnosis not present

## 2023-07-29 DIAGNOSIS — Z8679 Personal history of other diseases of the circulatory system: Secondary | ICD-10-CM | POA: Diagnosis not present

## 2023-07-29 DIAGNOSIS — J22 Unspecified acute lower respiratory infection: Secondary | ICD-10-CM | POA: Diagnosis not present

## 2023-07-29 DIAGNOSIS — R5383 Other fatigue: Secondary | ICD-10-CM

## 2023-07-29 MED ORDER — HYDROCODONE BIT-HOMATROP MBR 5-1.5 MG/5ML PO SOLN: 5.0000 mL | Freq: Three times a day (TID) | ORAL | 0 refills | Status: DC | PRN

## 2023-07-29 MED ORDER — METHYLPREDNISOLONE 4 MG PO TABS: 4.0000 mg | ORAL_TABLET | Freq: Every day | ORAL | 0 refills | Status: DC

## 2023-07-29 NOTE — Progress Notes (Addendum)
Patient Care Team: Margaree Mackintosh, MD as PCP - General (Internal Medicine) Christell Constant, MD as PCP - Cardiology (Cardiology) Janet Berlin, MD as Consulting Physician (Ophthalmology)  I connected with Nancy Wagner on 07/29/23 at 12:58 PM by video enabled telemedicine visit and verified that I am speaking with the correct person using two identifiers.   I discussed the limitations, risks, security and privacy concerns of performing an evaluation and management service by telemedicine and the availability of in-person appointments. I also discussed with the patient that there may be a patient responsible charge related to this service. The patient expressed understanding and agreed to proceed.   Other persons participating in the visit and their role in the encounter: Medical scribe, Doylene Bode  Patient's location: Home  Provider's location: Clinic    She is identified using two identifiers, Nancy Wagner, a patient of this practice. She is in her home and I am in my practice. She is agreeable to using this format today. Audio-video attempted but failed so we continued with audio only.   Chief Complaint: fatigue, cough   Subjective:    Patient ID: Nancy Wagner , Female    DOB: Jun 12, 1951, 72 y.o.    MRN: [id number]   72 y.o. Female presents today for malaise/fatigue, cough with clear sputum, sneezing, rhinorrhea, head congestion, ear fullness. Denies chest pain. Treated here for acute Covid-19 infection on 07/09/23 with Paxlovid.  History of mobitz 1 and 2 heart blocks and syncopal episodes. Seen by electrophysiologist, Dr. Elberta Fortis, in July 2023. She was taken off of atenolol and had not had any further syncopal episodes at that time. Medtronic Reveal LINQ implantable loop recorder for syncope was placed on 08/18/22.  Past Medical History:  Diagnosis Date   Asthma    controlled  (followed by pcp)   Cervical stenosis (uterine cervix)    Elevated testosterone level  in female    persistant   GERD (gastroesophageal reflux disease)    diet control   Gout    12-08-2019  per pt last episode left foot 11/ 2020   Hirsutism    History of obstructive sleep apnea    s/p  UPPP in 1985   Hypertension    followed by pcp  (12-08-2019  per pt had stress test many yrs ago, was told ok)   Lichen simplex chronicus    OA (osteoarthritis)    RA (rheumatoid arthritis) (HCC)    12-08-2019  per pt was dx years ago, treated with high level prednisone for few weeks and some other treatment with her eyes and has been in remission since   Type 2 diabetes mellitus (HCC)    followed by pcp ---  (12-08-2019 checks cbg's daily in am,  fasting-- 110 to 126)   Uterine fibroid    Wears glasses      Family History  Problem Relation Age of Onset   Heart disease Sister     Social History   Social History Narrative   Not on file      Review of Systems  Constitutional:  Positive for malaise/fatigue. Negative for fever.  HENT:  Positive for congestion (Head) and ear pain (Fullness).        (+) Sneezing (+) Rhinorrhea  Eyes:  Negative for blurred vision.  Respiratory:  Positive for cough and sputum production (Clear). Negative for shortness of breath.   Cardiovascular:  Negative for chest pain, palpitations and leg swelling.  Gastrointestinal:  Negative for vomiting.  Musculoskeletal:  Negative for back pain.  Skin:  Negative for rash.  Neurological:  Negative for loss of consciousness and headaches.        Objective:   Vitals: BP 135/68   Pulse 85    Physical Exam Vitals and nursing note reviewed.  Constitutional:      General: She is not in acute distress.    Appearance: Normal appearance. She is not toxic-appearing.  HENT:     Head: Normocephalic and atraumatic.  Pulmonary:     Effort: Pulmonary effort is normal.  Skin:    General: Skin is warm and dry.  Neurological:     Mental Status: She is alert and oriented to person, place, and time. Mental  status is at baseline.  Psychiatric:        Mood and Affect: Mood normal.        Behavior: Behavior normal.        Thought Content: Thought content normal.        Judgment: Judgment normal.       Results:   Studies obtained and personally reviewed by me:  Labs:       Component Value Date/Time   NA 142 06/11/2023 0942   K 4.1 06/11/2023 0942   CL 102 06/11/2023 0942   CO2 29 06/11/2023 0942   GLUCOSE 115 (H) 06/11/2023 0942   BUN 22 06/11/2023 0942   CREATININE 1.25 (H) 06/11/2023 0942   CALCIUM 10.1 06/11/2023 0942   PROT 8.2 (H) 06/11/2023 0942   ALBUMIN 3.8 03/31/2018 1405   AST 21 06/11/2023 0942   AST 20 03/31/2018 1405   ALT 21 06/11/2023 0942   ALT 18 03/31/2018 1405   ALKPHOS 43 03/31/2018 1405   BILITOT 0.4 06/11/2023 0942   BILITOT 0.4 03/31/2018 1405   GFRNONAA 43 (L) 01/10/2023 1551   GFRNONAA 50 (L) 05/29/2021 0918   GFRAA 58 (L) 05/29/2021 0918     Lab Results  Component Value Date   WBC 7.4 06/11/2023   HGB 11.3 (L) 06/11/2023   HCT 34.9 (L) 06/11/2023   MCV 92.6 06/11/2023   PLT 246 06/11/2023    Lab Results  Component Value Date   CHOL 171 06/11/2023   HDL 72 06/11/2023   LDLCALC 82 06/11/2023   TRIG 87 06/11/2023   CHOLHDL 2.4 06/11/2023    Lab Results  Component Value Date   HGBA1C 6.9 (H) 06/11/2023     Lab Results  Component Value Date   TSH 1.58 06/11/2023      Assessment & Plan:   Persistent Lower Respiratory Infection: prescribed Medrol tapering course take as directed (6-5-4-3-2-1). Refilled Hycodan syrup 1 tsp every 8 hours as needed for cough. Ordered CXR. Will contact with results.  Addendum: CXR is negative and pt was informed by phone call.  I,Alexander Ruley,acting as a Neurosurgeon for Margaree Mackintosh, MD.,have documented all relevant documentation on the behalf of Margaree Mackintosh, MD,as directed by  Margaree Mackintosh, MD while in the presence of Margaree Mackintosh, MD.   I, Margaree Mackintosh, MD, have reviewed all documentation for  this visit. The documentation on 07/29/23 for the exam, diagnosis, procedures, and orders are all accurate and complete.

## 2023-07-29 NOTE — Telephone Encounter (Signed)
Nancy Wagner called to say she is not feeling much better she is still coughing it is keeping her up at night, she keeps a slight headache, is sneezing and just does not feel good.

## 2023-07-29 NOTE — Patient Instructions (Addendum)
Patient has persistent  respiratory congestion post Covid. Will have CXR today, Prescribed Medrol dose pack and Hycodan.   CXR is negative.

## 2023-07-29 NOTE — Telephone Encounter (Signed)
scheduled

## 2023-08-16 ENCOUNTER — Ambulatory Visit (INDEPENDENT_AMBULATORY_CARE_PROVIDER_SITE_OTHER): Payer: Medicare PPO

## 2023-08-16 DIAGNOSIS — I441 Atrioventricular block, second degree: Secondary | ICD-10-CM | POA: Diagnosis not present

## 2023-08-27 ENCOUNTER — Other Ambulatory Visit: Payer: Self-pay | Admitting: Internal Medicine

## 2023-08-27 DIAGNOSIS — Z1231 Encounter for screening mammogram for malignant neoplasm of breast: Secondary | ICD-10-CM

## 2023-08-30 NOTE — Progress Notes (Signed)
Carelink Summary Report / Loop Recorder 

## 2023-09-19 LAB — CUP PACEART REMOTE DEVICE CHECK
Date Time Interrogation Session: 20241019230719
Implantable Pulse Generator Implant Date: 20230919

## 2023-09-20 ENCOUNTER — Ambulatory Visit (INDEPENDENT_AMBULATORY_CARE_PROVIDER_SITE_OTHER): Payer: Medicare PPO

## 2023-09-20 DIAGNOSIS — I441 Atrioventricular block, second degree: Secondary | ICD-10-CM

## 2023-09-23 ENCOUNTER — Telehealth: Payer: Self-pay | Admitting: Internal Medicine

## 2023-09-23 NOTE — Telephone Encounter (Signed)
Patient called and said she is having pain all over her body and wanted to schedule with Dr Lenord Fellers, I added her on for tomorrow at 3:30pm but wanted to send a message back in case you would have any follow up questions for her or if you would like her to come in sooner. Please advise

## 2023-09-24 ENCOUNTER — Encounter: Payer: Self-pay | Admitting: Internal Medicine

## 2023-09-24 ENCOUNTER — Ambulatory Visit: Payer: Medicare PPO | Admitting: Internal Medicine

## 2023-09-24 VITALS — BP 120/80 | HR 66 | Ht 63.0 in | Wt 185.0 lb

## 2023-09-24 DIAGNOSIS — Z8739 Personal history of other diseases of the musculoskeletal system and connective tissue: Secondary | ICD-10-CM | POA: Diagnosis not present

## 2023-09-24 DIAGNOSIS — E1169 Type 2 diabetes mellitus with other specified complication: Secondary | ICD-10-CM | POA: Diagnosis not present

## 2023-09-24 DIAGNOSIS — I441 Atrioventricular block, second degree: Secondary | ICD-10-CM

## 2023-09-24 DIAGNOSIS — K862 Cyst of pancreas: Secondary | ICD-10-CM | POA: Diagnosis not present

## 2023-09-24 DIAGNOSIS — G5603 Carpal tunnel syndrome, bilateral upper limbs: Secondary | ICD-10-CM

## 2023-09-24 DIAGNOSIS — E785 Hyperlipidemia, unspecified: Secondary | ICD-10-CM

## 2023-09-24 DIAGNOSIS — M2550 Pain in unspecified joint: Secondary | ICD-10-CM | POA: Diagnosis not present

## 2023-09-24 DIAGNOSIS — I10 Essential (primary) hypertension: Secondary | ICD-10-CM | POA: Diagnosis not present

## 2023-09-24 DIAGNOSIS — G4730 Sleep apnea, unspecified: Secondary | ICD-10-CM | POA: Diagnosis not present

## 2023-09-24 DIAGNOSIS — D890 Polyclonal hypergammaglobulinemia: Secondary | ICD-10-CM

## 2023-09-24 MED ORDER — METHYLPREDNISOLONE 4 MG PO TABS: ORAL_TABLET | ORAL | 0 refills | Status: DC

## 2023-09-24 MED ORDER — HYDROCODONE-ACETAMINOPHEN 5-325 MG PO TABS: 1.0000 | ORAL_TABLET | ORAL | 0 refills | Status: DC | PRN

## 2023-09-24 MED ORDER — TRIAMCINOLONE ACETONIDE 0.1 % EX CREA: 1.0000 | TOPICAL_CREAM | Freq: Three times a day (TID) | CUTANEOUS | 0 refills | Status: AC

## 2023-09-24 NOTE — Progress Notes (Unsigned)
Subjective:    Patient ID: Nancy Wagner, female    DOB: [birth year], 72 y.o.   MRN: [id number]  HPI    Review of Systems     Objective:   Physical Exam        Assessment & Plan:

## 2023-09-24 NOTE — Progress Notes (Signed)
Patient Care Team: Margaree Mackintosh, MD as PCP - General (Internal Medicine) Christell Constant, MD as PCP - Cardiology (Cardiology) Janet Berlin, MD as Consulting Physician (Ophthalmology)  Visit Date: 09/24/23  Subjective:    Patient ID: Nancy Wagner , Female   DOB: 07-08-1951, 72 y.o.    MRN: [id number]   72 y.o. Female presents today for left-sided sciatica, neck pain that radiates upward. History of gout, elevated sed rate, elevated rheumatoid factor. Seen by rheumatologist, Dr. Nickola Major at [facility]. She has been experiencing ear fullness. 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 She has tried PT in the past without relief. She attended the [facility] homecoming game recently but the pain started prior to this after lifting a table.   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   GERD (gastroesophageal reflux disease)    diet control   Gout    12-08-2019  per pt last episode left foot 11/ 2020   Hirsutism    History of obstructive sleep apnea    s/p  UPPP in 1985   Hypertension    followed by pcp  (12-08-2019  per pt had stress test many yrs ago, was told ok)   Lichen simplex chronicus    OA (osteoarthritis)    RA (rheumatoid arthritis) (HCC)    12-08-2019  per pt was dx years ago, treated with high level prednisone for few weeks and some other treatment with her eyes and has been in remission since   Type 2 diabetes mellitus (HCC)    followed by pcp ---  (12-08-2019 checks cbg's daily in am,  fasting-- 110 to 126)   Uterine fibroid    Wears glasses      Family History  Problem Relation Age of Onset   Heart disease Sister     Social History   Social History Narrative   Not on file      Review of Systems  Constitutional:  Negative for fever and malaise/fatigue.   HENT:  Positive for ear pain (Fullness). Negative for congestion.   Eyes:  Negative for blurred vision.  Respiratory:  Negative for cough and shortness of breath.   Cardiovascular:  Negative for chest pain, palpitations and leg swelling.  Gastrointestinal:  Negative for vomiting.  Musculoskeletal:  Positive for myalgias (Neck, left-lower back). Negative for back pain.  Skin:  Negative for rash.  Neurological:  Negative for loss of consciousness and headaches.        Objective:   Vitals: BP 120/80   Pulse 66   Ht 5\' 3"  (1.6 m)   Wt 185 lb (83.9 kg)   SpO2 96%   BMI 32.77 kg/m    Physical Exam Vitals and nursing note reviewed.  Constitutional:      General: She is not in acute distress.    Appearance: Normal appearance. She is not toxic-appearing.  HENT:     Head: Normocephalic and atraumatic.  Neck:     Vascular: No carotid bruit.  Cardiovascular:     Rate and Rhythm: Normal rate and regular rhythm. No extrasystoles are present.    Pulses: Normal pulses.     Heart sounds: Normal heart sounds. No murmur heard.    No friction rub. No gallop.  Pulmonary:     Effort: Pulmonary effort is normal.  No respiratory distress.     Breath sounds: Normal breath sounds. No wheezing or rales.  Musculoskeletal:     Comments: Tenderness right and left sternocleidomastoid, trapezius muscles. No edema in joints. Joints are not hot.  Skin:    General: Skin is warm and dry.     Comments: Several erythematous insect bites right anterior knee.  Neurological:     Mental Status: She is alert and oriented to person, place, and time. Mental status is at baseline.     Deep Tendon Reflexes:     Reflex Scores:      Patellar reflexes are 2+ on the right side and 2+ on the left side. Psychiatric:        Mood and Affect: Mood normal.        Behavior: Behavior normal.        Thought Content: Thought content normal.        Judgment: Judgment normal.       Results:   Studies obtained and  personally reviewed by me:   Labs:       Component Value Date/Time   NA 142 06/11/2023 0942   K 4.1 06/11/2023 0942   CL 102 06/11/2023 0942   CO2 29 06/11/2023 0942   GLUCOSE 115 (H) 06/11/2023 0942   BUN 22 06/11/2023 0942   CREATININE 1.25 (H) 06/11/2023 0942   CALCIUM 10.1 06/11/2023 0942   PROT 8.2 (H) 06/11/2023 0942   ALBUMIN 3.8 03/31/2018 1405   AST 21 06/11/2023 0942   AST 20 03/31/2018 1405   ALT 21 06/11/2023 0942   ALT 18 03/31/2018 1405   ALKPHOS 43 03/31/2018 1405   BILITOT 0.4 06/11/2023 0942   BILITOT 0.4 03/31/2018 1405   GFRNONAA 43 (L) 01/10/2023 1551   GFRNONAA 50 (L) 05/29/2021 0918   GFRAA 58 (L) 05/29/2021 0918     Lab Results  Component Value Date   WBC 7.4 06/11/2023   HGB 11.3 (L) 06/11/2023   HCT 34.9 (L) 06/11/2023   MCV 92.6 06/11/2023   PLT 246 06/11/2023    Lab Results  Component Value Date   CHOL 171 06/11/2023   HDL 72 06/11/2023   LDLCALC 82 06/11/2023   TRIG 87 06/11/2023   CHOLHDL 2.4 06/11/2023    Lab Results  Component Value Date   HGBA1C 6.9 (H) 06/11/2023     Lab Results  Component Value Date   TSH 1.58 06/11/2023      Assessment & Plan:   Multiple arthralgias with acute exacerbation: prescribed Medrol tapering course take as directed, Norco 5-325 mg every four hours as needed. Ordered CBC with Diff/Plat, cyclic citrul peptide antibody, sed rate, TSH. Will contact with results.  Multiple insect bites: prescribed triamcinolone 0.1% apply three times daily.  Return in 3 months for follow-up.    I,Alexander Ruley,acting as a Neurosurgeon for Margaree Mackintosh, MD.,have documented all relevant documentation on the behalf of Margaree Mackintosh, MD,as directed by  Margaree Mackintosh, MD while in the presence of Margaree Mackintosh, MD.   ***

## 2023-09-26 ENCOUNTER — Encounter: Payer: Self-pay | Admitting: Internal Medicine

## 2023-09-26 LAB — CBC WITH DIFFERENTIAL/PLATELET
Absolute Lymphocytes: 2878 {cells}/uL (ref 850–3900)
Absolute Monocytes: 818 {cells}/uL (ref 200–950)
Basophils Absolute: 56 {cells}/uL (ref 0–200)
Basophils Relative: 0.5 %
Eosinophils Absolute: 213 {cells}/uL (ref 15–500)
Eosinophils Relative: 1.9 %
HCT: 34.4 % — ABNORMAL LOW (ref 35.0–45.0)
Hemoglobin: 11.3 g/dL — ABNORMAL LOW (ref 11.7–15.5)
MCH: 30.2 pg (ref 27.0–33.0)
MCHC: 32.8 g/dL (ref 32.0–36.0)
MCV: 92 fL (ref 80.0–100.0)
MPV: 12 fL (ref 7.5–12.5)
Monocytes Relative: 7.3 %
Neutro Abs: 7235 {cells}/uL (ref 1500–7800)
Neutrophils Relative %: 64.6 %
Platelets: 321 10*3/uL (ref 140–400)
RBC: 3.74 10*6/uL — ABNORMAL LOW (ref 3.80–5.10)
RDW: 12.7 % (ref 11.0–15.0)
Total Lymphocyte: 25.7 %
WBC: 11.2 10*3/uL — ABNORMAL HIGH (ref 3.8–10.8)

## 2023-09-26 LAB — SEDIMENTATION RATE: Sed Rate: >130 mm/h — ABNORMAL HIGH (ref 0–30)

## 2023-09-26 LAB — TSH: TSH: 0.72 m[IU]/L (ref 0.40–4.50)

## 2023-09-26 LAB — CYCLIC CITRUL PEPTIDE ANTIBODY, IGG: Cyclic Citrullin Peptide Ab: <16 U

## 2023-09-27 ENCOUNTER — Other Ambulatory Visit: Payer: Medicare PPO

## 2023-09-27 DIAGNOSIS — M2550 Pain in unspecified joint: Secondary | ICD-10-CM | POA: Diagnosis not present

## 2023-09-27 DIAGNOSIS — Z8739 Personal history of other diseases of the musculoskeletal system and connective tissue: Secondary | ICD-10-CM | POA: Diagnosis not present

## 2023-09-27 DIAGNOSIS — D890 Polyclonal hypergammaglobulinemia: Secondary | ICD-10-CM

## 2023-09-27 DIAGNOSIS — C9000 Multiple myeloma not having achieved remission: Secondary | ICD-10-CM

## 2023-09-27 NOTE — Progress Notes (Signed)
Received referral for possible multiple myeloma work up. Per Dr Leonides Schanz we will wait on further blood work results from PCP and reevaluate. Will continue to monitor.

## 2023-09-27 NOTE — Addendum Note (Signed)
Addended by: Gregery Na on: 09/27/2023 05:12 PM   Modules accepted: Orders

## 2023-09-30 LAB — URIC ACID: Uric Acid, Serum: 6.6 mg/dL (ref 2.5–7.0)

## 2023-09-30 LAB — COMPLETE METABOLIC PANEL WITH GFR
AG Ratio: 1 (calc) (ref 1.0–2.5)
ALT: 16 U/L (ref 6–29)
AST: 20 U/L (ref 10–35)
Albumin: 4.2 g/dL (ref 3.6–5.1)
Alkaline phosphatase (APISO): 59 U/L (ref 37–153)
BUN/Creatinine Ratio: 25 (calc) — ABNORMAL HIGH (ref 6–22)
BUN: 30 mg/dL — ABNORMAL HIGH (ref 7–25)
CO2: 25 mmol/L (ref 20–32)
Calcium: 10.2 mg/dL (ref 8.6–10.4)
Chloride: 100 mmol/L (ref 98–110)
Creat: 1.21 mg/dL — ABNORMAL HIGH (ref 0.60–1.00)
Globulin: 4.4 g/dL — ABNORMAL HIGH (ref 1.9–3.7)
Glucose, Bld: 145 mg/dL — ABNORMAL HIGH (ref 65–99)
Potassium: 4.4 mmol/L (ref 3.5–5.3)
Sodium: 140 mmol/L (ref 135–146)
Total Bilirubin: 0.3 mg/dL (ref 0.2–1.2)
Total Protein: 8.6 g/dL — ABNORMAL HIGH (ref 6.1–8.1)
eGFR: 47 mL/min/{1.73_m2} — ABNORMAL LOW (ref >=60)

## 2023-09-30 LAB — PROTEIN ELECTROPHORESIS, SERUM
Albumin ELP: 4 g/dL (ref 3.8–4.8)
Alpha 1: 0.3 g/dL (ref 0.2–0.3)
Alpha 2: 1.2 g/dL — ABNORMAL HIGH (ref 0.5–0.9)
Beta 2: 1 g/dL — ABNORMAL HIGH (ref 0.2–0.5)
Beta Globulin: 0.5 g/dL (ref 0.4–0.6)
Gamma Globulin: 1.6 g/dL (ref 0.8–1.7)
Total Protein: 8.6 g/dL — ABNORMAL HIGH (ref 6.1–8.1)

## 2023-09-30 IMAGING — CT CT ABD-PELV W/ CM
2 of 5 series · 16 of 46 positions shown, 18 images · IV contrast (Omni 300)
Comparison: MRI abdomen 01/13/2021. CT abdomen and pelvis
01/19/2011.

CLINICAL DATA: Acute abdominal pain with weight loss.

EXAM:
CT ABDOMEN AND PELVIS WITH CONTRAST
TECHNIQUE: Multidetector CT imaging of the abdomen and pelvis was performed
using the standard protocol following bolus administration of
intravenous contrast.

[Series 3: a/p w/ 5mm · axial · 0.80mm/px · z∈[+817,+1207]mm · 13 of 90 slices shown, 15 images]
[im 6/90  soft-tissue]
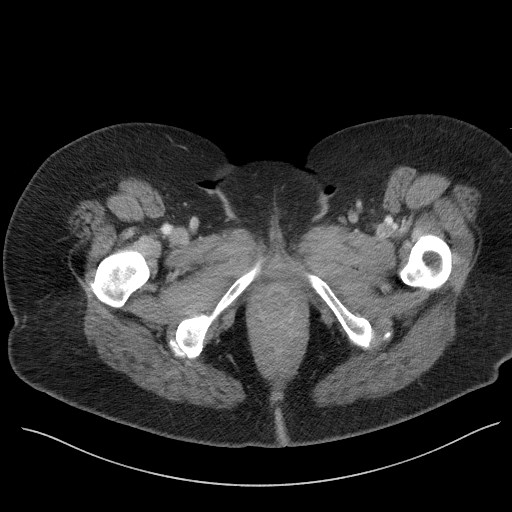
[im 6/90  bone]
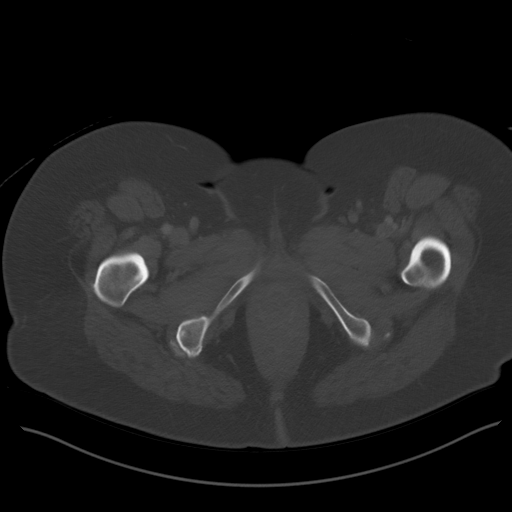
[im 11/90  soft-tissue]
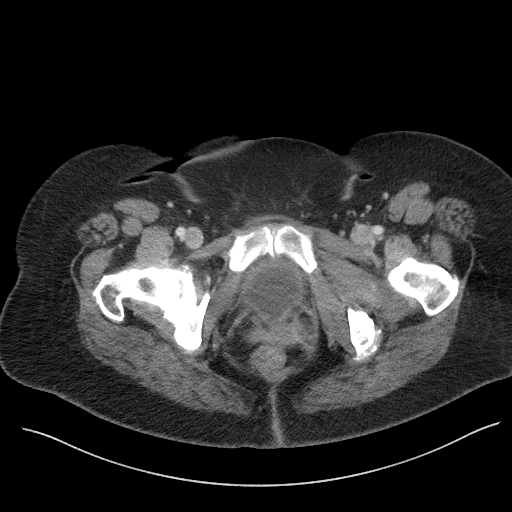
[im 21/90  soft-tissue]
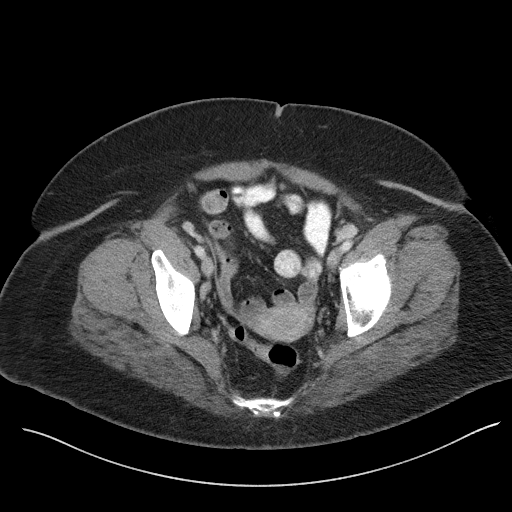
[im 27/90  soft-tissue]
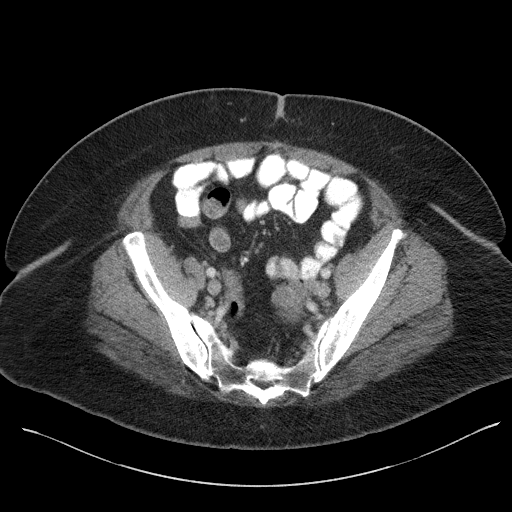
[im 32/90  soft-tissue]
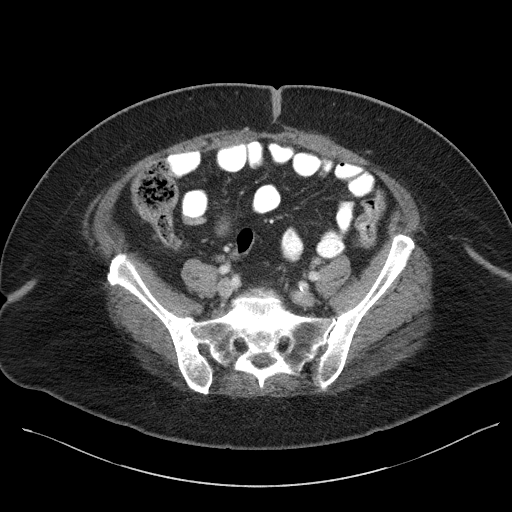
[im 37/90  soft-tissue]
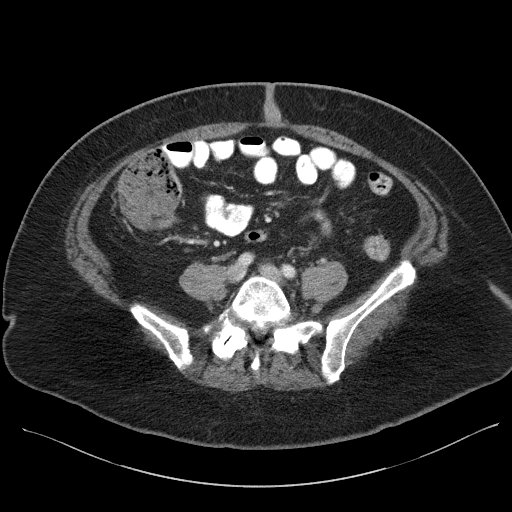
[im 48/90  soft-tissue]
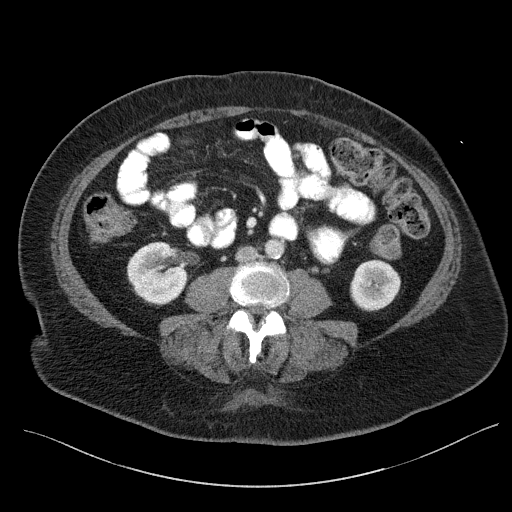
[im 53/90  soft-tissue]
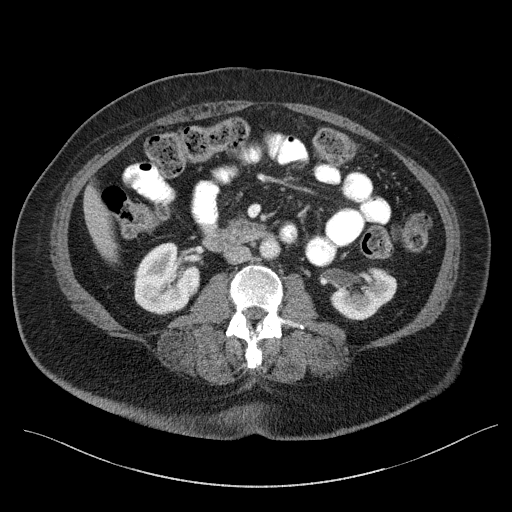
[im 58/90  soft-tissue]
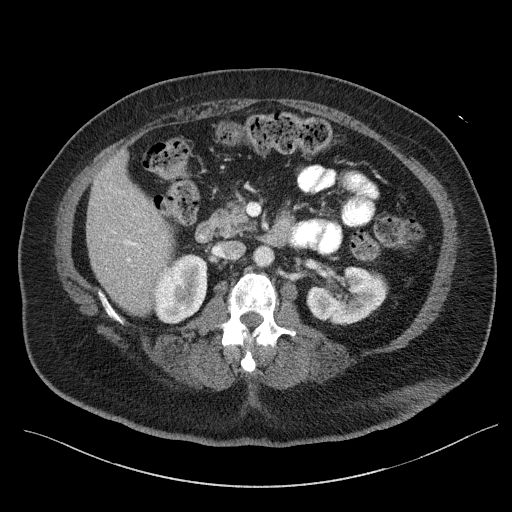
[im 58/90  bone]
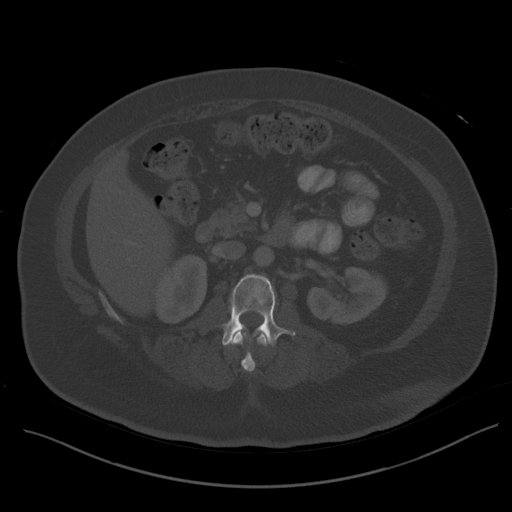
[im 63/90  soft-tissue]
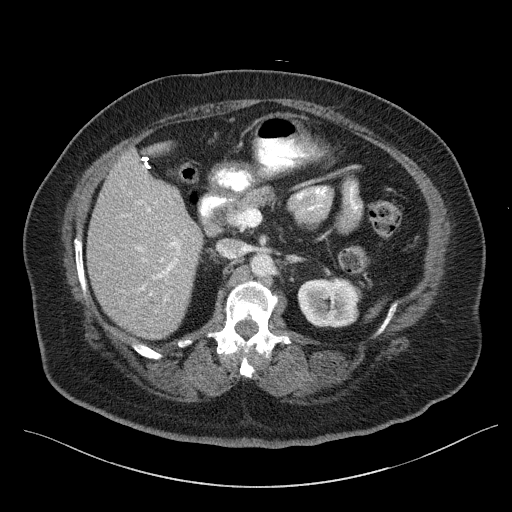
[im 69/90  soft-tissue]
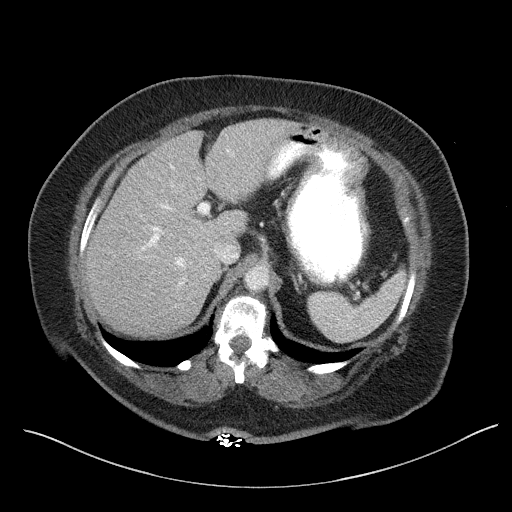
[im 79/90  soft-tissue]
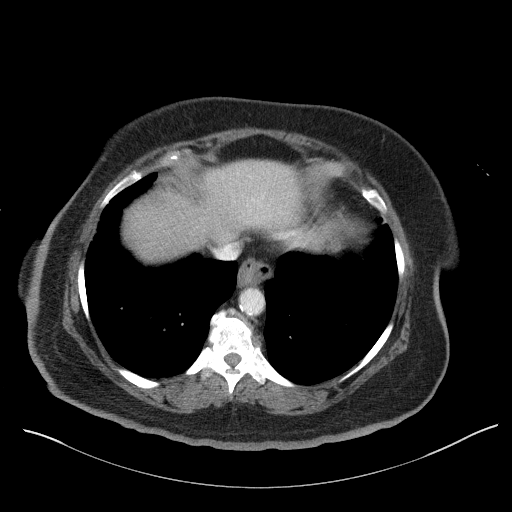
[im 84/90  soft-tissue]
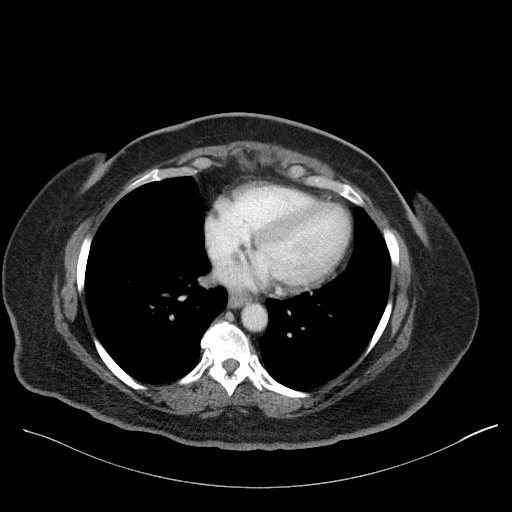

[Series 5: a/p w/ cor · coronal · 0.88mm/px · 3 of 151 slices shown]
[im 51/151  soft-tissue]
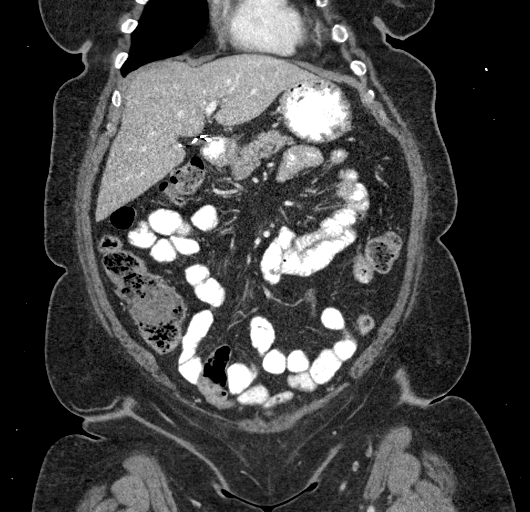
[im 67/151  soft-tissue]
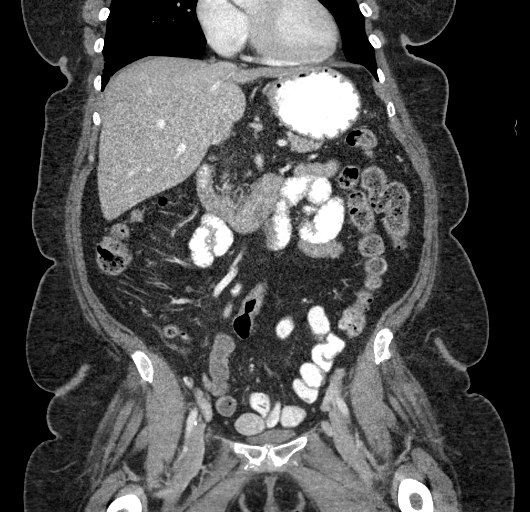
[im 84/151  soft-tissue]
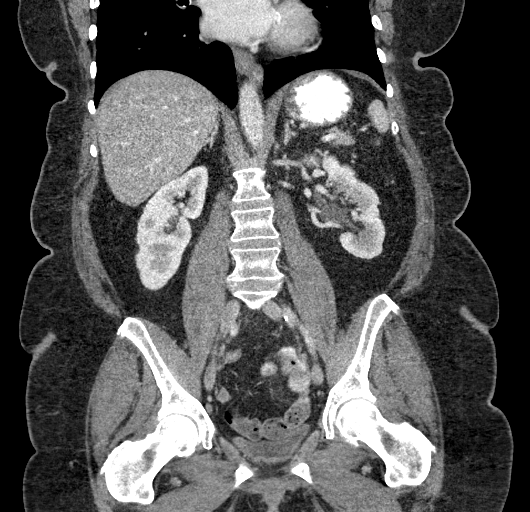

[16 of 46 positions shown; findings below may reference images not displayed]

RADIATION DOSE REDUCTION: This exam was performed according to the
departmental dose-optimization program which includes automated
exposure control, adjustment of the mA and/or kV according to
patient size and/or use of iterative reconstruction technique.

CONTRAST:  100mL OMNIPAQUE IOHEXOL 300 MG/ML  SOLN
FINDINGS: Lower chest: No acute abnormality.

Hepatobiliary: No focal liver abnormality is seen. Status post
cholecystectomy. No biliary dilatation.

Pancreas: Unremarkable. No pancreatic ductal dilatation or
surrounding inflammatory changes.

Spleen: Normal in size without focal abnormality.

Adrenals/Urinary Tract: There is a 1 cm left renal cyst. Otherwise,
the adrenal glands, kidneys and bladder are within normal limits.

Stomach/Bowel: Small hiatal hernia is present. Stomach is otherwise
within normal limits. Appendix appears normal. No evidence of bowel
wall thickening, distention, or inflammatory changes.

Vascular/Lymphatic: Aortic atherosclerosis. There is an enlarged
left external iliac chain lymph node measuring 1.3 by 2.0 by 3.8 cm
which appears mildly decreased in size compared to the prior study.
No other new enlarged lymph nodes are seen.

Reproductive: Uterus and bilateral adnexa are unremarkable.

Other: No abdominal wall hernia or abnormality. No abdominopelvic
ascites.

Musculoskeletal: No acute or significant osseous findings.
IMPRESSION: 1. No acute process in the abdomen or pelvis.
2. Enlarged left external iliac chain lymph node has slightly
decreased in size compared to 3983. Recommend clinical correlation
and follow-up.
3. Cholecystectomy.
4. Small hiatal hernia.

## 2023-10-05 NOTE — Progress Notes (Signed)
Carelink Summary Report / Loop Recorder 

## 2023-10-08 ENCOUNTER — Ambulatory Visit: Payer: Medicare PPO

## 2023-10-10 ENCOUNTER — Other Ambulatory Visit: Payer: Self-pay | Admitting: Internal Medicine

## 2023-10-12 ENCOUNTER — Inpatient Hospital Stay: Payer: Medicare PPO | Attending: Hematology and Oncology | Admitting: Hematology and Oncology

## 2023-10-12 ENCOUNTER — Inpatient Hospital Stay: Payer: Medicare PPO

## 2023-10-12 DIAGNOSIS — Z79899 Other long term (current) drug therapy: Secondary | ICD-10-CM | POA: Diagnosis not present

## 2023-10-12 DIAGNOSIS — D472 Monoclonal gammopathy: Secondary | ICD-10-CM | POA: Diagnosis not present

## 2023-10-12 DIAGNOSIS — C9000 Multiple myeloma not having achieved remission: Secondary | ICD-10-CM | POA: Insufficient documentation

## 2023-10-12 LAB — CMP (CANCER CENTER ONLY)
ALT: 20 U/L (ref 0–44)
AST: 15 U/L (ref 15–41)
Albumin: 3.9 g/dL (ref 3.5–5.0)
Alkaline Phosphatase: 46 U/L (ref 38–126)
Anion gap: 6 (ref 5–15)
BUN: 15 mg/dL (ref 8–23)
CO2: 30 mmol/L (ref 22–32)
Calcium: 9.6 mg/dL (ref 8.9–10.3)
Chloride: 104 mmol/L (ref 98–111)
Creatinine: 1.09 mg/dL — ABNORMAL HIGH (ref 0.44–1.00)
GFR, Estimated: 54 mL/min — ABNORMAL LOW (ref >60)
Glucose, Bld: 198 mg/dL — ABNORMAL HIGH (ref 70–99)
Potassium: 3.9 mmol/L (ref 3.5–5.1)
Sodium: 140 mmol/L (ref 135–145)
Total Bilirubin: 0.3 mg/dL (ref <1.2)
Total Protein: 7.6 g/dL (ref 6.5–8.1)

## 2023-10-12 LAB — CBC WITH DIFFERENTIAL (CANCER CENTER ONLY)
Abs Immature Granulocytes: 0.01 10*3/uL (ref 0.00–0.07)
Basophils Absolute: 0 10*3/uL (ref 0.0–0.1)
Basophils Relative: 1 %
Eosinophils Absolute: 0.1 10*3/uL (ref 0.0–0.5)
Eosinophils Relative: 2 %
HCT: 34.1 % — ABNORMAL LOW (ref 36.0–46.0)
Hemoglobin: 11.4 g/dL — ABNORMAL LOW (ref 12.0–15.0)
Immature Granulocytes: 0 %
Lymphocytes Relative: 33 %
Lymphs Abs: 2.2 10*3/uL (ref 0.7–4.0)
MCH: 30.6 pg (ref 26.0–34.0)
MCHC: 33.4 g/dL (ref 30.0–36.0)
MCV: 91.4 fL (ref 80.0–100.0)
Monocytes Absolute: 0.5 10*3/uL (ref 0.1–1.0)
Monocytes Relative: 8 %
Neutro Abs: 3.7 10*3/uL (ref 1.7–7.7)
Neutrophils Relative %: 56 %
Platelet Count: 182 10*3/uL (ref 150–400)
RBC: 3.73 MIL/uL — ABNORMAL LOW (ref 3.87–5.11)
RDW: 13.8 % (ref 11.5–15.5)
WBC Count: 6.6 10*3/uL (ref 4.0–10.5)
nRBC: 0 % (ref 0.0–0.2)

## 2023-10-12 LAB — C-REACTIVE PROTEIN: CRP: 0.8 mg/dL (ref <1.0)

## 2023-10-12 LAB — SEDIMENTATION RATE: Sed Rate: 76 mm/h — ABNORMAL HIGH (ref 0–22)

## 2023-10-12 LAB — LACTATE DEHYDROGENASE: LDH: 118 U/L (ref 98–192)

## 2023-10-12 NOTE — Progress Notes (Signed)
[facility] [phone]   [phone]  INITIAL CONSULT NOTE  Patient Care Team: Nancy Mackintosh, MD as PCP - General (Internal Medicine) Nancy Constant, MD as PCP - Cardiology (Cardiology) Nancy Berlin, MD as Consulting Physician (Ophthalmology)  Hematological/Oncological History # Monoclonal Gammopathy  04/11/2018: last visit with Nancy Wagner. 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 female with medical history significant for asthma, GERD, OSA, OA, rheumatoid arthritis, and type 2 diabetes who presents for evaluation of monoclonal protein.  On review of the previous records Nancy Wagner was last seen in this clinic on 04/11/2018 by Nancy Wagner.  At that time she was noted to have a polyclonal gammopathy and no further workup was recommended.  More recently on 09/27/2023 patient underwent an SPEP which showed restricted M spike band concerning for monoclonal gammopathy.  Due to concern for these findings the patient was referred to hematology for further evaluation and management.  On exam today Nancy Wagner reports that she does have very painful inflammatory episodes which can be "quite bad".  She notes that this is due to her rheumatoid arthritis and she follows with Nancy Wagner.  She notes that she has been to our clinic before to be evaluated by Nancy Wagner.  She is having joint pain but no frank bone pain or spine pain.  She has not had any recent fevers, chills, sweats, nausea, vomiting or diarrhea.  On further discussion she reports that her mother has a history of multiple myeloma treated the [facility].  Her father had prostate cancer and passed away.  Her father also had type 2 diabetes and  hypertension.  The patient has 2 healthy children.  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  Overall she is at her baseline level of health and has no questions concerns or complaints today.  A full 10 point ROS was otherwise negative.  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   GERD (gastroesophageal reflux disease)    diet control   Gout    12-08-2019  per pt last episode left foot 11/ 2020   Hirsutism    History of obstructive sleep apnea    s/p  UPPP in 1985   Hypertension    followed by pcp  (12-08-2019  per pt had stress test many yrs ago, was told ok)   Lichen simplex chronicus    OA (osteoarthritis)    RA (rheumatoid arthritis) (HCC)    12-08-2019  per pt was dx years ago, treated with high level prednisone for few weeks and some other treatment with her eyes and has been in remission since   Type 2 diabetes mellitus (HCC)    followed by pcp ---  (12-08-2019 checks cbg's daily in am,  fasting-- 110 to 126)   Uterine fibroid    Wears glasses     SURGICAL HISTORY: Past Surgical History:  Procedure Laterality Date   CARPAL TUNNEL RELEASE Right    CESAREAN SECTION  1975, 1978   CHOLECYSTECTOMY OPEN  1984  COLONOSCOPY  last one 05-09-2018   DILATATION & CURRETTAGE/HYSTEROSCOPY WITH RESECTOCOPE N/A 04/07/2017   Procedure: DILATATION & CURETTAGE/HYSTEROSCOPY WITH RESECTOCOPE;  Surgeon: Nancy Morales, MD;  Location: [facility];  Service: Gynecology;  Laterality: N/A;   ROBOTIC ASSISTED SALPINGO OOPHERECTOMY Bilateral 12/14/2019   Procedure: XI ROBOTIC ASSISTED SALPINGO OOPHORECTOMY;  Surgeon: Nancy Lay, MD;  Location: [facility];  Service: Gynecology;  Laterality: Bilateral;  Bed held, but patient is expected to go home the same day. -ap   THROAT  SURGERY  1985   minimal uvulopalatopharyngoplasty for sleep apnea   TUBAL LIGATION Bilateral 1981    SOCIAL HISTORY: Social History   Socioeconomic History   Marital status: Married    Spouse name: Not on file   Number of children: 2   Years of education: Not on file   Highest education level: Not on file  Occupational History   Not on file  Tobacco Use   Smoking status: Never   Smokeless tobacco: Never  Vaping Use   Vaping status: Never Used  Substance and Sexual Activity   Alcohol use: Yes    Comment: very rare   Drug use: Never   Sexual activity: Not on file  Other Topics Concern   Not on file  Social History Narrative   Not on file   Social Determinants of Health   Financial Resource Strain: Not on file  Food Insecurity: No Food Insecurity (10/12/2023)   Hunger Vital Sign    Worried About Running Out of Food in the Last Year: Never true    Ran Out of Food in the Last Year: Never true  Transportation Needs: No Transportation Needs (10/12/2023)   PRAPARE - Administrator, Civil Service (Medical): No    Lack of Transportation (Non-Medical): No  Physical Activity: Not on file  Stress: Not on file  Social Connections: Not on file  Intimate Partner Violence: Not At Risk (10/12/2023)   Humiliation, Afraid, Rape, and Kick questionnaire    Fear of Current or Ex-Partner: No    Emotionally Abused: No    Physically Abused: No    Sexually Abused: No    FAMILY HISTORY: Family History  Problem Relation Age of Onset   Heart disease Sister     ALLERGIES:  is allergic to adhesive [tape], codeine, and other.  MEDICATIONS:  Current Outpatient Medications  Medication Sig Dispense Refill   albuterol (VENTOLIN HFA) 108 (90 Base) MCG/ACT inhaler TAKE 2 PUFFS BY MOUTH EVERY 6 HOURS AS NEEDED FOR WHEEZE OR SHORTNESS OF BREATH 8.5 each prn   allopurinol (ZYLOPRIM) 100 MG tablet TAKE 1 TABLET BY MOUTH EVERY DAY 90 tablet 1   amLODipine (NORVASC) 10 MG tablet TAKE  1 TABLET BY MOUTH EVERY DAY 90 tablet 3   Blood Glucose Monitoring Suppl (ACCU-CHEK GUIDE ME) w/Device KIT USE AS DIRECTED 1 kit prn   cetirizine (ZYRTEC) 10 MG tablet Take 10 mg by mouth as needed.     Cholecalciferol (VITAMIN D3) 50 MCG (2000 UT) TABS Take 1 tablet by mouth daily.     cloNIDine (CATAPRES) 0.1 MG tablet TAKE 1 TABLET BY MOUTH TWICE A DAY 180 tablet 3   cyclobenzaprine (FLEXERIL) 10 MG tablet Take 1 tablet (10 mg total) by mouth at bedtime. (Patient taking differently: Take 10 mg by mouth as needed for muscle spasms.) 30 tablet 2   esomeprazole (NEXIUM) 40 MG capsule TAKE 1 CAPSULE BY MOUTH EVERY DAY 90 capsule 1   fish oil-omega-3 fatty  acids 1000 MG capsule Take 2 g by mouth at bedtime. Reported on 06/18/2016 Taking 1 tablet     folic acid (FOLVITE) 400 MCG tablet Take 400 mcg by mouth at bedtime.      glipiZIDE (GLUCOTROL XL) 10 MG 24 hr tablet TAKE 1 TABLET (10 MG TOTAL) BY MOUTH DAILY WITH BREAKFAST. 90 tablet 3   HYDROcodone-acetaminophen (NORCO) 5-325 MG tablet Take 1 tablet by mouth every 4 (four) hours as needed for severe pain (pain score 7-10). 20 tablet 0   JANUMET XR 217 550 5154 MG TB24 TAKE 1 TABLET BY MOUTH EVERY DAY WITH BREAKFAST 90 tablet 3   olmesartan-hydrochlorothiazide (BENICAR HCT) 40-25 MG tablet TAKE 1 TABLET BY MOUTH EVERY DAY 90 tablet 1   simvastatin (ZOCOR) 40 MG tablet TAKE 1 TABLET BY MOUTH EVERYDAY AT BEDTIME 90 tablet 3   triamcinolone cream (KENALOG) 0.1 % Apply 1 Application topically 3 (three) times daily. 30 g 0   methylPREDNISolone (MEDROL) 4 MG tablet Take in tapering course as directed 6-6-5-5-4-4-3-3-2-2-1-1 (Patient not taking: Reported on 10/12/2023) 42 tablet 0   No current facility-administered medications for this visit.    REVIEW OF SYSTEMS:   Constitutional: ( - ) fevers, ( - )  chills , ( - ) night sweats Eyes: ( - ) blurriness of vision, ( - ) double vision, ( - ) watery eyes Ears, nose, mouth, throat, and face: ( - ) mucositis, (  - ) sore throat Respiratory: ( - ) cough, ( - ) dyspnea, ( - ) wheezes Cardiovascular: ( - ) palpitation, ( - ) chest discomfort, ( - ) lower extremity swelling Gastrointestinal:  ( - ) nausea, ( - ) heartburn, ( - ) change in bowel habits Skin: ( - ) abnormal skin rashes Lymphatics: ( - ) new lymphadenopathy, ( - ) easy bruising Neurological: ( - ) numbness, ( - ) tingling, ( - ) new weaknesses Behavioral/Psych: ( - ) mood change, ( - ) new changes  All other systems were reviewed with the patient and are negative.  PHYSICAL EXAMINATION:  There were no vitals filed for this visit. There were no vitals filed for this visit.  GENERAL: well appearing elderly African-American female in NAD  SKIN: skin color, texture, turgor are normal, no rashes or significant lesions EYES: conjunctiva are pink and non-injected, sclera clear LUNGS: clear to auscultation and percussion with normal breathing effort HEART: regular rate & rhythm and no murmurs and no lower extremity edema Musculoskeletal: no cyanosis of digits and no clubbing  PSYCH: alert & oriented x 3, fluent speech NEURO: no focal motor/sensory deficits  LABORATORY DATA:  I have reviewed the data as listed    Latest Ref Rng & Units 10/12/2023   10:51 AM 09/24/2023    4:09 PM 06/11/2023    9:42 AM  CBC  WBC 4.0 - 10.5 K/uL 6.6  11.2  7.4   Hemoglobin 12.0 - 15.0 g/dL 16.1  09.6  04.5   Hematocrit 36.0 - 46.0 % 34.1  34.4  34.9   Platelets 150 - 400 K/uL 182  321  246        Latest Ref Rng & Units 10/12/2023   10:51 AM 09/27/2023   11:51 AM 06/11/2023    9:42 AM  CMP  Glucose 70 - 99 mg/dL 409  811  914   BUN 8 - 23 mg/dL 15  30  22    Creatinine 0.44 - 1.00 mg/dL 7.82  9.56  2.13   Sodium 135 -  145 mmol/L 140  140  142   Potassium 3.5 - 5.1 mmol/L 3.9  4.4  4.1   Chloride 98 - 111 mmol/L 104  100  102   CO2 22 - 32 mmol/L 30  25  29    Calcium 8.9 - 10.3 mg/dL 9.6  53.6  64.4   Total Protein 6.5 - 8.1 g/dL 7.6  8.6    8.6   8.2   Total Bilirubin <1.2 mg/dL 0.3  0.3  0.4   Alkaline Phos 38 - 126 U/L 46     AST 15 - 41 U/L 15  20  21    ALT 0 - 44 U/L 20  16  21       ASSESSMENT & PLAN Nancy Wagner 72 y.o. female with medical history significant for asthma, GERD, OSA, OA, rheumatoid arthritis, and type 2 diabetes who presents for evaluation of monoclonal protein.  After review of the labs, review of the records, and discussion with the patient the patients findings are most consistent with a monoclonal gammopathy (or polyclonal gammopathy in the setting of inflammation).  Monoclonal Gammopathies are a group of medical conditions defined by the presence of a monoclonal protein (an M protein) in the blood or urine. Monoclonal gammopathies include monoclonal gammopathy of unknown significance (MGUS), Monoclonal gammopathies of renal or neurological significance,  smoldering multiple myeloma (SMM), multiple myeloma (MM), AL amyloidosis, and Waldenstrom macroglobulinemia. The goal of the initial workup is to determine which monoclonal gammopathy a patient has. The workup consists of evaluating protein in the serum (with serum protein electrophoresis (SPEP) and serum free light chains) , evaluating protein in the urine (UPEP), and evaluation of the skeleton (DG Bone Met Survey) to assure no lytic lesions. Baseline bloodwork includes CMP and CBC. If no CRAB criteria or high risk criteria are noted then the diagnosis is MGUS. MGUS must be followed with bloodwork periodically to assure it does not convert to multiple myeloma (occurs to approximately 1% of patients per year). If there are CRAB criteria or high risk features (such as elevated serum free light chain ratio (taking into account renal function), a non IgG M protein, or M protein >1.5) then a bone marrow biopsy must be pursued.    #Monoclonal Gammopathy of Undetermined Significance --today will order an SPEP, UPEP, SFLC and beta 2 microglobulin --additionally will  collect new baseline CBC, CMP, and LDH --recommend a metastatic bone survey to assess for lytic lesions --will consider the need for a bone marrow biopsy pending the above results --RTC placeholder visit in 6 months. Can see sooner if abnormalities noted in above bloodwork.    Orders Placed This Encounter  Procedures   DG Bone Survey Met    Standing Status:   Future    Standing Expiration Date:   10/11/2024    Order Specific Question:   Reason for Exam (SYMPTOM  OR DIAGNOSIS REQUIRED)    Answer:   MGUS, assess for lytic lesions    Order Specific Question:   Preferred imaging location?    Answer:   [facility]   CBC with Differential (Cancer Center Only)    Standing Status:   Future    Number of Occurrences:   1    Standing Expiration Date:   10/11/2024   CMP (Cancer Center only)    Standing Status:   Future    Number of Occurrences:   1    Standing Expiration Date:   10/11/2024   Lactate dehydrogenase (LDH)  Standing Status:   Future    Number of Occurrences:   1    Standing Expiration Date:   10/11/2024   Multiple Myeloma Panel (SPEP&IFE w/QIG)    Standing Status:   Future    Number of Occurrences:   1    Standing Expiration Date:   10/11/2024   Kappa/lambda light chains    Standing Status:   Future    Number of Occurrences:   1    Standing Expiration Date:   10/11/2024   Beta 2 microglobulin    Standing Status:   Future    Number of Occurrences:   1    Standing Expiration Date:   10/11/2024   24-Hr Ur UPEP/UIFE/Light Chains/TP    Standing Status:   Future    Standing Expiration Date:   10/11/2024   Sedimentation rate    Standing Status:   Future    Number of Occurrences:   1    Standing Expiration Date:   10/11/2024   C-reactive protein    Standing Status:   Future    Number of Occurrences:   1    Standing Expiration Date:   10/11/2024    All questions were answered. The patient knows to call the clinic with any problems, questions or concerns.  A  total of more than 60 minutes were spent on this encounter with face-to-face time and non-face-to-face time, including preparing to see the patient, ordering tests and/or medications, counseling the patient and coordination of care as outlined above.   Ulysees Barns, MD Department of Hematology/Oncology [facility] Phone: [phone] Pager: [phone] Email: [email or web address]  10/12/2023 4:06 PM

## 2023-10-12 NOTE — Patient Instructions (Signed)
Thank you for choosing [facility] to provide your care. If you have questions after your visit to [facility]), please contact this office at [phone] between 8:00 am and 4 pm and select the option for your provider. Voicemails left after 4:30 pm are not returned until the next business day. Calls received after 4:30 pm will be answered by an off-site nursing triage line.    Prescription Refills: Please ask your pharmacy to contact us directly for most prescription requests. Please contact the office directly to refill narcotics (pain medications). Allow 48-72 hours for refills.  Appointments: Please contact the [facility] scheduling department at [phone] if you have questions about [facility] appointment scheduling. Please contact the schedulers with any scheduling changes so your appointment can be rescheduled in a timely manner.  Central Radiology Scheduling for [facility] [phone] - Call to schedule procedures such as PET scans, CT scans, MRIs, ultrasound, etc.  To allow each patient quality time with our providers, please arrive 15-30 minutes before your scheduled appointment time. If you are late for your appointment, you may be asked to reschedule. We strive to provide you with quality time with our providers, and arriving late affects you and other patients whose appointments are after yours. If you do not show up for multiple scheduled visits, you may be dismissed from the clinic at the provider's discretion.  Resources: [facility] Social Workers [phone] for additional information on assistance programs or assistance connecting with community support programs [place] DSS 6403933975: Information about food stamps, Medicaid, and utility assistance [employer] [phone] [employer] ride-sharing service for eligible riders who have a disability that prevents them from riding the fixed-route bus. Medicare Rights Center  312-686-4054 Helps people with Medicare understand their rights and benefits, navigate the Medicare system, and get the quality health care they deserve [employer] 908 713 9163 Helps patients locate various types of support and financial assistance Cancer Care: 1-800-813-HOPE 971-430-5098) Provides financial assistance, online support groups, medication/co-pay assistance. Transportation Assistance for [employer]: Please inform the provider's scheduler or support staff to refer you to [employer].  Again, thank you for choosing [facility] for your care

## 2023-10-13 LAB — KAPPA/LAMBDA LIGHT CHAINS
Kappa free light chain: 28.5 mg/L — ABNORMAL HIGH (ref 3.3–19.4)
Kappa, lambda light chain ratio: 1.02 (ref 0.26–1.65)
Lambda free light chains: 28 mg/L — ABNORMAL HIGH (ref 5.7–26.3)

## 2023-10-13 LAB — BETA 2 MICROGLOBULIN, SERUM: Beta-2 Microglobulin: 1.9 mg/L (ref 0.6–2.4)

## 2023-10-17 LAB — MULTIPLE MYELOMA PANEL, SERUM
Albumin SerPl Elph-Mcnc: 3.5 g/dL (ref 2.9–4.4)
Albumin/Glob SerPl: 0.9 (ref 0.7–1.7)
Alpha 1: 0.2 g/dL (ref 0.0–0.4)
Alpha2 Glob SerPl Elph-Mcnc: 1 g/dL (ref 0.4–1.0)
B-Globulin SerPl Elph-Mcnc: 1.2 g/dL (ref 0.7–1.3)
Gamma Glob SerPl Elph-Mcnc: 1.4 g/dL (ref 0.4–1.8)
Globulin, Total: 3.9 g/dL (ref 2.2–3.9)
IgA: 609 mg/dL — ABNORMAL HIGH (ref 64–422)
IgG (Immunoglobin G), Serum: 1128 mg/dL (ref 586–1602)
IgM (Immunoglobulin M), Srm: 532 mg/dL — ABNORMAL HIGH (ref 26–217)
Total Protein ELP: 7.4 g/dL (ref 6.0–8.5)

## 2023-10-25 ENCOUNTER — Ambulatory Visit: Payer: Medicare PPO

## 2023-10-25 DIAGNOSIS — Z1330 Encounter for screening examination for mental health and behavioral disorders, unspecified: Secondary | ICD-10-CM | POA: Diagnosis not present

## 2023-10-25 DIAGNOSIS — Z79899 Other long term (current) drug therapy: Secondary | ICD-10-CM | POA: Diagnosis not present

## 2023-10-25 DIAGNOSIS — R55 Syncope and collapse: Secondary | ICD-10-CM | POA: Diagnosis not present

## 2023-10-25 DIAGNOSIS — Z1211 Encounter for screening for malignant neoplasm of colon: Secondary | ICD-10-CM | POA: Diagnosis not present

## 2023-10-25 DIAGNOSIS — Z01419 Encounter for gynecological examination (general) (routine) without abnormal findings: Secondary | ICD-10-CM | POA: Diagnosis not present

## 2023-10-25 DIAGNOSIS — Z124 Encounter for screening for malignant neoplasm of cervix: Secondary | ICD-10-CM | POA: Diagnosis not present

## 2023-10-25 DIAGNOSIS — L900 Lichen sclerosus et atrophicus: Secondary | ICD-10-CM | POA: Diagnosis not present

## 2023-10-25 DIAGNOSIS — Z1231 Encounter for screening mammogram for malignant neoplasm of breast: Secondary | ICD-10-CM | POA: Diagnosis not present

## 2023-10-25 DIAGNOSIS — C9000 Multiple myeloma not having achieved remission: Secondary | ICD-10-CM | POA: Diagnosis not present

## 2023-10-25 DIAGNOSIS — Z6834 Body mass index (BMI) 34.0-34.9, adult: Secondary | ICD-10-CM | POA: Diagnosis not present

## 2023-10-25 DIAGNOSIS — Z13820 Encounter for screening for osteoporosis: Secondary | ICD-10-CM | POA: Diagnosis not present

## 2023-10-26 ENCOUNTER — Ambulatory Visit (HOSPITAL_COMMUNITY)
Admission: RE | Admit: 2023-10-26 | Discharge: 2023-10-26 | Disposition: A | Discharge status: Home / Self Care | Payer: Medicare PPO | Source: Ambulatory Visit | Attending: Hematology and Oncology | Admitting: Hematology and Oncology

## 2023-10-26 DIAGNOSIS — R768 Other specified abnormal immunological findings in serum: Secondary | ICD-10-CM | POA: Diagnosis not present

## 2023-10-26 DIAGNOSIS — D472 Monoclonal gammopathy: Secondary | ICD-10-CM | POA: Insufficient documentation

## 2023-10-26 DIAGNOSIS — D890 Polyclonal hypergammaglobulinemia: Secondary | ICD-10-CM | POA: Diagnosis not present

## 2023-10-26 DIAGNOSIS — M1991 Primary osteoarthritis, unspecified site: Secondary | ICD-10-CM | POA: Diagnosis not present

## 2023-10-26 DIAGNOSIS — Z6832 Body mass index (BMI) 32.0-32.9, adult: Secondary | ICD-10-CM | POA: Diagnosis not present

## 2023-10-26 DIAGNOSIS — M5432 Sciatica, left side: Secondary | ICD-10-CM | POA: Diagnosis not present

## 2023-10-26 DIAGNOSIS — M5431 Sciatica, right side: Secondary | ICD-10-CM | POA: Diagnosis not present

## 2023-10-26 DIAGNOSIS — E669 Obesity, unspecified: Secondary | ICD-10-CM | POA: Diagnosis not present

## 2023-10-26 DIAGNOSIS — M1009 Idiopathic gout, multiple sites: Secondary | ICD-10-CM | POA: Diagnosis not present

## 2023-10-26 DIAGNOSIS — R700 Elevated erythrocyte sedimentation rate: Secondary | ICD-10-CM | POA: Diagnosis not present

## 2023-10-27 LAB — CUP PACEART REMOTE DEVICE CHECK
Date Time Interrogation Session: 20241126112041
Implantable Pulse Generator Implant Date: 20230919

## 2023-11-02 LAB — UPEP/UIFE/LIGHT CHAINS/TP, 24-HR UR
% BETA, Urine: 37 %
ALPHA 1 URINE: 5.4 %
Albumin, U: 39.1 %
Alpha 2, Urine: 9 %
Free Kappa Lt Chains,Ur: 11.24 mg/L (ref 1.17–86.46)
Free Kappa/Lambda Ratio: 7.65 (ref 1.83–14.26)
Free Lambda Lt Chains,Ur: 1.47 mg/L (ref 0.27–15.21)
GAMMA GLOBULIN URINE: 9.4 %
Total Protein, Urine-Ur/day: 220 mg/(24.h) — ABNORMAL HIGH (ref 30–150)
Total Protein, Urine: 16.3 mg/dL
Total Volume: 1350

## 2023-11-03 ENCOUNTER — Other Ambulatory Visit: Payer: Self-pay | Admitting: Internal Medicine

## 2023-11-05 ENCOUNTER — Ambulatory Visit
Admission: RE | Admit: 2023-11-05 | Discharge: 2023-11-05 | Disposition: A | Discharge status: Home / Self Care | Payer: Medicare PPO | Source: Ambulatory Visit | Attending: Internal Medicine | Admitting: Internal Medicine

## 2023-11-05 ENCOUNTER — Ambulatory Visit: Payer: Medicare PPO

## 2023-11-05 DIAGNOSIS — Z1231 Encounter for screening mammogram for malignant neoplasm of breast: Secondary | ICD-10-CM

## 2023-11-16 DIAGNOSIS — Z13820 Encounter for screening for osteoporosis: Secondary | ICD-10-CM | POA: Diagnosis not present

## 2023-11-16 DIAGNOSIS — Z780 Asymptomatic menopausal state: Secondary | ICD-10-CM | POA: Diagnosis not present

## 2023-11-18 NOTE — Progress Notes (Signed)
Carelink Summary Report / Loop Recorder 

## 2023-11-18 NOTE — Addendum Note (Signed)
Addended by: Geralyn Flash D on: 11/18/2023 01:33 PM   Modules accepted: Orders

## 2023-11-29 ENCOUNTER — Ambulatory Visit (INDEPENDENT_AMBULATORY_CARE_PROVIDER_SITE_OTHER): Payer: Medicare PPO

## 2023-11-29 DIAGNOSIS — R55 Syncope and collapse: Secondary | ICD-10-CM

## 2023-11-29 LAB — CUP PACEART REMOTE DEVICE CHECK
Date Time Interrogation Session: 20241229231442
Implantable Pulse Generator Implant Date: 20230919

## 2023-12-07 ENCOUNTER — Ambulatory Visit (HOSPITAL_COMMUNITY)
Admission: RE | Admit: 2023-12-07 | Discharge: 2023-12-07 | Disposition: A | Discharge status: Home / Self Care | Payer: Medicare PPO | Source: Ambulatory Visit | Attending: Internal Medicine | Admitting: Internal Medicine

## 2023-12-07 ENCOUNTER — Encounter: Payer: Self-pay | Admitting: Internal Medicine

## 2023-12-07 ENCOUNTER — Ambulatory Visit: Payer: Medicare PPO | Admitting: Internal Medicine

## 2023-12-07 VITALS — BP 100/60 | HR 74 | Ht 63.0 in | Wt 182.0 lb

## 2023-12-07 DIAGNOSIS — M7918 Myalgia, other site: Secondary | ICD-10-CM | POA: Diagnosis not present

## 2023-12-07 DIAGNOSIS — M79651 Pain in right thigh: Secondary | ICD-10-CM | POA: Insufficient documentation

## 2023-12-07 MED ORDER — HYDROCODONE-ACETAMINOPHEN 5-325 MG PO TABS: 1.0000 | ORAL_TABLET | Freq: Four times a day (QID) | ORAL | 0 refills | Status: DC | PRN

## 2023-12-07 NOTE — Progress Notes (Signed)
 Patient Care Team: Perri Ronal PARAS, MD as PCP - General (Internal Medicine) Santo Stanly LABOR, MD as PCP - Cardiology (Cardiology) [name], MD as Consulting Physician (Ophthalmology)  Visit Date: 12/07/23  Subjective:   Chief Complaint  Patient presents with   Leg Pain    Right leg, started before Christmas. Now her lower back is hurting.    Patient PI:Nancy Wagner,Female DOB:December 29, 1950,73 y.o.FMW:[id number]   73 y.o. Female presents today for acute visit with Right Leg Pain causing Difficulty Ambulating. Patient has a past medical history of Diabetes Mellitus with Coincident Hypertension, Mixed HLD, Mobitz Type 2 Second Degree Heart Block, PVC (Premature Ventricular Contraction), and Aortic Atherosclerosis. She reports that her pain began shortly before 12/25. She has tried OTC to relieve her pain. Endorses diffuse pain in right thigh. She isn't sure if she hasn't overextended/exerted her leg or otherwise injured it.   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   GERD (gastroesophageal reflux disease)    diet control   Gout    12-08-2019  per pt last episode left foot 11/ 2020   Hirsutism    History of obstructive sleep apnea    s/p  UPPP in 1985   Hypertension    followed by pcp  (12-08-2019  per pt had stress test many yrs ago, was told ok)   Lichen simplex chronicus    OA (osteoarthritis)    RA (rheumatoid arthritis) (HCC)    12-08-2019  per pt was dx years ago, treated with high level prednisone  for few weeks and some other treatment with her eyes and has been in remission since   Type 2 diabetes mellitus (HCC)    followed by pcp ---  (12-08-2019 checks cbg's daily in am,  fasting-- 110 to 126)   Uterine fibroid    Wears glasses     Family History  Problem Relation Age of Onset   Heart disease Sister    Social History    Social History Narrative   Not on file   Review of Systems  Constitutional:  Negative for fever and malaise/fatigue.  HENT:  Negative for congestion.   Eyes:  Negative for blurred vision.  Respiratory:  Negative for cough and shortness of breath.   Cardiovascular:  Negative for chest pain, palpitations and leg swelling.  Gastrointestinal:  Negative for vomiting.  Musculoskeletal:  Positive for myalgias (right thigh, diffuse). Negative for back pain.  Skin:  Negative for rash.  Neurological:  Negative for loss of consciousness and headaches.     Objective:  Vitals: BP 100/60   Pulse 74   Ht 5' 3 (1.6 m)   Wt 182 lb (82.6 kg)   SpO2 97%   BMI 32.24 kg/m  Physical Exam Vitals and nursing note reviewed.  Constitutional:      General: She is not in acute distress.    Appearance: Normal appearance. She is not toxic-appearing.  HENT:     Head: Normocephalic and atraumatic.  Cardiovascular:     Pulses:          Dorsalis pedis pulses are 1+ on the right side.  Pulmonary:     Effort: Pulmonary effort is normal.  Musculoskeletal:     Right upper leg: Tenderness (diffuse tenderness of medial aspect of right thigh) present. No edema.  Feet:     Comments:  Post Tibialis: pulses not detected, but not unusual for me  Skin:    General: Skin is warm and dry.  Neurological:     Mental Status: She is alert and oriented to person, place, and time. Mental status is at baseline.  Psychiatric:        Mood and Affect: Mood normal.        Behavior: Behavior normal.        Thought Content: Thought content normal.        Judgment: Judgment normal.     Results:  Studies Obtained And Personally Reviewed By Me: Labs:     Component Value Date/Time   NA 140 10/12/2023 1051   K 3.9 10/12/2023 1051   CL 104 10/12/2023 1051   CO2 30 10/12/2023 1051   GLUCOSE 198 (H) 10/12/2023 1051   BUN 15 10/12/2023 1051   CREATININE 1.09 (H) 10/12/2023 1051   CREATININE 1.21 (H) 09/27/2023 1151    CALCIUM  9.6 10/12/2023 1051   PROT 7.6 10/12/2023 1051   ALBUMIN 3.9 10/12/2023 1051   AST 15 10/12/2023 1051   ALT 20 10/12/2023 1051   ALKPHOS 46 10/12/2023 1051   BILITOT 0.3 10/12/2023 1051   GFRNONAA 54 (L) 10/12/2023 1051   GFRNONAA 50 (L) 05/29/2021 0918   GFRAA 58 (L) 05/29/2021 0918    Lab Results  Component Value Date   WBC 6.6 10/12/2023   HGB 11.4 (L) 10/12/2023   HCT 34.1 (L) 10/12/2023   MCV 91.4 10/12/2023   PLT 182 10/12/2023   Lab Results  Component Value Date   CHOL 171 06/11/2023   HDL 72 06/11/2023   LDLCALC 82 06/11/2023   TRIG 87 06/11/2023   CHOLHDL 2.4 06/11/2023   Lab Results  Component Value Date   HGBA1C 6.9 (H) 06/11/2023    Lab Results  Component Value Date   TSH 0.72 09/24/2023   Assessment & Plan:   Acute Right Thigh Pain: Sending in 5-325 mg Norco for pain (5 day supply). Ordered Doppler to r/o DVT, would like to have this done either today or tomorrow. If there's not a clot, may need to consider physical therapy.   Addendum: Received phone call that doppler is negative for DVT. Make referral for PT  I,Emily Lagle,acting as a scribe for Ronal JINNY Hailstone, MD.,have documented all relevant documentation on the behalf of Ronal JINNY Hailstone, MD,as directed by  Ronal JINNY Hailstone, MD while in the presence of Ronal JINNY Hailstone, MD.   I, Ronal JINNY Hailstone, MD, have reviewed all documentation for this visit. The documentation on 12/12/23 for the exam, diagnosis, procedures, and orders are all accurate and complete.

## 2023-12-07 NOTE — Patient Instructions (Addendum)
 Patient having pain in right popliteal area. Needs Doppler study to rule out DVT. This has been ordered. Study is negative for DVT. Patient contacted with result. Sending her for PT for musculoskeletal pain right leg. Have sent in Rx for Norco 5/325 for pain( 5 day supply)

## 2023-12-12 ENCOUNTER — Encounter: Payer: Self-pay | Admitting: Internal Medicine

## 2023-12-20 ENCOUNTER — Other Ambulatory Visit: Payer: Medicare PPO

## 2023-12-24 ENCOUNTER — Other Ambulatory Visit: Payer: Medicare PPO

## 2023-12-24 DIAGNOSIS — E1169 Type 2 diabetes mellitus with other specified complication: Secondary | ICD-10-CM

## 2023-12-24 DIAGNOSIS — E785 Hyperlipidemia, unspecified: Secondary | ICD-10-CM | POA: Diagnosis not present

## 2023-12-24 DIAGNOSIS — I10 Essential (primary) hypertension: Secondary | ICD-10-CM

## 2023-12-24 DIAGNOSIS — K862 Cyst of pancreas: Secondary | ICD-10-CM | POA: Diagnosis not present

## 2023-12-24 DIAGNOSIS — E119 Type 2 diabetes mellitus without complications: Secondary | ICD-10-CM

## 2023-12-25 LAB — HEPATIC FUNCTION PANEL
AG Ratio: 1.1 (calc) (ref 1.0–2.5)
ALT: 16 U/L (ref 6–29)
AST: 21 U/L (ref 10–35)
Albumin: 4.2 g/dL (ref 3.6–5.1)
Alkaline phosphatase (APISO): 47 U/L (ref 37–153)
Bilirubin, Direct: 0.1 mg/dL (ref 0.0–0.2)
Globulin: 3.7 g/dL (ref 1.9–3.7)
Indirect Bilirubin: 0.3 mg/dL (ref 0.2–1.2)
Total Bilirubin: 0.4 mg/dL (ref 0.2–1.2)
Total Protein: 7.9 g/dL (ref 6.1–8.1)

## 2023-12-25 LAB — HEMOGLOBIN A1C
Hgb A1c MFr Bld: 7.8 %{Hb} — ABNORMAL HIGH (ref <5.7)
Mean Plasma Glucose: 177 mg/dL
eAG (mmol/L): 9.8 mmol/L

## 2023-12-25 LAB — MICROALBUMIN / CREATININE URINE RATIO
Creatinine, Urine: 175 mg/dL (ref 20–275)
Microalb Creat Ratio: 8 mg/g{creat} (ref <30)
Microalb, Ur: 1.4 mg/dL

## 2023-12-25 LAB — LIPID PANEL
Cholesterol: 151 mg/dL (ref <200)
HDL: 69 mg/dL (ref >=50)
LDL Cholesterol (Calc): 63 mg/dL
Non-HDL Cholesterol (Calc): 82 mg/dL (ref <130)
Total CHOL/HDL Ratio: 2.2 (calc) (ref <5.0)
Triglycerides: 104 mg/dL (ref <150)

## 2023-12-27 ENCOUNTER — Encounter: Payer: Self-pay | Admitting: Internal Medicine

## 2023-12-27 ENCOUNTER — Encounter: Payer: Self-pay | Admitting: Gastroenterology

## 2023-12-27 ENCOUNTER — Ambulatory Visit: Payer: Medicare PPO | Admitting: Internal Medicine

## 2023-12-27 VITALS — BP 110/80 | HR 74 | Ht 63.0 in | Wt 181.0 lb

## 2023-12-27 DIAGNOSIS — E785 Hyperlipidemia, unspecified: Secondary | ICD-10-CM

## 2023-12-27 DIAGNOSIS — I10 Essential (primary) hypertension: Secondary | ICD-10-CM

## 2023-12-27 DIAGNOSIS — Z87898 Personal history of other specified conditions: Secondary | ICD-10-CM

## 2023-12-27 DIAGNOSIS — I441 Atrioventricular block, second degree: Secondary | ICD-10-CM

## 2023-12-27 DIAGNOSIS — G4730 Sleep apnea, unspecified: Secondary | ICD-10-CM | POA: Diagnosis not present

## 2023-12-27 DIAGNOSIS — D890 Polyclonal hypergammaglobulinemia: Secondary | ICD-10-CM | POA: Diagnosis not present

## 2023-12-27 DIAGNOSIS — R21 Rash and other nonspecific skin eruption: Secondary | ICD-10-CM

## 2023-12-27 DIAGNOSIS — M1712 Unilateral primary osteoarthritis, left knee: Secondary | ICD-10-CM

## 2023-12-27 DIAGNOSIS — Z8739 Personal history of other diseases of the musculoskeletal system and connective tissue: Secondary | ICD-10-CM | POA: Diagnosis not present

## 2023-12-27 DIAGNOSIS — E1169 Type 2 diabetes mellitus with other specified complication: Secondary | ICD-10-CM | POA: Diagnosis not present

## 2023-12-27 DIAGNOSIS — M79651 Pain in right thigh: Secondary | ICD-10-CM | POA: Diagnosis not present

## 2023-12-27 MED ORDER — CELECOXIB 100 MG PO CAPS: 100.0000 mg | ORAL_CAPSULE | Freq: Two times a day (BID) | ORAL | 2 refills | Status: DC

## 2023-12-27 NOTE — Progress Notes (Addendum)
Patient Care Team: Margaree Mackintosh, MD as PCP - General (Internal Medicine) Christell Constant, MD as PCP - Cardiology (Cardiology) Janet Berlin, MD as Consulting Physician (Ophthalmology)  Visit Date: 12/27/23  Subjective:   Chief Complaint  Patient presents with   Medical Management of Chronic Issues   Hyperlipidemia   Hypertension   Diabetes   Rash    Left shoulder    Patient OA:CZYSAYT Ballinas,Female DOB:[birth year],73 y.o. KZS:[id number]   73 y.o. Female presents today for 6 months follow-up for HLD, HTN, DM II. Recently seen 1/7 for Acute Pain of the Right Thigh, her doppler was NEG for DVT. She reports that she is still having this pain, which has been affecting her capability to exercise. Has run out of the 5 day supply of 10-325 mg    Reports a Rash on her Left Shoulder. Has tried contacting her dermatologist at [facility], but will not be able to get an appointment soon, and is willing to establish with someone else.     History of hypertension treated with olmesartan-hydrochlorothiazide 40-25 mg daily. Blood Pressure today normotensive at 110/80.    History of hyperlipidemia treated with simvastatin 40 mg at bedtime. 12/24/23 Lipid Panel WNL    History of Type 2 diabetes mellitus treated with glipizide 10 mg daily with breakfast, Janumet [phone] mg daily with breakfast. 12/24/23 HgbA1c 7.8, increased from 6.9 on 06/11/23 which may be related to the fact she hasn't been as active recently due to her leg pain.   Reviewed Microalbumin/Creatinine Urine Ratio which was WNL and Hepatic Function Panel was also WNL.    History of rheumatoid arthritis, osteoarthritis. Sees Dr. Nickola Major at [facility] rheumatology and was told that her swelling isn't too severe. She has been trying to exercise but finds it difficult due to her right leg pain. Discussed physical therapy for leg pain and orthopedist for knee. Bought OTC Ibuprofen after running out of Norco.    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  Vaccine Counseling: Due for Shingles, but she would like to post-pone this until her rash clears.   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   GERD (gastroesophageal reflux disease)    diet control   Gout    12-08-2019  per pt last episode left foot 11/ 2020   Hirsutism    History of obstructive sleep apnea    s/p  UPPP in 1985   Hypertension    followed by pcp  (12-08-2019  per pt had stress test many yrs ago, was told ok)   Lichen simplex chronicus    OA (osteoarthritis)    RA (rheumatoid arthritis) (HCC)    12-08-2019  per pt was dx years ago, treated with high level prednisone for few weeks and some other treatment with her eyes and has been in remission since   Type 2 diabetes mellitus (HCC)    followed by pcp ---  (12-08-2019 checks cbg's daily in am,  fasting-- 110 to 126)   Uterine fibroid  Wears glasses   History of GERD treated with esomeprazole 40 mg daily.   History of gout treated with allopurinol 100 mg daily.   History of muscle spasms treated with cyclobenzaprine 10 mg as needed.  History of obstructive sleep apnea status post UPPP 1985.  Family History  Problem Relation Age of Onset   Heart disease Sister    Social History   Social History Narrative   Not on file   Review of Systems  Constitutional:  Negative for fever and malaise/fatigue.  HENT:  Negative for congestion.   Eyes:  Negative  for blurred vision.  Respiratory:  Negative for cough and shortness of breath.   Cardiovascular:  Negative for chest pain, palpitations and leg swelling.  Gastrointestinal:  Negative for vomiting.  Musculoskeletal:  Positive for joint pain (knees) and myalgias (Right Leg Pain, thigh). Negative for back pain.  Skin:  Positive for itching (left shoulder) and rash (left shoulder).  Neurological:  Negative for loss of consciousness and headaches.     Objective:  Vitals: BP 110/80   Pulse 74   Ht 5\' 3"  (1.6 m)   Wt 181 lb (82.1 kg)   SpO2 98%   BMI 32.06 kg/m   Physical Exam Vitals and nursing note reviewed.  Constitutional:      General: She is not in acute distress.    Appearance: Normal appearance. She is not toxic-appearing.  HENT:     Head: Normocephalic and atraumatic.  Cardiovascular:     Rate and Rhythm: Normal rate and regular rhythm. No extrasystoles are present.    Pulses: Normal pulses.     Heart sounds: Normal heart sounds. No murmur heard.    No friction rub. No gallop.  Pulmonary:     Effort: Pulmonary effort is normal. No respiratory distress.     Breath sounds: Normal breath sounds. No wheezing or rales.  Musculoskeletal:     Comments: Pain around palpation inner thigh   Skin:    General: Skin is warm and dry.  Neurological:     Mental Status: She is alert and oriented to person, place, and time. Mental status is at baseline.  Psychiatric:        Mood and Affect: Mood normal.        Behavior: Behavior normal.        Thought Content: Thought content normal.        Judgment: Judgment normal.    Results:  Studies Obtained And Personally Reviewed By Me:  VAS [place] LOWER EXTREMITY VENOUS (DVT) RIGHT 12/07/23  RIGHT: - There is no evidence of deep vein thrombosis in the lower extremity.  - No cystic structure found in the popliteal fossa.  LEFT: No evidence of common femoral vein obstruction.  Labs:     Component Value Date/Time   NA 140 10/12/2023 1051   K  3.9 10/12/2023 1051   CL 104 10/12/2023 1051   CO2 30 10/12/2023 1051   GLUCOSE 198 (H) 10/12/2023 1051   BUN 15 10/12/2023 1051   CREATININE 1.09 (H) 10/12/2023 1051   CREATININE 1.21 (H) 09/27/2023 1151   CALCIUM 9.6 10/12/2023 1051   PROT 7.9 12/24/2023 0924   ALBUMIN 3.9 10/12/2023 1051   AST 21 12/24/2023 0924   AST 15 10/12/2023 1051   ALT 16 12/24/2023 0924   ALT 20 10/12/2023 1051   ALKPHOS 46 10/12/2023 1051   BILITOT 0.4 12/24/2023 0924   BILITOT 0.3 10/12/2023 1051   GFRNONAA 54 (L) 10/12/2023 1051  GFRNONAA 50 (L) 05/29/2021 0918   GFRAA 58 (L) 05/29/2021 0918    Lab Results  Component Value Date   WBC 6.6 10/12/2023   HGB 11.4 (L) 10/12/2023   HCT 34.1 (L) 10/12/2023   MCV 91.4 10/12/2023   PLT 182 10/12/2023   Lab Results  Component Value Date   CHOL 151 12/24/2023   HDL 69 12/24/2023   LDLCALC 63 12/24/2023   TRIG 104 12/24/2023   CHOLHDL 2.2 12/24/2023   Lab Results  Component Value Date   HGBA1C 7.8 (H) 12/24/2023    Lab Results  Component Value Date   TSH 0.72 09/24/2023   Assessment & Plan:  Reviewed 12/24/23 Microalbumin/Creatinine Urine Ratio which was WNL and Hepatic Function Panel was also WNL.   seen 1/7 for Acute Pain of the Right Thigh, her doppler was NEG for DVT. She reports that she is still having this pain, which has been affecting her capability to exercise. Has run out of the 5 day supply of 5-325 mg Norco - will send in 5 day supply of 10-325 mg Norco. Discussed physical therapy.       Rash, Left Shoulder. Has tried contacting her dermatologist at [facility], but will not be able to get an appointment soon, and is willing to establish with someone else. Recommended Nita Sells, MD, dermatologist.      Hypertension treated with olmesartan-hydrochlorothiazide 40-25 mg daily. Blood Pressure today normotensive at 110/80.    Hyperlipidemia treated with simvastatin 40 mg at bedtime. 12/24/23 Lipid Panel WNL    Type 2  diabetes mellitus treated with glipizide 10 mg daily with breakfast, Janumet [phone] mg daily with breakfast. 12/24/23 HgbA1c 7.8, increased from 6.9 on 06/11/23 which may be related to the fact she hasn't been as active recently due to her leg pain.    Rheumatoid arthritis//Osteoarthritis. Sees Dr. Nickola Major at [facility] rheumatology. Discussed orthopedist. 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  Vaccine Counseling: Due for Shingles vaccine - she would like to post-pone this until her rash clears.      I,Emily Lagle,acting as a Neurosurgeon for Margaree Mackintosh, MD.,have documented all relevant documentation on the behalf of Margaree Mackintosh, MD,as directed by  Margaree Mackintosh, MD while in the presence of Margaree Mackintosh, MD.   I, Margaree Mackintosh, MD, have reviewed all documentation for this visit. The documentation on 12/31/23 for the exam, diagnosis, procedures, and orders are all accurate and complete.

## 2023-12-28 MED ORDER — HYDROCODONE-ACETAMINOPHEN 10-325 MG PO TABS: 1.0000 | ORAL_TABLET | Freq: Four times a day (QID) | ORAL | 0 refills | Status: DC | PRN

## 2023-12-31 NOTE — Patient Instructions (Addendum)
Recommend Dermatologist for shoulder rash. BP is stable. Continue current meds. Continue follow up with Rheumatology. Medicare wellness visit due July 2025

## 2024-01-03 ENCOUNTER — Ambulatory Visit (INDEPENDENT_AMBULATORY_CARE_PROVIDER_SITE_OTHER): Payer: Medicare PPO

## 2024-01-03 DIAGNOSIS — R55 Syncope and collapse: Secondary | ICD-10-CM | POA: Diagnosis not present

## 2024-01-03 DIAGNOSIS — I441 Atrioventricular block, second degree: Secondary | ICD-10-CM

## 2024-01-03 LAB — CUP PACEART REMOTE DEVICE CHECK
Date Time Interrogation Session: 20250202231529
Implantable Pulse Generator Implant Date: 20230919

## 2024-01-06 DIAGNOSIS — M25561 Pain in right knee: Secondary | ICD-10-CM | POA: Diagnosis not present

## 2024-01-06 DIAGNOSIS — M79604 Pain in right leg: Secondary | ICD-10-CM | POA: Diagnosis not present

## 2024-01-06 DIAGNOSIS — M170 Bilateral primary osteoarthritis of knee: Secondary | ICD-10-CM | POA: Diagnosis not present

## 2024-01-11 DIAGNOSIS — R208 Other disturbances of skin sensation: Secondary | ICD-10-CM | POA: Diagnosis not present

## 2024-01-12 ENCOUNTER — Ambulatory Visit: Payer: Medicare PPO | Admitting: Orthopaedic Surgery

## 2024-01-15 ENCOUNTER — Other Ambulatory Visit: Payer: Self-pay | Admitting: Internal Medicine

## 2024-01-18 DIAGNOSIS — M1711 Unilateral primary osteoarthritis, right knee: Secondary | ICD-10-CM | POA: Diagnosis not present

## 2024-01-26 DIAGNOSIS — M1711 Unilateral primary osteoarthritis, right knee: Secondary | ICD-10-CM | POA: Diagnosis not present

## 2024-02-02 DIAGNOSIS — M1711 Unilateral primary osteoarthritis, right knee: Secondary | ICD-10-CM | POA: Diagnosis not present

## 2024-02-04 ENCOUNTER — Other Ambulatory Visit: Payer: Self-pay | Admitting: Internal Medicine

## 2024-02-07 ENCOUNTER — Ambulatory Visit: Payer: Medicare PPO

## 2024-02-07 DIAGNOSIS — R55 Syncope and collapse: Secondary | ICD-10-CM | POA: Diagnosis not present

## 2024-02-07 LAB — CUP PACEART REMOTE DEVICE CHECK
Date Time Interrogation Session: 20250309231605
Implantable Pulse Generator Implant Date: 20230919

## 2024-02-08 NOTE — Progress Notes (Signed)
 [facility] Summary Report / Loop Recorder

## 2024-02-28 ENCOUNTER — Other Ambulatory Visit: Payer: Self-pay | Admitting: Internal Medicine

## 2024-03-09 NOTE — Progress Notes (Signed)
 [facility] Summary Report / Loop Recorder

## 2024-03-10 ENCOUNTER — Other Ambulatory Visit: Payer: Self-pay | Admitting: Internal Medicine

## 2024-03-13 ENCOUNTER — Ambulatory Visit (INDEPENDENT_AMBULATORY_CARE_PROVIDER_SITE_OTHER): Payer: Medicare PPO

## 2024-03-13 DIAGNOSIS — R55 Syncope and collapse: Secondary | ICD-10-CM

## 2024-03-13 LAB — CUP PACEART REMOTE DEVICE CHECK
Date Time Interrogation Session: 20250413231443
Implantable Pulse Generator Implant Date: 20230919

## 2024-03-22 ENCOUNTER — Ambulatory Visit (INDEPENDENT_AMBULATORY_CARE_PROVIDER_SITE_OTHER): Payer: Medicare PPO | Admitting: Gastroenterology

## 2024-03-22 ENCOUNTER — Encounter: Payer: Self-pay | Admitting: Gastroenterology

## 2024-03-22 VITALS — BP 120/62 | HR 80 | Ht 63.0 in | Wt 181.0 lb

## 2024-03-22 DIAGNOSIS — K862 Cyst of pancreas: Secondary | ICD-10-CM

## 2024-03-22 NOTE — Progress Notes (Addendum)
 [facility] Progress Note  Chief Complaint: Pancreatic cyst  Subjective  Prior history  Former Dr. Howard Macho patient. Pancreatic cyst: Was stable on MRI from 2019-2020 and then now from 2020-2022.  Recommendation was that if it was stable for 5 years with no significant change then surveillance would no longer be needed.  We will plan for another MRI of the abdomen in February 2024 and if stable no further imaging from that time. " (Following 2015 AGA guidelines)   Leo had a screening colonoscopy with Dr. Howard Macho in June 2019 (age 73), and no polyps were found.  10-year recall recommended.  [facility] IPMN stable on February 2024 MRI.  Office visit a month later, details of recommendations in that note.  2-year MRI recall recommended.   History of Present Illness  Shandiin thought she might be due for her MRI and wanted to discuss it today. No new digestive symptoms since I last saw her.  She says sometimes her appetite comes and goes, but that is not something new for her.  She also has not been one to have daily bowel movements, but that is also been her pattern for many years.  Denies nausea vomiting weight loss rectal bleeding.  (Over 30 minutes late today, having mistakenly gone to the wrong building for the visit)  ROS: Cardiovascular:  no chest pain Respiratory: no dyspnea Lately she has had episodic lightheadedness (without spinning sensation), sometimes (but not always) with position changes.  Says she has a blood pressure machine at home and checks it regularly and says it is good.  Has not communicated with primary care about this, stating "I get tired of going to the doctor".  The patient's Past Medical, Family and Social History were reviewed and are on file in the EMR. 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   GERD  (gastroesophageal reflux disease)    diet control   Gout    12-08-2019  per pt last episode left foot 11/ 2020   Hirsutism    History of obstructive sleep apnea    s/p  UPPP in 1985   Hypertension    followed by pcp  (12-08-2019  per pt had stress test many yrs ago, was told ok)   Lichen simplex chronicus    OA (osteoarthritis)    RA (rheumatoid arthritis) (HCC)    12-08-2019  per pt was dx years ago, treated with high level prednisone  for few weeks and some other treatment with her eyes and has been in remission since   Type 2 diabetes mellitus (HCC)    followed by pcp ---  (12-08-2019 checks cbg's daily in am,  fasting-- 110 to 126)   Uterine fibroid    Wears glasses     Past Surgical History:  Procedure Laterality Date   CARPAL TUNNEL RELEASE Right    CESAREAN SECTION  1975, 1978   CHOLECYSTECTOMY OPEN  1984   COLONOSCOPY  last one 05-09-2018   DILATATION & CURRETTAGE/HYSTEROSCOPY WITH RESECTOCOPE N/A 04/07/2017   Procedure: DILATATION & CURETTAGE/HYSTEROSCOPY WITH RESECTOCOPE;  Surgeon: Stevenson Elbe, MD;  Location: [facility];  Service: Gynecology;  Laterality: N/A;   ROBOTIC ASSISTED SALPINGO OOPHERECTOMY Bilateral 12/14/2019   Procedure: XI ROBOTIC ASSISTED SALPINGO OOPHORECTOMY;  Surgeon: Ona Bidding, MD;  Location: [facility];  Service: Gynecology;  Laterality:  Bilateral;  Bed held, but patient is expected to go home the same day. -ap   THROAT SURGERY  1985   minimal uvulopalatopharyngoplasty for sleep apnea   TUBAL LIGATION Bilateral 1981     Objective:  Med list reviewed  Current Outpatient Medications:    albuterol  (VENTOLIN  HFA) 108 (90 Base) MCG/ACT inhaler, TAKE 2 PUFFS BY MOUTH EVERY 6 HOURS AS NEEDED FOR WHEEZE OR SHORTNESS OF BREATH, Disp: 8.5 each, Rfl: prn   allopurinol  (ZYLOPRIM ) 100 MG tablet, TAKE 1 TABLET BY MOUTH EVERY DAY, Disp: 90 tablet, Rfl: 1   amLODipine  (NORVASC ) 10 MG tablet, Take 1 tablet (10 mg total) by mouth daily.  Please call the office to schedule an visit for further refills. Thank you, Disp: 30 tablet, Rfl: 0   Blood Glucose Monitoring Suppl (ACCU-CHEK GUIDE ME) w/Device KIT, USE AS DIRECTED, Disp: 1 kit, Rfl: prn   celecoxib  (CELEBREX ) 100 MG capsule, Take 1 capsule (100 mg total) by mouth 2 (two) times daily., Disp: 60 capsule, Rfl: 2   cetirizine (ZYRTEC) 10 MG tablet, Take 10 mg by mouth as needed., Disp: , Rfl:    Cholecalciferol (VITAMIN D3) 50 MCG (2000 UT) TABS, Take 1 tablet by mouth daily., Disp: , Rfl:    cloNIDine  (CATAPRES ) 0.1 MG tablet, TAKE 1 TABLET BY MOUTH TWICE A DAY, Disp: 180 tablet, Rfl: 3   esomeprazole  (NEXIUM ) 40 MG capsule, TAKE 1 CAPSULE BY MOUTH EVERY DAY, Disp: 90 capsule, Rfl: 1   fish oil-omega-3 fatty acids 1000 MG capsule, Take 2 g by mouth at bedtime. Reported on 06/18/2016 Taking 1 tablet, Disp: , Rfl:    folic acid (FOLVITE) 400 MCG tablet, Take 400 mcg by mouth at bedtime. , Disp: , Rfl:    glipiZIDE  (GLUCOTROL  XL) 10 MG 24 hr tablet, TAKE 1 TABLET (10 MG TOTAL) BY MOUTH DAILY WITH BREAKFAST., Disp: 90 tablet, Rfl: 3   HYDROcodone -acetaminophen  (NORCO) 10-325 MG tablet, Take 1 tablet by mouth every 6 (six) hours as needed., Disp: 30 tablet, Rfl: 0   JANUMET  XR [phone] MG TB24, TAKE 1 TABLET BY MOUTH EVERY DAY WITH BREAKFAST, Disp: 90 tablet, Rfl: 3   olmesartan -hydrochlorothiazide (BENICAR  HCT) 40-25 MG tablet, TAKE 1 TABLET BY MOUTH EVERY DAY, Disp: 90 tablet, Rfl: 1   simvastatin  (ZOCOR ) 40 MG tablet, TAKE 1 TABLET BY MOUTH EVERYDAY AT BEDTIME, Disp: 90 tablet, Rfl: 3   triamcinolone  cream (KENALOG ) 0.1 %, Apply 1 Application topically 3 (three) times daily., Disp: 30 g, Rfl: 0   Vital signs in last 24 hrs: Vitals:   03/22/24 0957  BP: 120/62  Pulse: 80   Wt Readings from Last 3 Encounters:  03/22/24 181 lb (82.1 kg)  12/27/23 181 lb (82.1 kg)  12/07/23 182 lb (82.6 kg)    Physical Exam Not lightheaded standing or with position changes getting on or off  exam table. Well-appearing Cardiac: Regular without appreciable murmur,  no peripheral edema Pulm: clear to auscultation bilaterally, normal RR and effort noted Abdomen: soft, no tenderness, with active bowel sounds. No guarding or palpable hepatosplenomegaly.   Labs:   ___________________________________________ Radiologic studies:   ____________________________________________ Other:   _____________________________________________   Encounter Diagnosis  Name Primary?   Pancreatic cyst Yes    Assessment and Plan Assessment & Plan   I reviewed our conversation from last year and reassured her that she is due for an MRI of the pancreas next year, and that we have her on recall for that.  I encouraged her to  communicate with primary care and/or cardiologist about these episodes of lightheadedness that she is having.  (History of heart block with pacemaker)   20 minutes were spent on this encounter (including chart review, history/exam, counseling/coordination of care, and documentation) > 50% of that time was spent on counseling and coordination of care.   Kerby Pearson III

## 2024-03-22 NOTE — Patient Instructions (Signed)
_______________________________________________________  If your blood pressure at your visit was 140/90 or greater, please contact your primary care physician to follow up on this.  _______________________________________________________  If you are age 75 or older, your body mass index should be between 23-30. Your Body mass index is 32.06 kg/m. If this is out of the aforementioned range listed, please consider follow up with your Primary Care Provider.  If you are age 80 or younger, your body mass index should be between 19-25. Your Body mass index is 32.06 kg/m. If this is out of the aformentioned range listed, please consider follow up with your Primary Care Provider.   ________________________________________________________  The [facility] providers would like to encourage you to use [facility] to communicate with providers for non-urgent requests or questions.  Due to long hold times on the telephone, sending your provider a message by [facility] may be a faster and more efficient way to get a response.  Please allow 48 business hours for a response.  Please remember that this is for non-urgent requests.  _______________________________________________________

## 2024-04-01 ENCOUNTER — Other Ambulatory Visit: Payer: Self-pay | Admitting: Internal Medicine

## 2024-04-03 ENCOUNTER — Other Ambulatory Visit: Payer: Self-pay | Admitting: Physician Assistant

## 2024-04-03 ENCOUNTER — Inpatient Hospital Stay: Payer: Medicare PPO | Attending: Internal Medicine

## 2024-04-03 DIAGNOSIS — D472 Monoclonal gammopathy: Secondary | ICD-10-CM | POA: Insufficient documentation

## 2024-04-03 DIAGNOSIS — D649 Anemia, unspecified: Secondary | ICD-10-CM | POA: Diagnosis not present

## 2024-04-03 DIAGNOSIS — Z79899 Other long term (current) drug therapy: Secondary | ICD-10-CM | POA: Insufficient documentation

## 2024-04-03 LAB — CMP (CANCER CENTER ONLY)
ALT: 19 U/L (ref 0–44)
AST: 21 U/L (ref 15–41)
Albumin: 4.3 g/dL (ref 3.5–5.0)
Alkaline Phosphatase: 51 U/L (ref 38–126)
Anion gap: 6 (ref 5–15)
BUN: 19 mg/dL (ref 8–23)
CO2: 29 mmol/L (ref 22–32)
Calcium: 10 mg/dL (ref 8.9–10.3)
Chloride: 104 mmol/L (ref 98–111)
Creatinine: 1.13 mg/dL — ABNORMAL HIGH (ref 0.44–1.00)
GFR, Estimated: 51 mL/min — ABNORMAL LOW (ref >60)
Glucose, Bld: 134 mg/dL — ABNORMAL HIGH (ref 70–99)
Potassium: 4.2 mmol/L (ref 3.5–5.1)
Sodium: 139 mmol/L (ref 135–145)
Total Bilirubin: 0.3 mg/dL (ref 0.0–1.2)
Total Protein: 8.3 g/dL — ABNORMAL HIGH (ref 6.5–8.1)

## 2024-04-03 LAB — CBC WITH DIFFERENTIAL (CANCER CENTER ONLY)
Abs Immature Granulocytes: 0.01 10*3/uL (ref 0.00–0.07)
Basophils Absolute: 0.1 10*3/uL (ref 0.0–0.1)
Basophils Relative: 1 %
Eosinophils Absolute: 0.4 10*3/uL (ref 0.0–0.5)
Eosinophils Relative: 6 %
HCT: 34.5 % — ABNORMAL LOW (ref 36.0–46.0)
Hemoglobin: 11.4 g/dL — ABNORMAL LOW (ref 12.0–15.0)
Immature Granulocytes: 0 %
Lymphocytes Relative: 40 %
Lymphs Abs: 2.7 10*3/uL (ref 0.7–4.0)
MCH: 30 pg (ref 26.0–34.0)
MCHC: 33 g/dL (ref 30.0–36.0)
MCV: 90.8 fL (ref 80.0–100.0)
Monocytes Absolute: 0.5 10*3/uL (ref 0.1–1.0)
Monocytes Relative: 8 %
Neutro Abs: 3.1 10*3/uL (ref 1.7–7.7)
Neutrophils Relative %: 45 %
Platelet Count: 253 10*3/uL (ref 150–400)
RBC: 3.8 MIL/uL — ABNORMAL LOW (ref 3.87–5.11)
RDW: 14.1 % (ref 11.5–15.5)
WBC Count: 6.7 10*3/uL (ref 4.0–10.5)
nRBC: 0 % (ref 0.0–0.2)

## 2024-04-04 LAB — KAPPA/LAMBDA LIGHT CHAINS
Kappa free light chain: 45.4 mg/L — ABNORMAL HIGH (ref 3.3–19.4)
Kappa, lambda light chain ratio: 1.19 (ref 0.26–1.65)
Lambda free light chains: 38.3 mg/L — ABNORMAL HIGH (ref 5.7–26.3)

## 2024-04-06 LAB — MULTIPLE MYELOMA PANEL, SERUM
Albumin SerPl Elph-Mcnc: 4.1 g/dL (ref 2.9–4.4)
Albumin/Glob SerPl: 1.1 (ref 0.7–1.7)
Alpha 1: 0.2 g/dL (ref 0.0–0.4)
Alpha2 Glob SerPl Elph-Mcnc: 0.9 g/dL (ref 0.4–1.0)
B-Globulin SerPl Elph-Mcnc: 1.1 g/dL (ref 0.7–1.3)
Gamma Glob SerPl Elph-Mcnc: 1.6 g/dL (ref 0.4–1.8)
Globulin, Total: 3.9 g/dL (ref 2.2–3.9)
IgA: 616 mg/dL — ABNORMAL HIGH (ref 64–422)
IgG (Immunoglobin G), Serum: 1236 mg/dL (ref 586–1602)
IgM (Immunoglobulin M), Srm: 508 mg/dL — ABNORMAL HIGH (ref 26–217)
Total Protein ELP: 8 g/dL (ref 6.0–8.5)

## 2024-04-10 ENCOUNTER — Ambulatory Visit: Payer: Medicare PPO | Admitting: Hematology and Oncology

## 2024-04-11 ENCOUNTER — Inpatient Hospital Stay: Admitting: Physician Assistant

## 2024-04-11 VITALS — BP 135/60 | HR 71 | Temp 97.2°F | Resp 14 | Wt 183.9 lb

## 2024-04-11 DIAGNOSIS — D472 Monoclonal gammopathy: Secondary | ICD-10-CM | POA: Diagnosis not present

## 2024-04-11 DIAGNOSIS — D649 Anemia, unspecified: Secondary | ICD-10-CM | POA: Diagnosis not present

## 2024-04-11 DIAGNOSIS — Z79899 Other long term (current) drug therapy: Secondary | ICD-10-CM | POA: Diagnosis not present

## 2024-04-11 NOTE — Progress Notes (Signed)
 [facility] [phone]   [phone]  PROGRESS NOTE  Patient Care Team: Sylvan Evener, MD as PCP - General (Internal Medicine) Jann Melody, MD as PCP - Cardiology (Cardiology) Rudine Cos, MD as Consulting Physician (Ophthalmology)  Hematological/Oncological History # Monoclonal Gammopathy  04/11/2018: last visit with Dr. Perlov. Noted to have a polyclonal gammopathy 09/27/2023: SPEP reveals a restricted band (M-spike) migrating in the beta-2  globulin region  10/12/2023: establish care with Dr. Rosaline Coma   CHIEF COMPLAINTS/PURPOSE OF CONSULTATION:  MGUS  HISTORY OF PRESENTING ILLNESS:  Nancy Wagner 73 y.o. female returns for a follow up for MGUS. She was last seen by Dr. Rosaline Coma on 10/12/2023 to establish care.  In the interim, she denies any changes to her health.  She is accompanied by her husband for this visit.  On exam today, [name] reports that she has occasional fatigue but otherwise her energy levels are fairly stable.  She continues to complete her ADLs on her own.  She has a good appetite and denies any recent weight changes.  She denies nausea, vomiting or bowel habit changes.  She denies easy bruising or signs of active bleeding.  She continues to have bilateral leg pain which impacts her ambulation.  She has been evaluated by Ortho and rheumatology to suggest rheumatoid arthritis and osteoarthritis.  She denies any new bone or back pain.  She denies fevers, chills, night sweats, shortness of breath, chest pain or cough.  A full 10 point ROS was otherwise negative.  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   GERD (gastroesophageal reflux disease)    diet control   Gout    12-08-2019  per pt last episode left foot 11/ 2020   Hirsutism    History of obstructive sleep  apnea    s/p  UPPP in 1985   Hypertension    followed by pcp  (12-08-2019  per pt had stress test many yrs ago, was told ok)   Lichen simplex chronicus    OA (osteoarthritis)    RA (rheumatoid arthritis) (HCC)    12-08-2019  per pt was dx years ago, treated with high level prednisone  for few weeks and some other treatment with her eyes and has been in remission since   Type 2 diabetes mellitus (HCC)    followed by pcp ---  (12-08-2019 checks cbg's daily in am,  fasting-- 110 to 126)   Uterine fibroid    Wears glasses     SURGICAL HISTORY: Past Surgical History:  Procedure Laterality Date   CARPAL TUNNEL RELEASE Right    CESAREAN SECTION  1975, 1978   CHOLECYSTECTOMY OPEN  1984   COLONOSCOPY  last one 05-09-2018   DILATATION & CURRETTAGE/HYSTEROSCOPY WITH RESECTOCOPE N/A 04/07/2017   Procedure: DILATATION & CURETTAGE/HYSTEROSCOPY WITH RESECTOCOPE;  Surgeon: Stevenson Elbe, MD;  Location: [facility];  Service: Gynecology;  Laterality: N/A;   ROBOTIC ASSISTED SALPINGO OOPHERECTOMY Bilateral 12/14/2019   Procedure: XI ROBOTIC ASSISTED SALPINGO OOPHORECTOMY;  Surgeon: Ona Bidding, MD;  Location: [facility];  Service: Gynecology;  Laterality: Bilateral;  Bed held, but patient is expected to go home the same day. -ap   THROAT SURGERY  1985   minimal uvulopalatopharyngoplasty for sleep apnea   TUBAL LIGATION Bilateral 1981    SOCIAL HISTORY: Social History  Socioeconomic History   Marital status: Married    Spouse name: Not on file   Number of children: 2   Years of education: Not on file   Highest education level: Not on file  Occupational History   Not on file  Tobacco Use   Smoking status: Never   Smokeless tobacco: Never  Vaping Use   Vaping status: Never Used  Substance and Sexual Activity   Alcohol use: Yes    Comment: very rare   Drug use: Never   Sexual activity: Not on file  Other Topics Concern   Not on file  Social History Narrative   Not  on file   Social Drivers of Health   Financial Resource Strain: Not on file  Food Insecurity: No Food Insecurity (10/12/2023)   Hunger Vital Sign    Worried About Running Out of Food in the Last Year: Never true    Ran Out of Food in the Last Year: Never true  Transportation Needs: No Transportation Needs (10/12/2023)   PRAPARE - Administrator, Civil Service (Medical): No    Lack of Transportation (Non-Medical): No  Physical Activity: Not on file  Stress: Not on file  Social Connections: Not on file  Intimate Partner Violence: Not At Risk (10/12/2023)   Humiliation, Afraid, Rape, and Kick questionnaire    Fear of Current or Ex-Partner: No    Emotionally Abused: No    Physically Abused: No    Sexually Abused: No    FAMILY HISTORY: Family History  Problem Relation Age of Onset   Heart disease Sister     ALLERGIES:  is allergic to adhesive [tape], codeine, and other.  MEDICATIONS:  Current Outpatient Medications  Medication Sig Dispense Refill   albuterol  (VENTOLIN  HFA) 108 (90 Base) MCG/ACT inhaler TAKE 2 PUFFS BY MOUTH EVERY 6 HOURS AS NEEDED FOR WHEEZE OR SHORTNESS OF BREATH 8.5 each prn   allopurinol  (ZYLOPRIM ) 100 MG tablet TAKE 1 TABLET BY MOUTH EVERY DAY 90 tablet 1   amLODipine  (NORVASC ) 10 MG tablet TAKE 1 TABLET BY MOUTH ONCE DAILY. CALL OFFICE FOR FURTHER REFILLS 15 tablet 0   Blood Glucose Monitoring Suppl (ACCU-CHEK GUIDE ME) w/Device KIT USE AS DIRECTED 1 kit prn   cetirizine (ZYRTEC) 10 MG tablet Take 10 mg by mouth as needed.     Cholecalciferol (VITAMIN D3) 50 MCG (2000 UT) TABS Take 1 tablet by mouth daily.     cloNIDine  (CATAPRES ) 0.1 MG tablet TAKE 1 TABLET BY MOUTH TWICE A DAY 180 tablet 3   esomeprazole  (NEXIUM ) 40 MG capsule TAKE 1 CAPSULE BY MOUTH EVERY DAY 90 capsule 1   fish oil-omega-3 fatty acids 1000 MG capsule Take 2 g by mouth at bedtime. Reported on 06/18/2016 Taking 1 tablet     folic acid (FOLVITE) 400 MCG tablet Take 400 mcg by  mouth at bedtime.      glipiZIDE  (GLUCOTROL  XL) 10 MG 24 hr tablet TAKE 1 TABLET (10 MG TOTAL) BY MOUTH DAILY WITH BREAKFAST. 90 tablet 3   JANUMET  XR [phone] MG TB24 TAKE 1 TABLET BY MOUTH EVERY DAY WITH BREAKFAST 90 tablet 3   olmesartan -hydrochlorothiazide (BENICAR  HCT) 40-25 MG tablet TAKE 1 TABLET BY MOUTH EVERY DAY 90 tablet 1   simvastatin  (ZOCOR ) 40 MG tablet TAKE 1 TABLET BY MOUTH EVERYDAY AT BEDTIME 90 tablet 3   triamcinolone  cream (KENALOG ) 0.1 % Apply 1 Application topically 3 (three) times daily. 30 g 0   celecoxib  (CELEBREX ) 100 MG capsule  Take 1 capsule (100 mg total) by mouth 2 (two) times daily. (Patient not taking: Reported on 04/11/2024) 60 capsule 2   HYDROcodone -acetaminophen  (NORCO) 10-325 MG tablet Take 1 tablet by mouth every 6 (six) hours as needed. (Patient not taking: Reported on 04/11/2024) 30 tablet 0   No current facility-administered medications for this visit.    REVIEW OF SYSTEMS:   Constitutional: ( - ) fevers, ( - )  chills , ( - ) night sweats Eyes: ( - ) blurriness of vision, ( - ) double vision, ( - ) watery eyes Ears, nose, mouth, throat, and face: ( - ) mucositis, ( - ) sore throat Respiratory: ( - ) cough, ( - ) dyspnea, ( - ) wheezes Cardiovascular: ( - ) palpitation, ( - ) chest discomfort, ( - ) lower extremity swelling Gastrointestinal:  ( - ) nausea, ( - ) heartburn, ( - ) change in bowel habits Skin: ( - ) abnormal skin rashes Lymphatics: ( - ) new lymphadenopathy, ( - ) easy bruising Neurological: ( - ) numbness, ( - ) tingling, ( - ) new weaknesses Behavioral/Psych: ( - ) mood change, ( - ) new changes  All other systems were reviewed with the patient and are negative.  PHYSICAL EXAMINATION:  Vitals:   04/11/24 0912  BP: 135/60  Pulse: 71  Resp: 14  Temp: (!) 97.2 F (36.2 C)  SpO2: 100%   Filed Weights   04/11/24 0912  Weight: 183 lb 14.4 oz (83.4 kg)    GENERAL: well appearing elderly African-American female in NAD  SKIN:  skin color, texture, turgor are normal, no rashes or significant lesions EYES: conjunctiva are pink and non-injected, sclera clear LUNGS: clear to auscultation and percussion with normal breathing effort HEART: regular rate & rhythm and no murmurs and no lower extremity edema Musculoskeletal: no cyanosis of digits and no clubbing  PSYCH: alert & oriented x 3, fluent speech NEURO: no focal motor/sensory deficits  LABORATORY DATA:  I have reviewed the data as listed    Latest Ref Rng & Units 04/03/2024   10:12 AM 10/12/2023   10:51 AM 09/24/2023    4:09 PM  CBC  WBC 4.0 - 10.5 K/uL 6.7  6.6  11.2   Hemoglobin 12.0 - 15.0 g/dL 16.1  09.6  04.5   Hematocrit 36.0 - 46.0 % 34.5  34.1  34.4   Platelets 150 - 400 K/uL 253  182  321        Latest Ref Rng & Units 04/03/2024   10:12 AM 12/24/2023    9:24 AM 10/12/2023   10:51 AM  CMP  Glucose 70 - 99 mg/dL 409   811   BUN 8 - 23 mg/dL 19   15   Creatinine 9.14 - 1.00 mg/dL 7.82   9.56   Sodium 213 - 145 mmol/L 139   140   Potassium 3.5 - 5.1 mmol/L 4.2   3.9   Chloride 98 - 111 mmol/L 104   104   CO2 22 - 32 mmol/L 29   30   Calcium 8.9 - 10.3 mg/dL 08.6   9.6   Total Protein 6.5 - 8.1 g/dL 8.3  7.9  7.6   Total Bilirubin 0.0 - 1.2 mg/dL 0.3  0.4  0.3   Alkaline Phos 38 - 126 U/L 51   46   AST 15 - 41 U/L 21  21  15    ALT 0 - 44 U/L 19  16  20  ASSESSMENT & PLAN Caprice Ashmead 73 y.o. female who returns for a follow up for MGUS.   #Monoclonal Gammopathy of Undetermined Significance --Labs from 04/03/2024 were reviewed with the patient.  WBC 6.7, mild but stable anemia with a hemoglobin of 11.4, platelet 253, stable creatinine at 1.13, normal calcium levels.  SPEP did not detect a monoclonal protein.  Kappa light chain elevated at 5.4, lambda light chain elevated at 38.3, ratio 1.19. --Most recent bone met survey from 10/26/2023 showed no evidence of lytic lesions.  Will repeat annually with next bone met survey due in November  2025. --Most recent 24-hour UPEP from 10/25/2023 was unremarkable without any evidence of monoclonal protein.  Will repeat annually with next 24-hour UPEP due in November 2025. --No indication for bone marrow biopsy at this time.  --RTC in 6 months with repeat labs. Consider transitioning to once a year visit at that point.   #Bilateral lower extremity pain: ---Bone met survey from 10/26/2023 did show facet hypertrophy in the lower lumbar spine without any lytic lesions.  --Recommend to follow up with PCP and ortho  to determine if she is a candidate for steroid injections to help with pain relief.   No orders of the defined types were placed in this encounter.   All questions were answered. The patient knows to call the clinic with any problems, questions or concerns.  I have spent a total of 25 minutes minutes of face-to-face and non-face-to-face time, preparing to see the patient, obtaining and/or reviewing separately obtained history, performing a medically appropriate examination, counseling and educating the patient, ordering medications/tests/procedures, referring and communicating with other health care professionals, documenting clinical information in the electronic health record, independently interpreting results and communicating results to the patient, and care coordination.   Wyline Hearing PA-C Dept of Hematology and Oncology [facility] at [facility] Phone: [phone]   04/11/2024 11:21 AM

## 2024-04-17 ENCOUNTER — Ambulatory Visit: Payer: Medicare PPO

## 2024-04-17 ENCOUNTER — Ambulatory Visit: Payer: Self-pay | Admitting: Cardiology

## 2024-04-17 DIAGNOSIS — R55 Syncope and collapse: Secondary | ICD-10-CM | POA: Diagnosis not present

## 2024-04-17 LAB — CUP PACEART REMOTE DEVICE CHECK
Date Time Interrogation Session: 20250518232435
Implantable Pulse Generator Implant Date: 20230919

## 2024-04-26 ENCOUNTER — Other Ambulatory Visit: Payer: Self-pay | Admitting: Internal Medicine

## 2024-04-27 NOTE — Progress Notes (Signed)
 [facility] Summary Report / Loop Recorder

## 2024-05-01 ENCOUNTER — Encounter: Payer: Self-pay | Admitting: Internal Medicine

## 2024-05-01 ENCOUNTER — Ambulatory Visit: Payer: Self-pay

## 2024-05-01 ENCOUNTER — Other Ambulatory Visit: Payer: Self-pay | Admitting: Internal Medicine

## 2024-05-01 ENCOUNTER — Ambulatory Visit: Admitting: Internal Medicine

## 2024-05-01 VITALS — HR 62 | Ht 63.0 in | Wt 183.0 lb

## 2024-05-01 DIAGNOSIS — M5416 Radiculopathy, lumbar region: Secondary | ICD-10-CM

## 2024-05-01 MED ORDER — HYDROCODONE-ACETAMINOPHEN 10-325 MG PO TABS: ORAL_TABLET | ORAL | 0 refills | Status: DC

## 2024-05-01 MED ORDER — METHYLPREDNISOLONE 4 MG PO TABS: ORAL_TABLET | ORAL | 0 refills | Status: DC

## 2024-05-01 NOTE — Telephone Encounter (Signed)
  Chief Complaint: entire body is in pain Symptoms: "pain everywhere" Frequency: ongoing Disposition: [] ED /[] Urgent Care (no appt availability in office) / [x] Appointment(In office/virtual)/ []  [facility]/ [] Home Care/ [] Refused Recommended Disposition /[] [facility]/ []  Follow-up with PCP Additional Notes: Pt states that she is "in pain all over and I just want an appointment", pt states that it is in her ankles and legs and hurts to walk. Scheduled.  Copied from CRM [id number]. Topic: Clinical - Red Word Triage >> May 01, 2024  9:15 AM Crispin Dolphin wrote: Red Word that prompted transfer to Nurse Triage: Whole body hurts can barely walk - affecting mental state Reason for Disposition . [1] SEVERE pain (e.g., excruciating, unable to walk) AND [2] not improved after 2 hours of pain medicine  Answer Assessment - Initial Assessment Questions 2. LOCATION: "Where is the pain located?"      everywhere 3. PAIN: "How bad is the pain?"    (Scale 1-10; or mild, moderate, severe)  - MILD (1-3): doesn't interfere with normal activities.   - MODERATE (4-7): interferes with normal activities (e.g., work or school) or awakens from sleep, limping.   - SEVERE (8-10): excruciating pain, unable to do any normal activities, unable to walk.      Pt states that she is unable to walk 6. OTHER SYMPTOMS: "Do you have any other symptoms?" (e.g., calf pain, rash, fever, swelling)     Pain all over  Protocols used: Ankle Pain-A-AH

## 2024-05-01 NOTE — Progress Notes (Signed)
 Patient Care Team: Sylvan Evener, MD as PCP - General (Internal Medicine) Jann Melody, MD as PCP - Cardiology (Cardiology) Rudine Cos, MD as Consulting Physician (Ophthalmology)  Visit Date: 05/01/24  Subjective:   Chief Complaint  Patient presents with   right hip pain   Patient XL:KGMWNUU Matusik,Female DOB:[birth year],72 y.o. VOZ:[id number]   73 y.o.Female presents today for acute visit with Right-sided Pain. Patient has a past medical history of Rheumatoid Arthritis; MGUS. 04/03/2024 Multiple Myeloma Panel, compared to 10/2023: IGM  508;  IGA  616. Initially seen for this issue in January, was referred to [facility], who she first saw in February where she had imaging done (impression under Studies Obtained) and was given Hyaluronic Acid Injection three times starting in February and finishing in March. Describes pain as a burning pain that starts behind her right knee and will diffuse down to her ankle and up her thigh. Seen by her Rheumatologist, Dr. Meredith Stalls in March, though there is not a comprehensive note to review from that visit, but back in November 2024 it was mentioned that patient was still having a low-back pain and Dr. Meredith Stalls at the time said she believed her joint pain was related to osteoarthritis and her neck/low back pain were causing some pinched nerve symptoms. At that time Dr. Meredith Stalls did not see any swollen, tender, or erythematous joints and patient denied prolonged stiffness in joints in the morning.   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   GERD (gastroesophageal reflux disease)    diet control   Gout    12-08-2019  per pt last episode left foot 11/ 2020   Hirsutism    History of obstructive sleep apnea    s/p  UPPP in 1985   Hypertension    followed by pcp  (12-08-2019  per pt had stress test many yrs ago,  was told ok)   Lichen simplex chronicus    OA (osteoarthritis)    RA (rheumatoid arthritis) (HCC)    12-08-2019  per pt was dx years ago, treated with high level prednisone  for few weeks and some other treatment with her eyes and has been in remission since   Type 2 diabetes mellitus (HCC)    followed by pcp ---  (12-08-2019 checks cbg's daily in am,  fasting-- 110 to 126)   Uterine fibroid    Wears glasses     Allergies  Allergen Reactions   Adhesive [Tape] Rash    Band-Aid   Codeine Hives, Itching and Rash    Other Reaction(s): Not available   Other Rash    Family History  Problem Relation Age of Onset   Heart disease Sister    Social Hx: Retired Engineer, production at [employer] Married. Husband retired Firefighter. @ adult children, one son and one daughter. Does not smoke or consume alcohol  Review of Systems  Musculoskeletal:  Positive for joint pain (burning pain starts behind right knee diffuse down to ankle, up to back).     Objective:  Vitals: Pulse 62   Ht 5\' 3"  (1.6 m)   Wt 183 lb (83 kg)   SpO2 98%   BMI 32.42 kg/m   Physical Exam Vitals and nursing note reviewed.  Constitutional:      General: She is not in acute distress.    Appearance: Normal appearance.  She is not toxic-appearing.  HENT:     Head: Normocephalic and atraumatic.  Pulmonary:     Effort: Pulmonary effort is normal.  Musculoskeletal:     Lumbar back: Negative left straight leg raise test.     Comments: Expresses pain when standing/sitting and walking  Skin:    General: Skin is warm and dry.  Neurological:     Mental Status: She is alert and oriented to person, place, and time. Mental status is at baseline.     Deep Tendon Reflexes: Reflexes are normal and symmetric.  Psychiatric:        Mood and Affect: Mood normal.        Behavior: Behavior normal.        Thought Content: Thought content normal.        Judgment: Judgment normal.     Results:  Studies Obtained And Personally  Reviewed By Me:  01/06/2024 Radiograph Impressions:  Lumbar Spine 2 View No acute fracture, dislocation, or space-occupying lesion  Degenerative changes in lumbar spine  R > L facet athropathy in lower lumbar segments  No acute fracture  Hint of a possible mild 1 anterolisthesis L4-5  Spondylosis worse at L5-S1   Right Knee 3 View Tricompartmental osteoarthrosis, worse in medial compartment  Diffuse chondrocalcinosis medially and laterally  Labs:     Component Value Date/Time   NA 139 04/03/2024 1012   K 4.2 04/03/2024 1012   CL 104 04/03/2024 1012   CO2 29 04/03/2024 1012   GLUCOSE 134 (H) 04/03/2024 1012   BUN 19 04/03/2024 1012   CREATININE 1.13 (H) 04/03/2024 1012   CREATININE 1.21 (H) 09/27/2023 1151   CALCIUM 10.0 04/03/2024 1012   PROT 8.3 (H) 04/03/2024 1012   ALBUMIN 4.3 04/03/2024 1012   AST 21 04/03/2024 1012   ALT 19 04/03/2024 1012   ALKPHOS 51 04/03/2024 1012   BILITOT 0.3 04/03/2024 1012   GFRNONAA 51 (L) 04/03/2024 1012   GFRNONAA 50 (L) 05/29/2021 0918   GFRAA 58 (L) 05/29/2021 0918    Lab Results  Component Value Date   WBC 6.7 04/03/2024   HGB 11.4 (L) 04/03/2024   HCT 34.5 (L) 04/03/2024   MCV 90.8 04/03/2024   PLT 253 04/03/2024   Lab Results  Component Value Date   CHOL 151 12/24/2023   HDL 69 12/24/2023   LDLCALC 63 12/24/2023   TRIG 104 12/24/2023   CHOLHDL 2.2 12/24/2023   Lab Results  Component Value Date   HGBA1C 7.8 (H) 12/24/2023    Lab Results  Component Value Date   TSH 0.72 09/24/2023   Assessment & Plan:   Orders Placed This Encounter  Procedures   Ambulatory referral to Neurology   Ambulatory referral to Physical Therapy   Meds ordered this encounter  Medications   methylPREDNISolone  (MEDROL ) 4 MG tablet    Sig: Take 6 tabs by mouth on day 1 and decrease by one tab every 2 days  ie 6-6-5-5-4-4-3-3-2-2-1-1 taper    Dispense:  42 tablet    Refill:  0   HYDROcodone -acetaminophen  (NORCO) 10-325 MG tablet     Sig: One half to one tab by mouth every 6 hours sparingly as needed for pain    Dispense:  15 tablet    Refill:  0   Lumbar Radiculopathy: has a past medical history of Rheumatoid Arthritis; MGUS. Initially seen for this issue in January, was referred to [facility], who she first saw them in February where she had lumbar and right  knee imaging done and Hyaluronic Acid Injection 3x starting in February and finishing in March. She describes her pain as a burning pain that starts behind her right knee and will diffuse down to her ankle and up her thigh. According to Rheumatology note from 10/26/2023, she had told Dr. Meredith Stalls that "she continued to have low-back pain and sciatic nerve pain w/ difficulty standing for prolonged times without having to sit down". In that note, Dr. Meredith Stalls said at the time she believed that patient's joint pain was related to osteoarthritis and her neck/low back pain were causing pinched nerve symptoms, especially in her low back. Sending in Medrol  tapering 6-6-5-5-4-4-3-3-2-2-1-1 and Norco 10-325 mg #15 to take half to whole tablet every 6 hours sparingly as needed. Referrals placed for Neurology and Physical Therapy.      I,Emily Lagle,acting as a Neurosurgeon for Sylvan Evener, MD.,have documented all relevant documentation on the behalf of Sylvan Evener, MD,as directed by  Sylvan Evener, MD while in the presence of Sylvan Evener, MD.   I, Sylvan Evener, MD, have reviewed all documentation for this visit. The documentation on 05/01/24 for the exam, diagnosis, procedures, and orders are all accurate and complete.

## 2024-05-01 NOTE — Patient Instructions (Addendum)
 I would like for her to see Neurology regarding possible lumbar radiculopathy as soon as possible. Also, ordered PT at [facility] on [address] Given 12 day Medrol  dose pack and 5 day Prescription for Norco 10/325 to take sparingly for pain

## 2024-05-02 ENCOUNTER — Encounter: Payer: Self-pay | Admitting: Neurology

## 2024-05-17 ENCOUNTER — Telehealth: Payer: Self-pay | Admitting: Internal Medicine

## 2024-05-17 NOTE — Telephone Encounter (Signed)
 I likely will not have much more to offer than what Dr. Paulita Boss is already doing, but happy to see. Based on previous notes, she will likely be prn follow up with Cardiology in the future.  Thanks MJP

## 2024-05-17 NOTE — Telephone Encounter (Signed)
 Pt requesting provider switch from Dr Domingo Friend to Dr Filiberto Hug. Please advise

## 2024-05-18 ENCOUNTER — Ambulatory Visit (INDEPENDENT_AMBULATORY_CARE_PROVIDER_SITE_OTHER)

## 2024-05-18 ENCOUNTER — Ambulatory Visit: Payer: Self-pay | Admitting: Cardiology

## 2024-05-18 DIAGNOSIS — R55 Syncope and collapse: Secondary | ICD-10-CM | POA: Diagnosis not present

## 2024-05-18 LAB — CUP PACEART REMOTE DEVICE CHECK
Date Time Interrogation Session: 20250618232419
Implantable Pulse Generator Implant Date: 20230919

## 2024-05-22 ENCOUNTER — Encounter: Payer: Self-pay | Admitting: Neurology

## 2024-05-22 ENCOUNTER — Ambulatory Visit: Admitting: Neurology

## 2024-05-22 VITALS — BP 126/74 | HR 76 | Ht 63.0 in | Wt 185.0 lb

## 2024-05-22 DIAGNOSIS — M5417 Radiculopathy, lumbosacral region: Secondary | ICD-10-CM

## 2024-05-22 NOTE — Progress Notes (Signed)
 [facility] Clinic Note - Initial Visit   Date: 05/22/2024   Nancy Wagner MRN: [id number] DOB: 1951-08-23   Dear Dr. Perri:  Thank you for your kind referral of Nancy Wagner for consultation of bilateral leg pain. Although her history is well known to you, please allow us  to reiterate it for the purpose of our medical record. The patient was accompanied to the clinic by self.    Nancy Wagner is a 73 y.o. right-handed female with RA, MGUS, GERD, and type 2 diabetes mellitus presenting for evaluation of bilateral leg pain.   IMPRESSION/PLAN: Bilateral leg pain, possibly due to lumbosacral radiculopathy.  She has completed PT with no improvement.  Pain has improved with medrol  dosepack.    - MRI lumbar spine wo contrast  - If pain returns, can start gabapentin  300mg  at bedtime  - Consider NCS/EMG of the legs, going forward  Further recommendations pending results. ------------------------------------------------------------- History of present illness: Starting around late 2024, she began having bilateral leg pain down the back of her legs, which can be burning at times.  Pain was present at rest and worse with activity. She had daily pain which interfered with walking and daily activity.  She tried PT with no improvement.  Her PCP started medrol  dosepak which has significantly reduced her pain.  She denies numbness/tingling.   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    GERD (gastroesophageal reflux disease)    diet control   Gout    12-08-2019  per pt last episode left foot 11/ 2020   Hirsutism    History of obstructive sleep apnea    s/p  UPPP in 1985   Hypertension    followed by pcp  (12-08-2019  per pt had stress test many yrs ago, was told ok)   Lichen simplex chronicus    OA (osteoarthritis)    RA (rheumatoid arthritis) (HCC)    12-08-2019  per pt was dx years ago, treated with high level prednisone  for few weeks and some other treatment with her eyes and has been in remission since   Type 2 diabetes mellitus (HCC)    followed by pcp ---  (12-08-2019 checks cbg's daily in am,  fasting-- 110 to 126)   Uterine fibroid    Wears glasses     Past Surgical History:  Procedure Laterality Date   CARPAL TUNNEL RELEASE Right    CESAREAN SECTION  1975, 1978   CHOLECYSTECTOMY OPEN  1984   COLONOSCOPY  last one 05-09-2018   DILATATION & CURRETTAGE/HYSTEROSCOPY WITH RESECTOCOPE N/A 04/07/2017   Procedure: DILATATION & CURETTAGE/HYSTEROSCOPY WITH RESECTOCOPE;  Surgeon: Raeanne Shanda SQUIBB, MD;  Location: [facility];  Service: Gynecology;  Laterality: N/A;   ROBOTIC ASSISTED SALPINGO OOPHERECTOMY Bilateral 12/14/2019   Procedure: XI ROBOTIC ASSISTED SALPINGO OOPHORECTOMY;  Surgeon: Darcel Pool, MD;  Location: [facility];  Service: Gynecology;  Laterality: Bilateral;  Bed held, but patient is expected to go home the  same day. -ap   THROAT SURGERY  1985   minimal uvulopalatopharyngoplasty for sleep apnea   TUBAL LIGATION Bilateral 1981     Medications:  Outpatient Encounter Medications as of 05/22/2024  Medication Sig   albuterol  (VENTOLIN  HFA) 108 (90 Base) MCG/ACT inhaler TAKE 2 PUFFS BY MOUTH EVERY 6 HOURS AS NEEDED FOR WHEEZE OR SHORTNESS OF BREATH   allopurinol  (ZYLOPRIM ) 100 MG tablet TAKE 1 TABLET BY MOUTH EVERY DAY   amLODipine  (NORVASC ) 10 MG tablet TAKE 1 TABLET BY MOUTH ONCE DAILY. CALL OFFICE FOR FURTHER REFILLS   Blood Glucose  Monitoring Suppl (ACCU-CHEK GUIDE ME) w/Device KIT USE AS DIRECTED   cetirizine (ZYRTEC) 10 MG tablet Take 10 mg by mouth as needed.   Cholecalciferol (VITAMIN D3) 50 MCG (2000 UT) TABS Take 1 tablet by mouth daily.   cloNIDine  (CATAPRES ) 0.1 MG tablet TAKE 1 TABLET BY MOUTH TWICE A DAY   esomeprazole  (NEXIUM ) 40 MG capsule TAKE 1 CAPSULE BY MOUTH EVERY DAY   fish oil-omega-3 fatty acids 1000 MG capsule Take 2 g by mouth at bedtime. Reported on 06/18/2016 Taking 1 tablet   folic acid (FOLVITE) 400 MCG tablet Take 400 mcg by mouth at bedtime.    glipiZIDE  (GLUCOTROL  XL) 10 MG 24 hr tablet TAKE 1 TABLET (10 MG TOTAL) BY MOUTH DAILY WITH BREAKFAST.   HYDROcodone -acetaminophen  (NORCO) 10-325 MG tablet One half to one tab by mouth every 6 hours sparingly as needed for pain   JANUMET  XR [phone] MG TB24 TAKE 1 TABLET BY MOUTH EVERY DAY WITH BREAKFAST   methylPREDNISolone  (MEDROL ) 4 MG tablet Take 6 tabs by mouth on day 1 and decrease by one tab every 2 days  ie 6-6-5-5-4-4-3-3-2-2-1-1 taper   olmesartan -hydrochlorothiazide (BENICAR  HCT) 40-25 MG tablet TAKE 1 TABLET BY MOUTH EVERY DAY   simvastatin  (ZOCOR ) 40 MG tablet TAKE 1 TABLET BY MOUTH EVERYDAY AT BEDTIME   triamcinolone  cream (KENALOG ) 0.1 % Apply 1 Application topically 3 (three) times daily.   No facility-administered encounter medications on file as of 05/22/2024.    Allergies:  Allergies  Allergen Reactions   Adhesive [Tape] Rash    Band-Aid   Codeine Hives, Itching and Rash    Other Reaction(s): Not available   Other Rash    Family History: Family History  Problem Relation Age of Onset   Heart disease Sister     Social History: Social History   Tobacco Use   Smoking status: Never   Smokeless tobacco: Never  Vaping Use   Vaping status: Never Used  Substance Use Topics   Alcohol use: Yes    Comment: very rare   Drug use: Never   Social History   Social History Narrative   Right handed     Vital Signs:  BP  126/74   Pulse 76   Ht 5' 3 (1.6 m)   Wt 185 lb (83.9 kg)   SpO2 97%   BMI 32.77 kg/m    Neurological Exam: MENTAL STATUS including orientation to time, place, person, recent and remote memory, attention span and concentration, language, and fund of knowledge is normal.  Speech is not dysarthric.  CRANIAL NERVES: II:  No visual field defects.     III-IV-VI: Pupils equal round and reactive to light.  Normal conjugate, extra-ocular eye movements in all directions of gaze.  No nystagmus.  No ptosis.   V:  Normal facial sensation.    VII:  Normal facial symmetry and movements.   VIII:  Normal hearing and  vestibular function.   IX-X:  Normal palatal movement.   XI:  Normal shoulder shrug and head rotation.   XII:  Normal tongue strength and range of motion, no deviation or fasciculation.  MOTOR:  No atrophy, fasciculations or abnormal movements.  No pronator drift.   Upper Extremity:  Right  Left  Deltoid  5/5   5/5   Biceps  5/5   5/5   Triceps  5/5   5/5   Wrist extensors  5/5   5/5   Wrist flexors  5/5   5/5   Finger extensors  5/5   5/5   Finger flexors  5/5   5/5   Dorsal interossei  5/5   5/5   Abductor pollicis  5/5   5/5   Tone (Ashworth scale)  0  0   Lower Extremity:  Right  Left  Hip flexors  5/5   5/5   Hip extensors  5/5   5/5   Knee flexors  5/5   5/5   Knee extensors  5/5   5/5   Dorsiflexors  5/5   5/5   Plantarflexors  5/5   5/5   Toe extensors  5/5   5/5   Toe flexors  5/5   5/5   Tone (Ashworth scale)  0  0   MSRs:                                           Right        Left brachioradialis 2+  2+  biceps 2+  2+  triceps 2+  2+  patellar 3+  3+  ankle jerk 2+  2+  Hoffman no  no  plantar response down  down   SENSORY:  Normal and symmetric perception of light touch, pinprick, vibration, and temperature.   COORDINATION/GAIT: Normal finger-to- nose-finger.  Intact rapid alternating movements bilaterally.  Gait narrow based and stable.    Thank  you for allowing me to participate in patient's care.  If I can answer any additional questions, I would be pleased to do so.    Sincerely,    Danyele Smejkal K. Tobie, DO

## 2024-05-22 NOTE — Patient Instructions (Signed)
MRI lumbar spine without contrast    

## 2024-05-23 ENCOUNTER — Other Ambulatory Visit: Payer: Self-pay | Admitting: Internal Medicine

## 2024-05-23 ENCOUNTER — Other Ambulatory Visit: Payer: Self-pay

## 2024-05-23 DIAGNOSIS — E119 Type 2 diabetes mellitus without complications: Secondary | ICD-10-CM

## 2024-05-23 MED ORDER — GLIPIZIDE ER 10 MG PO TB24: 10.0000 mg | ORAL_TABLET | Freq: Every day | ORAL | 3 refills | Status: AC

## 2024-05-23 NOTE — Telephone Encounter (Unsigned)
 Copied from CRM [id number]. Topic: Clinical - Medication Refill >> May 23, 2024  4:04 PM Delon DASEN wrote: Medication: allopurinol  (ZYLOPRIM ) 100 MG tablet   Has the patient contacted their pharmacy? Yes (Agent: If no, request that the patient contact the pharmacy for the refill. If patient does not wish to contact the pharmacy document the reason why and proceed with request.) (Agent: If yes, when and what did the pharmacy advise?)  This is the patient's preferred pharmacy:  [id number] [address] [place], [place] - [address] [place] [address] Phone: [phone] Fax: [phone]  Is this the correct pharmacy for this prescription? Yes If no, delete pharmacy and type the correct one.   Has the prescription been filled recently? Yes  Is the patient out of the medication? Yes  Has the patient been seen for an appointment in the last year OR does the patient have an upcoming appointment? Yes  Can we respond through MyChart? No  Agent: Please be advised that Rx refills may take up to 3 business days. We ask that you follow-up with your pharmacy.

## 2024-05-24 ENCOUNTER — Telehealth: Payer: Self-pay | Admitting: Internal Medicine

## 2024-05-31 DIAGNOSIS — E119 Type 2 diabetes mellitus without complications: Secondary | ICD-10-CM | POA: Diagnosis not present

## 2024-05-31 DIAGNOSIS — H524 Presbyopia: Secondary | ICD-10-CM | POA: Diagnosis not present

## 2024-05-31 DIAGNOSIS — H2513 Age-related nuclear cataract, bilateral: Secondary | ICD-10-CM | POA: Diagnosis not present

## 2024-05-31 DIAGNOSIS — H52203 Unspecified astigmatism, bilateral: Secondary | ICD-10-CM | POA: Diagnosis not present

## 2024-05-31 DIAGNOSIS — H43813 Vitreous degeneration, bilateral: Secondary | ICD-10-CM | POA: Diagnosis not present

## 2024-05-31 DIAGNOSIS — H25013 Cortical age-related cataract, bilateral: Secondary | ICD-10-CM | POA: Diagnosis not present

## 2024-05-31 LAB — HM DIABETES EYE EXAM

## 2024-05-31 NOTE — Progress Notes (Signed)
 [facility] Summary Report / Loop Recorder

## 2024-06-07 ENCOUNTER — Encounter: Payer: Self-pay | Admitting: Internal Medicine

## 2024-06-19 ENCOUNTER — Ambulatory Visit

## 2024-06-19 DIAGNOSIS — R55 Syncope and collapse: Secondary | ICD-10-CM

## 2024-06-20 ENCOUNTER — Other Ambulatory Visit: Payer: Medicare PPO

## 2024-06-20 DIAGNOSIS — M1712 Unilateral primary osteoarthritis, left knee: Secondary | ICD-10-CM | POA: Diagnosis not present

## 2024-06-20 DIAGNOSIS — E1169 Type 2 diabetes mellitus with other specified complication: Secondary | ICD-10-CM | POA: Diagnosis not present

## 2024-06-20 DIAGNOSIS — E785 Hyperlipidemia, unspecified: Secondary | ICD-10-CM | POA: Diagnosis not present

## 2024-06-20 DIAGNOSIS — I10 Essential (primary) hypertension: Secondary | ICD-10-CM

## 2024-06-20 DIAGNOSIS — E119 Type 2 diabetes mellitus without complications: Secondary | ICD-10-CM | POA: Diagnosis not present

## 2024-06-20 DIAGNOSIS — Z0000 Encounter for general adult medical examination without abnormal findings: Secondary | ICD-10-CM | POA: Diagnosis not present

## 2024-06-20 LAB — CUP PACEART REMOTE DEVICE CHECK
Date Time Interrogation Session: 20250720232955
Implantable Pulse Generator Implant Date: 20230919

## 2024-06-21 ENCOUNTER — Ambulatory Visit: Payer: Self-pay | Admitting: Internal Medicine

## 2024-06-21 LAB — CBC WITH DIFFERENTIAL/PLATELET
Absolute Lymphocytes: 2826 {cells}/uL (ref 850–3900)
Absolute Monocytes: 575 {cells}/uL (ref 200–950)
Basophils Absolute: 43 {cells}/uL (ref 0–200)
Basophils Relative: 0.6 %
Eosinophils Absolute: 178 {cells}/uL (ref 15–500)
Eosinophils Relative: 2.5 %
HCT: 36.9 % (ref 35.0–45.0)
Hemoglobin: 11.4 g/dL — ABNORMAL LOW (ref 11.7–15.5)
MCH: 29.5 pg (ref 27.0–33.0)
MCHC: 30.9 g/dL — ABNORMAL LOW (ref 32.0–36.0)
MCV: 95.6 fL (ref 80.0–100.0)
MPV: 11.8 fL (ref 7.5–12.5)
Monocytes Relative: 8.1 %
Neutro Abs: 3479 {cells}/uL (ref 1500–7800)
Neutrophils Relative %: 49 %
Platelets: 235 Thousand/uL (ref 140–400)
RBC: 3.86 Million/uL (ref 3.80–5.10)
RDW: 13.1 % (ref 11.0–15.0)
Total Lymphocyte: 39.8 %
WBC: 7.1 Thousand/uL (ref 3.8–10.8)

## 2024-06-21 LAB — LIPID PANEL
Cholesterol: 165 mg/dL (ref <200)
HDL: 63 mg/dL (ref >=50)
LDL Cholesterol (Calc): 78 mg/dL
Non-HDL Cholesterol (Calc): 102 mg/dL (ref <130)
Total CHOL/HDL Ratio: 2.6 (calc) (ref <5.0)
Triglycerides: 140 mg/dL (ref <150)

## 2024-06-21 LAB — COMPLETE METABOLIC PANEL WITHOUT GFR
AG Ratio: 1.3 (calc) (ref 1.0–2.5)
ALT: 16 U/L (ref 6–29)
AST: 18 U/L (ref 10–35)
Albumin: 4.3 g/dL (ref 3.6–5.1)
Alkaline phosphatase (APISO): 45 U/L (ref 37–153)
BUN/Creatinine Ratio: 13 (calc) (ref 6–22)
BUN: 14 mg/dL (ref 7–25)
CO2: 29 mmol/L (ref 20–32)
Calcium: 9.9 mg/dL (ref 8.6–10.4)
Chloride: 103 mmol/L (ref 98–110)
Creat: 1.06 mg/dL — ABNORMAL HIGH (ref 0.60–1.00)
Globulin: 3.3 g/dL (ref 1.9–3.7)
Glucose, Bld: 127 mg/dL — ABNORMAL HIGH (ref 65–99)
Potassium: 4.4 mmol/L (ref 3.5–5.3)
Sodium: 142 mmol/L (ref 135–146)
Total Bilirubin: 0.3 mg/dL (ref 0.2–1.2)
Total Protein: 7.6 g/dL (ref 6.1–8.1)

## 2024-06-21 LAB — MICROALBUMIN / CREATININE URINE RATIO
Creatinine, Urine: 120 mg/dL (ref 20–275)
Microalb Creat Ratio: 21 mg/g{creat} (ref <30)
Microalb, Ur: 2.5 mg/dL

## 2024-06-21 LAB — HEMOGLOBIN A1C
Hgb A1c MFr Bld: 7.3 % — ABNORMAL HIGH (ref <5.7)
Mean Plasma Glucose: 163 mg/dL
eAG (mmol/L): 9 mmol/L

## 2024-06-21 LAB — TSH: TSH: 1.21 m[IU]/L (ref 0.40–4.50)

## 2024-06-23 NOTE — Progress Notes (Signed)
 Annual Wellness Visit   Patient Care Team: Anntonette Madewell, Ronal PARAS, MD as PCP - General (Internal Medicine) Santo Stanly LABOR, MD as PCP - Cardiology (Cardiology) [name], MD as Consulting Physician (Ophthalmology)  Visit Date: 06/26/24   Chief Complaint  Patient presents with   Annual Exam   Medicare Wellness   Subjective:  Patient: Nancy Wagner, Female DOB: 1951/01/15, 73 y.o. MRN: [id number] Nancy Wagner is a 73 y.o. Female who presents today for her Annual Wellness Visit. Patient has Type 2 Diabetes Mellitus w/ Obesity w/Coincident Hypertension; Metabolic Syndrome; History of Vitamin-D Deficiency; Mixed Hyperlipidemia; Osteoarthritis, Left Knee; Hyperlipidemia associated w/ Type 2 Diabetes Mellitus; Controlled Diabetes Mellitus Type II w/o Complication; PMB (Postmenopausal Bleeding); Abnormal Transvaginal Ultrasound; Dysproteinemia; Elevated Testosterone  Level; Hyperandrogenism; Pancreatic Lesion;; Mobitz Type 2 Second Degree Heart Block; PVC (Premature Ventricular Contraction); and Aortic Atherosclerosis.  History of Hypertension treated with Amlodipine  10 mg daily, Clonidine  twice daily, and Olmesartan -HCTZ 40-25 mg daily. Blood Pressure: normotensive today at 120/80.   History of Hyperlipidemia treated with Simvastatin  40 mg daily. 06/20/2024 Lipid Panel: WNL.    History of Diabetes Mellitus, type II treated with Janumet -XR [phone] mg daily and Glipizide  10 mg daily.  06/20/2024 HgbA1c 7.3, decreased from 7.8 in January, still elevated from 6.9 in 05/2023; Glucose 127, decreased from 134 in May, still elevated from 115; Microalbumin: WNL at 2.5. Since last year, she has gained 4 lbs, and today weighs 185 lbsBMI 32.77. Eye exam 06/07/2024 by Dr. Dominique Patrcia at [id number] did not detect diabetic retinopathy.  History of GERD treated with Nexium  40 mg daily.  History of Gout, which was managed with Allopurinol  100 mg daily, however she says that she had not  been taking this due to her pharmacy no longer carrying this, and recently ate something that aggravated her gout, which she managed with benadryl and some topical cream.   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  History of Vitamin-D Deficiency treated with Vitamin-D 2000 units daily. 2019 Vitamin-D: 42, has not been repeated due to expense.  History of Osteoarthritis; Rheumatoid Arthritis treated with Norco 10-325 mg 0.5-1 full tablet every 6 hours sparingly as needed for pain, Medrol  tapering course.  Labs 06/20/2024 CBC, compared to 05/2023: Hemoglobin 11.4, elevated from 11.3; MCHC 30.9, decreased from 32.4; otherwise WNL C-MET, compared to 05/2023: Glucose 127; Creatinine 1.06, decreased from 1.25; otherwise WNL.   TSH: 1.21   Dr. Darcel is her gynecologist; History of Hyperandrogenism s/p BSO in 2021; PAP Smear 11/17/2017 according to continuity of care documents.  Mammogram 11/05/2023 normal with repeat recommendation of 2025.  Colonoscopy 05/09/2018 found Small External & Internal Hemorrhoids; otherwise exam normal with repeat recommendation of 2029.  Bone Density 04/27/2012 was normal.    Vaccine Counseling: Due for Shingrix 1st dose; UTD on PNA and Tdap.  Review of Systems  Constitutional:  Negative for chills, fever, malaise/fatigue and weight loss.  HENT:  Negative for hearing loss, sinus pain and sore throat.   Respiratory:  Negative for cough, hemoptysis and shortness of  breath.  Cardiovascular:  Negative for chest pain, palpitations, leg swelling and PND.  Gastrointestinal:  Negative for abdominal pain, constipation, diarrhea, heartburn, nausea and vomiting.  Genitourinary:  Negative for dysuria, frequency and urgency.  Musculoskeletal:  Positive for joint pain (recent gout flare-up). Negative for back pain, myalgias and neck pain.  Skin:  Negative for itching and rash.  Neurological:  Negative for dizziness, tingling, seizures and headaches.  Endo/Heme/Allergies:  Negative for polydipsia.  Psychiatric/Behavioral:  Negative for depression. The patient is not nervous/anxious.     Objective:  Vitals: body mass index is 32.77 kg/m. Today's Vitals   06/26/24 1101 06/26/24 1111  BP: 120/80   Pulse: 78   SpO2: 98%   Weight: 185 lb (83.9 kg)   Height: 5' 3 (1.6 m)   PainSc: 0-No pain 0-No pain   Physical Exam Vitals and nursing note reviewed.  Constitutional:      General: She is not in acute distress.    Appearance: Normal appearance. She is not ill-appearing or toxic-appearing.  HENT:     Head: Normocephalic and atraumatic.     Right Ear: Hearing, tympanic membrane, ear canal and external ear normal.     Left Ear: Hearing, tympanic membrane, ear canal and external ear normal.     Mouth/Throat:     Pharynx: Oropharynx is clear.  Eyes:     Extraocular Movements: Extraocular movements intact.     Pupils: Pupils are equal, round, and reactive to light.  Neck:     Thyroid : No thyroid  mass, thyromegaly or thyroid  tenderness.     Vascular: No carotid bruit.  Cardiovascular:     Rate and Rhythm: Normal rate and regular rhythm. No extrasystoles are present.    Pulses:          Dorsalis pedis pulses are 2+ on the right side and 2+ on the left side.     Heart sounds: Normal heart sounds. No murmur heard.    No friction rub. No gallop.  Pulmonary:     Effort: Pulmonary effort is normal.     Breath sounds: Normal breath sounds. No decreased breath  sounds, wheezing, rhonchi or rales.  Chest:     Chest wall: No mass.  Abdominal:     Palpations: Abdomen is soft. There is no hepatomegaly, splenomegaly or mass.     Tenderness: There is no abdominal tenderness.     Hernia: No hernia is present.  Genitourinary:    Comments: S/p BSO Musculoskeletal:     Cervical back: Normal range of motion.     Right lower leg: No edema.     Left lower leg: No edema.  Lymphadenopathy:     Cervical: No cervical adenopathy.     Upper Body:     Right upper body: No supraclavicular adenopathy.     Left upper body: No supraclavicular adenopathy.  Skin:    General: Skin is warm and dry.  Neurological:     General: No focal deficit present.     Mental Status: She is alert and oriented to person, place, and time. Mental status is at baseline.     Sensory: Sensation is intact.     Motor: Motor function is intact. No weakness.     Deep Tendon Reflexes: Reflexes are normal and symmetric.  Psychiatric:        Attention and Perception: Attention normal.        Mood and Affect: Mood normal.        Speech: Speech normal.  Behavior: Behavior normal.        Thought Content: Thought content normal.        Cognition and Memory: Cognition normal.        Judgment: Judgment normal.    Current Outpatient Medications  Medication Instructions   albuterol  (VENTOLIN  HFA) 108 (90 Base) MCG/ACT inhaler TAKE 2 PUFFS BY MOUTH EVERY 6 HOURS AS NEEDED FOR WHEEZE OR SHORTNESS OF BREATH   allopurinol  (ZYLOPRIM ) 100 mg, Oral, Daily   amLODipine  (NORVASC ) 10 MG tablet TAKE 1 TABLET BY MOUTH ONCE DAILY. CALL OFFICE FOR FURTHER REFILLS   Blood Glucose Monitoring Suppl (ACCU-CHEK GUIDE ME) w/Device KIT USE AS DIRECTED   cetirizine (ZYRTEC) 10 mg, As needed   Cholecalciferol (VITAMIN D3) 50 MCG (2000 UT) TABS 1 tablet, Daily   cloNIDine  (CATAPRES ) 0.1 mg, Oral, 2 times daily   esomeprazole  (NEXIUM ) 40 MG capsule TAKE 1 CAPSULE BY MOUTH EVERY DAY   fish oil-omega-3 fatty  acids 2 g, Daily at bedtime   folic acid (FOLVITE) 400 mcg, Daily at bedtime   glipiZIDE  (GLUCOTROL  XL) 10 mg, Oral, Daily with breakfast   glipiZIDE  (GLUCOTROL  XL) 10 mg, Oral, Daily with breakfast   HYDROcodone -acetaminophen  (NORCO) 10-325 MG tablet One half to one tab by mouth every 6 hours sparingly as needed for pain   JANUMET  XR [phone] MG TB24 TAKE 1 TABLET BY MOUTH EVERY DAY WITH BREAKFAST   methylPREDNISolone  (MEDROL ) 4 MG tablet Take 6 tabs by mouth on day 1 and decrease by one tab every 2 days  ie 6-6-5-5-4-4-3-3-2-2-1-1 taper   olmesartan -hydrochlorothiazide (BENICAR  HCT) 40-25 MG tablet 1 tablet, Oral, Daily   simvastatin  (ZOCOR ) 40 MG tablet TAKE 1 TABLET BY MOUTH EVERYDAY AT BEDTIME   triamcinolone  cream (KENALOG ) 0.1 % 1 Application, Topical, 3 times daily   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   GERD (gastroesophageal reflux disease)    diet control   Gout    12-08-2019  per pt last episode left foot 11/ 2020   Hirsutism    History of obstructive sleep apnea    s/p  UPPP in 1985   Hypertension    followed by pcp  (12-08-2019  per pt had stress test many yrs ago, was told ok)   Lichen simplex chronicus    OA (osteoarthritis)    RA (rheumatoid arthritis) (HCC)    12-08-2019  per pt was dx years ago, treated with high level prednisone  for few weeks and some other treatment with her eyes and has been in remission since   Type 2 diabetes mellitus (HCC)    followed by pcp ---  (12-08-2019 checks cbg's daily in am,  fasting-- 110 to 126)   Uterine fibroid    Wears glasses    Medical/Surgical History Narrative:  Allergic/Intolerant to:  Allergies  Allergen Reactions   Adhesive [Tape] Rash    Band-Aid   Codeine Hives, Itching and Rash    Other Reaction(s): Not available   Other Rash   Past Surgical History:  Procedure Laterality  Date   CARPAL TUNNEL RELEASE Right    CESAREAN SECTION  1975, 1978   CHOLECYSTECTOMY OPEN  1984   COLONOSCOPY  last one 05-09-2018   DILATATION & CURRETTAGE/HYSTEROSCOPY WITH RESECTOCOPE N/A 04/07/2017   Procedure: DILATATION & CURETTAGE/HYSTEROSCOPY WITH RESECTOCOPE;  Surgeon: Raeanne Shanda SQUIBB, MD;  Location:  [facility];  Service: Gynecology;  Laterality: N/A;   ROBOTIC ASSISTED SALPINGO OOPHERECTOMY Bilateral 12/14/2019   Procedure: XI ROBOTIC ASSISTED SALPINGO OOPHORECTOMY;  Surgeon: Darcel Pool, MD;  Location: [facility];  Service: Gynecology;  Laterality: Bilateral;  Bed held, but patient is expected to go home the same day. -ap   THROAT SURGERY  1985   minimal uvulopalatopharyngoplasty for sleep apnea   TUBAL LIGATION Bilateral 1981   Family History  Problem Relation Age of Onset   Heart disease Sister   Family History Narrative: comprehensively reviewed across other provider's notes Father, deceased circa his late 48s/90s, w/ hx of Prostate Cancer, Diabetes Mellitus, type II, Hypertension, and Coronary Artery Disease Maternal Grand-Aunt w/ hx of Leukemia Mother, age 63 far as is known, w/ hx of Multiple Myeloma 8 Siblings, 4 Brothers & 4 Sisters - 1 brother deceased due to complications of Cancer (Unknown), 1 sister w/ hx of MI, and 1 Sister w/ hx of Rheumatoid Arthritis 2 Children, 1 son & 1 daughter healthy as far as is known Social History   Social History Narrative   Right handed    Native of [place], [place]. Attended [employer]. Retired - previously worked as a Engineer, production at [employer] Married x~50 years- husband is a retired Management consultant for [employer]. 2 adult children; 2 granddaughters - 1 granddaughter attending [employer]. Non-smoker or drinker. Frequently travels.    Most Recent Health Risks Assessment:   Medicare Risk at Home - 06/26/24 1110     Any stairs in or around the home? Yes    If so, are there any  without handrails? Yes    Home free of loose throw rugs in walkways, pet beds, electrical cords, etc? Yes    Adequate lighting in your home to reduce risk of falls? Yes    Life alert? No    Use of a cane, walker or w/c? No    Grab bars in the bathroom? No    Shower chair or bench in shower? No    Elevated toilet seat or a handicapped toilet? No         Most Recent Social Determinants of Health (Including Hx of Tobacco, Alcohol, and Drug Use) SDOH Screenings   Food Insecurity: No Food Insecurity (06/26/2024)  Housing: Low Risk  (06/26/2024)  Transportation Needs: No Transportation Needs (10/12/2023)  Utilities: Not At Risk (06/26/2024)  Alcohol Screen: Low Risk  (06/26/2024)  Depression (PHQ2-9): Low Risk  (06/26/2024)  Financial Resource Strain: Low Risk  (06/26/2024)  Physical Activity: Sufficiently Active (06/26/2024)  Social Connections: Socially Integrated (06/26/2024)  Stress: No Stress Concern Present (06/26/2024)  Tobacco Use: Low Risk  (06/26/2024)  Health Literacy: Adequate Health Literacy (06/26/2024)   Social History   Tobacco Use   Smoking status: Never   Smokeless tobacco: Never  Vaping Use   Vaping status: Never Used  Substance Use Topics   Alcohol use: Yes    Comment: very rare   Drug use: Never   Most Recent Functional Status Assessment:    06/26/2024   11:09 AM  In your present state of health, do you have any difficulty performing the following activities:  Hearing? 0  Vision? 0  Difficulty concentrating or making decisions? 0  Walking or climbing stairs? 0  Dressing or bathing? 0  Doing errands, shopping? 0  Preparing Food and eating ? N  Using the Toilet? N  In the past six months, have you accidently leaked  urine? N  Do you have problems with loss of bowel control? N  Managing your Medications? N  Managing your Finances? N  Housekeeping or managing your Housekeeping? N   Most Recent Fall Risk Assessment:    06/26/2024   11:11 AM  Fall Risk    Falls in the past year? 0  Number falls in past yr: 0  Injury with Fall? 0  Risk for fall due to : No Fall Risks  Follow up Falls prevention discussed;Education provided;Falls evaluation completed   Most Recent Depression Screenings:    06/26/2024   11:14 AM 10/12/2023   10:34 AM  PHQ 2/9 Scores  PHQ - 2 Score 0 0   Most Recent Cognitive Screening:    06/26/2024   11:11 AM  6CIT Screen  What Year? 0 points  What month? 0 points  What time? 0 points  Count back from 20 0 points  Months in reverse 0 points   Most Recent Vision/Hearing Screenings: No results found. Results:  Studies Obtained And Personally Reviewed By Me:  Diabetic Foot Exam - Simple   Simple Foot Form Diabetic Foot exam was performed with the following findings: Yes 06/26/2024 11:31 AM  Visual Inspection No deformities, no ulcerations, no other skin breakdown bilaterally: Yes Sensation Testing Intact to touch and monofilament testing bilaterally: Yes Pulse Check Posterior Tibialis and Dorsalis pulse intact bilaterally: Yes Comments    MRI ABDOMEN WITHOUT AND WITH CONTRAST  CLINICAL DATA:  Follow-up hepatic and pancreatic lesions.   COMPARISON:  Multiple priors including most recent MRI January 13, 2021   FINDINGS: Lower chest: No acute abnormality.   Hepatobiliary: Unchanged size of the 8 mm focus of delayed enhancement in the posterior dome of the liver segment VII image 40/904 this focus is isointense to background liver on T1 and T2.   Gallbladder surgically absent.  No biliary ductal dilation.   Pancreas: Cystic lesion in the pancreatic body/tail measures 5 mm on image 18/4 previously 6 mm, no suspicious postcontrast enhancement identified within the cyst. No pancreatic ductal dilation or evidence of acute inflammation.   Spleen:  Within normal limits in size and appearance.   Adrenals/Urinary Tract: Normal adrenal glands. Fluid signal 16 mm left interpolar renal lesion is compatible  with a cyst, considered benign and requires no independent imaging follow up. No evidence of hydronephrosis.   Stomach/Bowel: Visualized portions within the abdomen are unremarkable.   Vascular/Lymphatic: No pathologically enlarged lymph nodes identified. No abdominal aortic aneurysm demonstrated.   Other:  None.   Musculoskeletal: No suspicious bone lesions identified.   IMPRESSION: 1. Unchanged size of the 8 mm focus of delayed enhancement in the posterior dome of the liver segment VII. This focus is isointense to background liver on T1 and T2. Imaging findings which are nonspecific and remain indeterminate but interval stability is reassuring for a benign process.  2. Stable subcentimeter cystic lesion in the pancreatic body/tail, without suspicious MRI features. 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 Recommend follow up pre and post contrast MRI/MRCP in 2 years.   Eye exam 06/07/2024 by Dr. Dominique Bertin at [facility] did not detect diabetic retinopathy.  PAP Smear 11/17/2017 according to continuity of care documents.  Mammogram 11/05/2023 normal.  Colonoscopy 05/09/2018 found Small External & Internal Hemorrhoids; otherwise exam normal.  Bone Density 04/27/2012 was normal.    Labs:  CBC w/ Differential Lab Results  Component Value Date   WBC 7.1 06/20/2024   RBC 3.86 06/20/2024  HGB 11.4 (L) 06/20/2024   HCT 36.9 06/20/2024   PLT 235 06/20/2024   MCV 95.6 06/20/2024   MCH 29.5 06/20/2024   MCHC 30.9 (L) 06/20/2024   RDW 13.1 06/20/2024   MPV 11.8 06/20/2024   LYMPHSABS 2.7 04/03/2024   MONOABS 0.5 04/03/2024   BASOSABS 43 06/20/2024    Comprehensive Metabolic Panel Lab Results  Component Value Date   NA 142 06/20/2024   K 4.4 06/20/2024   CL 103 06/20/2024   CO2 29 06/20/2024   GLUCOSE 127 (H) 06/20/2024   BUN 14 06/20/2024   CREATININE 1.06 (H) 06/20/2024   CALCIUM 9.9 06/20/2024   PROT 7.6 06/20/2024   ALBUMIN 4.3 04/03/2024    AST 18 06/20/2024   ALT 16 06/20/2024   ALKPHOS 51 04/03/2024   BILITOT 0.3 06/20/2024   EGFR 47 (L) 09/27/2023   GFRNONAA 51 (L) 04/03/2024   Lipid Panel  Lab Results  Component Value Date   CHOL 165 06/20/2024   HDL 63 06/20/2024   LDLCALC 78 06/20/2024   TRIG 140 06/20/2024   A1c Lab Results  Component Value Date   HGBA1C 7.3 (H) 06/20/2024    TSH Lab Results  Component Value Date   TSH 1.21 06/20/2024   Assessment & Plan:   Orders Placed This Encounter  Procedures   POCT URINALYSIS DIP (CLINITEK)  Other Labs Reviewed today: CBC, compared to 05/2023: Hemoglobin 11.4, elevated from 11.3; MCHC 30.9, decreased from 32.4; otherwise WNL C-MET, compared to 05/2023: Glucose 127; Creatinine 1.06, decreased from 1.25; otherwise WNL.   TSH: 1.21   Hypertension treated with Amlodipine  10 mg daily, Clonidine  twice daily, and Olmesartan -HCTZ 40-25 mg daily. Blood Pressure: normotensive today at 120/80.   Hyperlipidemia treated with Simvastatin  40 mg daily. 06/20/2024 Lipid Panel: WNL.    Diabetes Mellitus, type II treated with Janumet -XR [phone] mg daily and Glipizide  10 mg daily.  06/20/2024 HgbA1c 7.3, decreased from 7.8 in January, still elevated from 6.9 in 05/2023; Glucose 127, decreased from 134 in May, still elevated from 115; Microalbumin: WNL at 2.5. Since last year, she has gained 4 lbs, and today weighs 185 lbsBMI 32.77. Eye exam 06/07/2024 by Dr. Dominique Bertin at [facility] did not detect diabetic retinopathy.  GERD treated with Nexium  40 mg daily.  Gout, which was managed with Allopurinol  100 mg daily, however she says that she had not been taking this due to her pharmacy no longer carrying this, and recently ate something that aggravated her gout, which she managed with benadryl and some topical cream.   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  History of Vitamin-D Deficiency treated with Vitamin-D 2000 units daily. 2019 Vitamin-D: 42, has not been repeated due to expense.  Osteoarthritis; Rheumatoid Arthritis treated with Norco 10-325 mg 0.5-1 full tablet every 6 hours sparingly as needed for pain, Medrol  tapering course.  History of Hyperandrogenism s/p BSO in 2021. PAP Smear 11/17/2017 according to continuity of care documents.  Mammogram 11/05/2023 normal with repeat recommendation of 2025.  Colonoscopy 05/09/2018 found Small External & Internal Hemorrhoids;  otherwise exam normal with repeat recommendation of 2029.  Bone Density 04/27/2012 was normal.    Vaccine Counseling: Due for Shingrix 1st dose; UTD on PNA and Tdap.  Return in 27 weeks (on 01/01/2025) for 6 month labs, and then on 01/05/2025 for 6 month follow-up, or as needed.   Annual Wellness Visit done today including the all of the following: Reviewed patient's Family Medical History and SDOH.  tobacco, alcohol, and drug use.  Reviewed and updated list of patient's medical providers Assessment of cognitive impairment was done Assessed patient's functional ability Established a written schedule for health screening services Health Risk Assessent Completed and Reviewed  Discussed health benefits of physical activity, and encouraged her to engage in regular exercise appropriate for her age and condition.    I,Emily Lagle,acting as a Neurosurgeon for Ronal JINNY Hailstone, MD.,have documented all relevant documentation on the behalf of Ronal JINNY Hailstone, MD,as directed by  Ronal JINNY Hailstone, MD while in  the presence of Ronal JINNY Hailstone, MD.   I, Ronal JINNY Hailstone, MD, have reviewed all documentation for this visit. The documentation on 06/29/24 for the exam, diagnosis, procedures, and orders are all accurate and complete.

## 2024-06-24 ENCOUNTER — Other Ambulatory Visit: Payer: Self-pay | Admitting: Family

## 2024-06-26 ENCOUNTER — Ambulatory Visit: Payer: Medicare PPO | Admitting: Internal Medicine

## 2024-06-26 ENCOUNTER — Encounter: Payer: Self-pay | Admitting: Internal Medicine

## 2024-06-26 ENCOUNTER — Other Ambulatory Visit: Payer: Self-pay

## 2024-06-26 VITALS — BP 120/80 | HR 78 | Ht 63.0 in | Wt 185.0 lb

## 2024-06-26 DIAGNOSIS — M1712 Unilateral primary osteoarthritis, left knee: Secondary | ICD-10-CM

## 2024-06-26 DIAGNOSIS — D890 Polyclonal hypergammaglobulinemia: Secondary | ICD-10-CM | POA: Diagnosis not present

## 2024-06-26 DIAGNOSIS — Z8739 Personal history of other diseases of the musculoskeletal system and connective tissue: Secondary | ICD-10-CM

## 2024-06-26 DIAGNOSIS — Z8679 Personal history of other diseases of the circulatory system: Secondary | ICD-10-CM | POA: Diagnosis not present

## 2024-06-26 DIAGNOSIS — Z0000 Encounter for general adult medical examination without abnormal findings: Secondary | ICD-10-CM | POA: Diagnosis not present

## 2024-06-26 DIAGNOSIS — I10 Essential (primary) hypertension: Secondary | ICD-10-CM

## 2024-06-26 DIAGNOSIS — M7918 Myalgia, other site: Secondary | ICD-10-CM

## 2024-06-26 DIAGNOSIS — G4730 Sleep apnea, unspecified: Secondary | ICD-10-CM

## 2024-06-26 DIAGNOSIS — K862 Cyst of pancreas: Secondary | ICD-10-CM

## 2024-06-26 DIAGNOSIS — E785 Hyperlipidemia, unspecified: Secondary | ICD-10-CM

## 2024-06-26 DIAGNOSIS — E119 Type 2 diabetes mellitus without complications: Secondary | ICD-10-CM | POA: Diagnosis not present

## 2024-06-26 DIAGNOSIS — Z7984 Long term (current) use of oral hypoglycemic drugs: Secondary | ICD-10-CM

## 2024-06-26 LAB — POCT URINALYSIS DIP (CLINITEK)
Bilirubin, UA: NEGATIVE
Blood, UA: NEGATIVE
Glucose, UA: NEGATIVE mg/dL
Ketones, POC UA: NEGATIVE mg/dL
Leukocytes, UA: NEGATIVE
Nitrite, UA: NEGATIVE
POC PROTEIN,UA: NEGATIVE
Spec Grav, UA: 1.01 (ref 1.010–1.025)
Urobilinogen, UA: 0.2 U/dL
pH, UA: 6.5 (ref 5.0–8.0)

## 2024-06-26 MED ORDER — ALLOPURINOL 100 MG PO TABS: 100.0000 mg | ORAL_TABLET | Freq: Every day | ORAL | 1 refills | Status: DC

## 2024-06-26 NOTE — Patient Instructions (Signed)
 Next appointment: Follow up in one year for your annual wellness visit    Preventive Care 65 Years and Older, Female Preventive care refers to lifestyle choices and visits with your health care provider that can promote health and wellness. What does preventive care include? A yearly physical exam. This is also called an annual well check. Dental exams once or twice a year. Routine eye exams. Ask your health care provider how often you should have your eyes checked. Personal lifestyle choices, including: Daily care of your teeth and gums. Regular physical activity. Eating a healthy diet. Avoiding tobacco and drug use. Limiting alcohol use. Practicing safe sex. Taking low-dose aspirin every day. Taking vitamin and mineral supplements as recommended by your health care provider. What happens during an annual well check? The services and screenings done by your health care provider during your annual well check will depend on your age, overall health, lifestyle risk factors, and family history of disease. Counseling  Your health care provider may ask you questions about your: Alcohol use. Tobacco use. Drug use. Emotional well-being. Home and relationship well-being. Sexual activity. Eating habits. History of falls. Memory and ability to understand (cognition). Work and work Astronomer. Reproductive health. Screening  You may have the following tests or measurements: Height, weight, and BMI. Blood pressure. Lipid and cholesterol levels. These may be checked every 5 years, or more frequently if you are over 1 years old. Skin check. Lung cancer screening. You may have this screening every year starting at age 59 if you have a 30-pack-year history of smoking and currently smoke or have quit within the past 15 years. Fecal occult blood test (FOBT) of the stool. You may have this test every year starting at age 58. Flexible sigmoidoscopy or colonoscopy. You may have a  sigmoidoscopy every 5 years or a colonoscopy every 10 years starting at age 51. Hepatitis C blood test. Hepatitis B blood test. Sexually transmitted disease (STD) testing. Diabetes screening. This is done by checking your blood sugar (glucose) after you have not eaten for a while (fasting). You may have this done every 1-3 years. Bone density scan. This is done to screen for osteoporosis. You may have this done starting at age 53. Mammogram. This may be done every 1-2 years. Talk to your health care provider about how often you should have regular mammograms. Talk with your health care provider about your test results, treatment options, and if necessary, the need for more tests. Vaccines  Your health care provider may recommend certain vaccines, such as: Influenza vaccine. This is recommended every year. Tetanus, diphtheria, and acellular pertussis (Tdap, Td) vaccine. You may need a Td booster every 10 years. Zoster vaccine. You may need this after age 55. Pneumococcal 13-valent conjugate (PCV13) vaccine. One dose is recommended after age 74. Pneumococcal polysaccharide (PPSV23) vaccine. One dose is recommended after age 92. Talk to your health care provider about which screenings and vaccines you need and how often you need them. This information is not intended to replace advice given to you by your health care provider. Make sure you discuss any questions you have with your health care provider. Document Released: 12/13/2015 Document Revised: 08/05/2016 Document Reviewed: 09/17/2015 Elsevier Interactive Patient Education  2017 [employer].  Fall Prevention in the Home Falls can cause injuries. They can happen to people of all ages. There are many things you can do to make your home safe and to help prevent falls. What can I do on the outside of  my home? Regularly fix the edges of walkways and driveways and fix any cracks. Remove anything that might make you trip as you walk through a  door, such as a raised step or threshold. Trim any bushes or trees on the path to your home. Use bright outdoor lighting. Clear any walking paths of anything that might make someone trip, such as rocks or tools. Regularly check to see if handrails are loose or broken. Make sure that both sides of any steps have handrails. Any raised decks and porches should have guardrails on the edges. Have any leaves, snow, or ice cleared regularly. Use sand or salt on walking paths during winter. Clean up any spills in your garage right away. This includes oil or grease spills. What can I do in the bathroom? Use night lights. Install grab bars by the toilet and in the tub and shower. Do not use towel bars as grab bars. Use non-skid mats or decals in the tub or shower. If you need to sit down in the shower, use a plastic, non-slip stool. Keep the floor dry. Clean up any water that spills on the floor as soon as it happens. Remove soap buildup in the tub or shower regularly. Attach bath mats securely with double-sided non-slip rug tape. Do not have throw rugs and other things on the floor that can make you trip. What can I do in the bedroom? Use night lights. Make sure that you have a light by your bed that is easy to reach. Do not use any sheets or blankets that are too big for your bed. They should not hang down onto the floor. Have a firm chair that has side arms. You can use this for support while you get dressed. Do not have throw rugs and other things on the floor that can make you trip. What can I do in the kitchen? Clean up any spills right away. Avoid walking on wet floors. Keep items that you use a lot in easy-to-reach places. If you need to reach something above you, use a strong step stool that has a grab bar. Keep electrical cords out of the way. Do not use floor polish or wax that makes floors slippery. If you must use wax, use non-skid floor wax. Do not have throw rugs and other things  on the floor that can make you trip. What can I do with my stairs? Do not leave any items on the stairs. Make sure that there are handrails on both sides of the stairs and use them. Fix handrails that are broken or loose. Make sure that handrails are as long as the stairways. Check any carpeting to make sure that it is firmly attached to the stairs. Fix any carpet that is loose or worn. Avoid having throw rugs at the top or bottom of the stairs. If you do have throw rugs, attach them to the floor with carpet tape. Make sure that you have a light switch at the top of the stairs and the bottom of the stairs. If you do not have them, ask someone to add them for you. What else can I do to help prevent falls? Wear shoes that: Do not have high heels. Have rubber bottoms. Are comfortable and fit you well. Are closed at the toe. Do not wear sandals. If you use a stepladder: Make sure that it is fully opened. Do not climb a closed stepladder. Make sure that both sides of the stepladder are locked into place. Ask  someone to hold it for you, if possible. Clearly mark and make sure that you can see: Any grab bars or handrails. First and last steps. Where the edge of each step is. Use tools that help you move around (mobility aids) if they are needed. These include: Canes. Walkers. Scooters. Crutches. Turn on the lights when you go into a dark area. Replace any light bulbs as soon as they burn out. Set up your furniture so you have a clear path. Avoid moving your furniture around. If any of your floors are uneven, fix them. If there are any pets around you, be aware of where they are. Review your medicines with your doctor. Some medicines can make you feel dizzy. This can increase your chance of falling. Ask your doctor what other things that you can do to help prevent falls. This information is not intended to replace advice given to you by your health care provider. Make sure you discuss any  questions you have with your health care provider. Document Released: 09/12/2009 Document Revised: 04/23/2016 Document Reviewed: 12/21/2014 Elsevier Interactive Patient Education  2017 [employer].

## 2024-06-26 NOTE — Progress Notes (Unsigned)
 Subjective:   Nancy Wagner is a 73 y.o. female who presents for Medicare Annual (Subsequent) preventive examination.  Visit Complete: In person  Patient Medicare AWV questionnaire was completed by the patient on 06/26/2024; I have confirmed that all information answered by patient is correct and no changes since this date.  Cardiac Risk Factors include: advanced age (>32men, >70 women);diabetes mellitus;dyslipidemia     Objective:    Today's Vitals   06/26/24 1101 06/26/24 1111  BP: 120/80   Pulse: 78   SpO2: 98%   Weight: 185 lb (83.9 kg)   Height: 5' 3 (1.6 m)   PainSc: 0-No pain 0-No pain   Body mass index is 32.77 kg/m.     06/26/2024   11:11 AM 05/22/2024   12:42 PM 06/21/2023    3:07 PM 06/18/2022   11:08 AM 12/14/2019   10:28 AM 07/13/2018    9:49 AM 05/09/2018    1:39 PM  Advanced Directives  Does Patient Have a Medical Advance Directive? Yes No No Yes No No  No   Type of Advance Directive Living will;Healthcare Power of [employer] Power of Attorney     Copy of Healthcare Power of Attorney in Chart?    No - copy requested     Would patient like information on creating a medical advance directive?   No - Patient declined  No - Patient declined No - Patient declined  No - Patient declined      Data saved with a previous flowsheet row definition    Current Medications (verified) Outpatient Encounter Medications as of 06/26/2024  Medication Sig   albuterol  (VENTOLIN  HFA) 108 (90 Base) MCG/ACT inhaler TAKE 2 PUFFS BY MOUTH EVERY 6 HOURS AS NEEDED FOR WHEEZE OR SHORTNESS OF BREATH   amLODipine  (NORVASC ) 10 MG tablet TAKE 1 TABLET BY MOUTH ONCE DAILY. CALL OFFICE FOR FURTHER REFILLS   Blood Glucose Monitoring Suppl (ACCU-CHEK GUIDE ME) w/Device KIT USE AS DIRECTED   cetirizine (ZYRTEC) 10 MG tablet Take 10 mg by mouth as needed.   Cholecalciferol (VITAMIN D3) 50 MCG (2000 UT) TABS Take 1 tablet by mouth daily.   cloNIDine  (CATAPRES ) 0.1 MG tablet TAKE 1  TABLET BY MOUTH TWICE A DAY   esomeprazole  (NEXIUM ) 40 MG capsule TAKE 1 CAPSULE BY MOUTH EVERY DAY   fish oil-omega-3 fatty acids 1000 MG capsule Take 2 g by mouth at bedtime. Reported on 06/18/2016 Taking 1 tablet   folic acid (FOLVITE) 400 MCG tablet Take 400 mcg by mouth at bedtime.    glipiZIDE  (GLUCOTROL  XL) 10 MG 24 hr tablet TAKE 1 TABLET (10 MG TOTAL) BY MOUTH DAILY WITH BREAKFAST.   glipiZIDE  (GLUCOTROL  XL) 10 MG 24 hr tablet Take 1 tablet (10 mg total) by mouth daily with breakfast.   HYDROcodone -acetaminophen  (NORCO) 10-325 MG tablet One half to one tab by mouth every 6 hours sparingly as needed for pain   JANUMET  XR [phone] MG TB24 TAKE 1 TABLET BY MOUTH EVERY DAY WITH BREAKFAST   methylPREDNISolone  (MEDROL ) 4 MG tablet Take 6 tabs by mouth on day 1 and decrease by one tab every 2 days  ie 6-6-5-5-4-4-3-3-2-2-1-1 taper   olmesartan -hydrochlorothiazide (BENICAR  HCT) 40-25 MG tablet TAKE 1 TABLET BY MOUTH EVERY DAY   simvastatin  (ZOCOR ) 40 MG tablet TAKE 1 TABLET BY MOUTH EVERYDAY AT BEDTIME   triamcinolone  cream (KENALOG ) 0.1 % Apply 1 Application topically 3 (three) times daily.   [DISCONTINUED] allopurinol  (ZYLOPRIM ) 100 MG tablet TAKE 1 TABLET BY  MOUTH EVERY DAY   No facility-administered encounter medications on file as of 06/26/2024.    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   GERD (gastroesophageal reflux disease)    diet control   Gout    12-08-2019  per pt last episode left foot 11/ 2020   Hirsutism    History of obstructive sleep apnea    s/p  UPPP in 1985   Hypertension    followed by pcp  (12-08-2019  per pt had stress test many yrs ago, was told ok)   Lichen simplex chronicus    OA (osteoarthritis)    RA (rheumatoid arthritis) (HCC)    12-08-2019  per pt  was dx years ago, treated with high level prednisone  for few weeks and some other treatment with her eyes and has been in remission since   Type 2 diabetes mellitus (HCC)    followed by pcp ---  (12-08-2019 checks cbg's daily in am,  fasting-- 110 to 126)   Uterine fibroid    Wears glasses    Past Surgical History:  Procedure Laterality Date   CARPAL TUNNEL RELEASE Right    CESAREAN SECTION  1975, 1978   CHOLECYSTECTOMY OPEN  1984   COLONOSCOPY  last one 05-09-2018   DILATATION & CURRETTAGE/HYSTEROSCOPY WITH RESECTOCOPE N/A 04/07/2017   Procedure: DILATATION & CURETTAGE/HYSTEROSCOPY WITH RESECTOCOPE;  Surgeon: Raeanne Shanda SQUIBB, MD;  Location: [facility];  Service: Gynecology;  Laterality: N/A;   ROBOTIC ASSISTED SALPINGO OOPHERECTOMY Bilateral 12/14/2019   Procedure: XI ROBOTIC ASSISTED SALPINGO OOPHORECTOMY;  Surgeon: Darcel Pool, MD;  Location: [facility];  Service: Gynecology;  Laterality: Bilateral;  Bed held, but patient is expected to go home the same day. -ap   THROAT SURGERY  1985   minimal uvulopalatopharyngoplasty for sleep apnea   TUBAL LIGATION Bilateral 1981   Family History  Problem Relation Age of Onset   Heart disease Sister    Social History   Socioeconomic History   Marital status: Married    Spouse name: Not on file   Number of children: 2   Years of education: Not on file   Highest education level: Not on file  Occupational History   Not on file  Tobacco Use   Smoking status: Never   Smokeless tobacco: Never  Vaping Use   Vaping status: Never Used  Substance and Sexual Activity   Alcohol use: Yes    Comment: very rare   Drug use: Never   Sexual activity: Not on file  Other Topics Concern   Not on file  Social History Narrative   Right handed    Social Drivers of Health   Financial Resource Strain: Low Risk  (06/26/2024)   Overall Financial Resource Strain (CARDIA)    Difficulty of Paying Living Expenses: Not very hard  Food  Insecurity: No Food Insecurity (06/26/2024)   Hunger Vital Sign    Worried About Running Out of Food in the Last Year: Never true    Ran Out of Food in the Last Year: Never true  Transportation Needs: No Transportation Needs (10/12/2023)   PRAPARE - Administrator, Civil Service (Medical): No    Lack of Transportation (Non-Medical): No  Physical Activity: Sufficiently Active (06/26/2024)   Exercise Vital Sign  Days of Exercise per Week: 7 days    Minutes of Exercise per Session: 30 min  Stress: No Stress Concern Present (06/26/2024)   [employer] of Occupational Health - Occupational Stress Questionnaire    Feeling of Stress: Not at all  Social Connections: Socially Integrated (06/26/2024)   Social Connection and Isolation Panel    Frequency of Communication with Friends and Family: More than three times a week    Frequency of Social Gatherings with Friends and Family: Three times a week    Attends Religious Services: More than 4 times per year    Active Member of Clubs or Organizations: Yes    Attends Engineer, structural: More than 4 times per year    Marital Status: Married    Tobacco Counseling Counseling given: Not Answered   Clinical Intake:  Pre-visit preparation completed: No  Pain : No/denies pain Pain Score: 0-No pain     BMI - recorded: 32.77 Nutritional Status: BMI > 30  Obese Diabetes: Yes  How often do you need to have someone help you when you read instructions, pamphlets, or other written materials from your doctor or pharmacy?: 1 - Never  Interpreter Needed?: No  Information entered by :: Kathlynn Porto, CMA   Activities of Daily Living    06/26/2024   11:09 AM  In your present state of health, do you have any difficulty performing the following activities:  Hearing? 0  Vision? 0  Difficulty concentrating or making decisions? 0  Walking or climbing stairs? 0  Dressing or bathing? 0  Doing errands, shopping? 0   Preparing Food and eating ? N  Using the Toilet? N  In the past six months, have you accidently leaked urine? N  Do you have problems with loss of bowel control? N  Managing your Medications? N  Managing your Finances? N  Housekeeping or managing your Housekeeping? N    Patient Care Team: Perri Ronal PARAS, MD as PCP - General (Internal Medicine) Santo Stanly LABOR, MD as PCP - Cardiology (Cardiology) [name], MD as Consulting Physician (Ophthalmology)  Indicate any recent Medical Services you may have received from other than [facility] providers in the past year (date may be approximate).     Assessment:   This is a routine wellness examination for Hannan.  Hearing/Vision screen No results found.   Goals Addressed   None    Depression Screen    06/26/2024   11:14 AM 10/12/2023   10:34 AM 06/21/2023    3:08 PM 04/22/2023   12:31 PM 02/22/2023   10:34 AM 12/25/2022    9:47 AM 10/16/2022   12:10 PM  PHQ 2/9 Scores  PHQ - 2 Score 0 0 0 0 0 0 0    Fall Risk    06/26/2024   11:11 AM 05/22/2024   12:42 PM 06/21/2023    3:07 PM 04/22/2023   12:31 PM 02/22/2023   10:34 AM  Fall Risk   Falls in the past year? 0 0 0 0 0  Number falls in past yr: 0 0 0 0 0  Injury with Fall? 0 0 0 1 0  Risk for fall due to : No Fall Risks  No Fall Risks No Fall Risks No Fall Risks  Follow up Falls prevention discussed;Education provided;Falls evaluation completed Falls evaluation completed Falls evaluation completed;Education provided;Falls prevention discussed Falls prevention discussed Falls prevention discussed    MEDICARE RISK AT HOME: Medicare Risk at Home Any stairs in or around the  home?: Yes If so, are there any without handrails?: Yes Home free of loose throw rugs in walkways, pet beds, electrical cords, etc?: Yes Adequate lighting in your home to reduce risk of falls?: Yes Life alert?: No Use of a cane, walker or w/c?: No Grab bars in the bathroom?: No Shower chair or  bench in shower?: No Elevated toilet seat or a handicapped toilet?: No  TIMED UP AND GO:  Was the test performed?  No    Cognitive Function:        06/26/2024   11:11 AM 06/21/2023    3:09 PM 06/18/2022   11:10 AM  6CIT Screen  What Year? 0 points 0 points 0 points  What month? 0 points 0 points 0 points  What time? 0 points 0 points 0 points  Count back from 20 0 points 0 points 0 points  Months in reverse 0 points 0 points 0 points  Repeat phrase  2 points 0 points  Total Score  2 points 0 points    Immunizations Immunization History  Administered Date(s) Administered   Hepatitis A, Adult 08/24/2022   Hepatitis A, Ped/Adol-2 Dose 02/22/2023   Influenza, High Dose Seasonal PF 09/10/2022   PFIZER Comirnaty(Gray Top)Covid-19 Tri-Sucrose Vaccine 03/11/2021, 09/02/2022   PFIZER(Purple Top)SARS-COV-2 Vaccination 02/29/2020, 04/19/2020   PNEUMOCOCCAL CONJUGATE-20 08/20/2022   Respiratory Syncytial Virus Vaccine,Recomb Aduvanted(Arexvy) 09/02/2022   Tdap 01/15/2012, 08/20/2022    TDAP status: Up to date  Flu Vaccine status: Up to date  Pneumococcal vaccine status: Up to date  Covid-19 vaccine status: Information provided on how to obtain vaccines.   Qualifies for Shingles Vaccine? Yes   Zostavax completed No   Shingrix Completed?: No.    Education has been provided regarding the importance of this vaccine. Patient has been advised to call insurance company to determine out of pocket expense if they have not yet received this vaccine. Advised may also receive vaccine at local pharmacy or Health Dept. Verbalized acceptance and understanding.  Screening Tests Health Maintenance  Topic Date Due   Zoster Vaccines- Shingrix (1 of 2) Never done   HEMOGLOBIN A1C  12/21/2024   OPHTHALMOLOGY EXAM  05/31/2025   Diabetic kidney evaluation - eGFR measurement  06/20/2025   Diabetic kidney evaluation - Urine ACR  06/20/2025   Medicare Annual Wellness (AWV)  06/20/2025   FOOT EXAM   06/26/2025   MAMMOGRAM  11/04/2025   Colonoscopy  05/09/2028   DTaP/Tdap/Td (3 - Td or Tdap) 08/20/2032   Pneumococcal Vaccine: 50+ Years  Completed   DEXA SCAN  Completed   Hepatitis B Vaccines  Aged Out   HPV VACCINES  Aged Out   Meningococcal B Vaccine  Aged Out   INFLUENZA VACCINE  Discontinued   COVID-19 Vaccine  Discontinued   Hepatitis C Screening  Discontinued    Health Maintenance  Health Maintenance Due  Topic Date Due   Zoster Vaccines- Shingrix (1 of 2) Never done    Colorectal cancer screening: Type of screening: Colonoscopy. Completed 05/09/2018. Repeat every 10 years  Mammogram status: Completed 11/05/2023. Repeat every year  Bone Density status: Completed 10/10/2020. Results reflect: Bone density results: NORMAL. Repeat every 2 years.  Lung Cancer Screening: (Low Dose CT Chest recommended if Age 47-80 years, 20 pack-year currently smoking OR have quit w/in 15years.) does not qualify.   Additional Screening:  Hepatitis C Screening: does not qualify; Completed   Vision Screening: Recommended annual ophthalmology exams for early detection of glaucoma and other disorders of the eye.  Is the patient up to date with their annual eye exam?  Yes  Who is the provider or what is the name of the office in which the patient attends annual eye exams? [facility] If pt is not established with a provider, would they like to be referred to a provider to establish care? No .   Dental Screening: Recommended annual dental exams for proper oral hygiene  Diabetic Foot Exam: Diabetic Foot Exam: Completed 06/26/2024  Community Resource Referral / Chronic Care Management: CRR required this visit?  No   CCM required this visit?  No     Plan:     I have personally reviewed and noted the following in the patient's chart:   Medical and social history Use of alcohol, tobacco or illicit drugs  Current medications and supplements including opioid prescriptions.  Patient is not currently taking opioid prescriptions. Functional ability and status Nutritional status Physical activity Advanced directives List of other physicians Hospitalizations, surgeries, and ER visits in previous 12 months Vitals Screenings to include cognitive, depression, and falls Referrals and appointments  In addition, I have reviewed and discussed with patient certain preventive protocols, quality metrics, and best practice recommendations. A written personalized care plan for preventive services as well as general preventive health recommendations were provided to patient.     Yony Roulston Zelda, CMA   06/26/2024   After Visit Summary: (In Person-Printed) AVS printed and given to the patient I, Ronal JINNY Hailstone, MD, have reviewed all documentation for this visit. The documentation on 06/29/24 for the exam, diagnosis, procedures, and orders are all accurate and complete.

## 2024-06-28 ENCOUNTER — Ambulatory Visit: Payer: Self-pay | Admitting: Cardiology

## 2024-07-17 ENCOUNTER — Other Ambulatory Visit: Payer: Self-pay

## 2024-07-17 MED ORDER — ESOMEPRAZOLE MAGNESIUM 40 MG PO CPDR: 40.0000 mg | DELAYED_RELEASE_CAPSULE | Freq: Every day | ORAL | 1 refills | Status: DC

## 2024-07-17 NOTE — Telephone Encounter (Signed)
 Was she supposed to get prednisone ?   Copied from CRM #[id number]. Topic: Clinical - Prescription Issue >> Jul 17, 2024  4:05 PM Delon DASEN wrote: Reason for CRM: esomeprazole  (NEXIUM ) 40 MG capsule and Prednisone  not received by pharmacy

## 2024-07-18 NOTE — Progress Notes (Signed)
 [facility] Summary Report / Loop Recorder

## 2024-07-20 ENCOUNTER — Ambulatory Visit (INDEPENDENT_AMBULATORY_CARE_PROVIDER_SITE_OTHER)

## 2024-07-20 ENCOUNTER — Ambulatory Visit: Payer: Self-pay | Admitting: Cardiology

## 2024-07-20 DIAGNOSIS — R55 Syncope and collapse: Secondary | ICD-10-CM | POA: Diagnosis not present

## 2024-07-20 LAB — CUP PACEART REMOTE DEVICE CHECK
Date Time Interrogation Session: 20250820233616
Implantable Pulse Generator Implant Date: 20230919

## 2024-08-21 ENCOUNTER — Ambulatory Visit: Payer: Self-pay | Admitting: Cardiology

## 2024-08-21 ENCOUNTER — Ambulatory Visit (INDEPENDENT_AMBULATORY_CARE_PROVIDER_SITE_OTHER)

## 2024-08-21 DIAGNOSIS — R55 Syncope and collapse: Secondary | ICD-10-CM

## 2024-08-21 LAB — CUP PACEART REMOTE DEVICE CHECK
Date Time Interrogation Session: 20250921232814
Implantable Pulse Generator Implant Date: 20230919

## 2024-08-22 NOTE — Progress Notes (Signed)
 Remote Loop Recorder Transmission

## 2024-09-04 NOTE — Progress Notes (Signed)
 Remote Loop Recorder Transmission

## 2024-09-19 ENCOUNTER — Encounter

## 2024-09-21 ENCOUNTER — Ambulatory Visit

## 2024-09-21 ENCOUNTER — Encounter

## 2024-09-21 DIAGNOSIS — R55 Syncope and collapse: Secondary | ICD-10-CM | POA: Diagnosis not present

## 2024-09-21 LAB — CUP PACEART REMOTE DEVICE CHECK
Date Time Interrogation Session: 20251022232003
Implantable Pulse Generator Implant Date: 20230919

## 2024-09-22 ENCOUNTER — Ambulatory Visit: Payer: Self-pay | Admitting: Cardiology

## 2024-09-22 NOTE — Progress Notes (Signed)
 Remote Loop Recorder Transmission

## 2024-09-26 ENCOUNTER — Other Ambulatory Visit: Payer: Self-pay | Admitting: Physician Assistant

## 2024-09-26 DIAGNOSIS — D472 Monoclonal gammopathy: Secondary | ICD-10-CM

## 2024-09-27 ENCOUNTER — Inpatient Hospital Stay: Attending: Internal Medicine

## 2024-10-11 ENCOUNTER — Other Ambulatory Visit: Payer: Self-pay | Admitting: Physician Assistant

## 2024-10-11 ENCOUNTER — Inpatient Hospital Stay

## 2024-10-11 ENCOUNTER — Inpatient Hospital Stay: Attending: Internal Medicine | Admitting: Physician Assistant

## 2024-10-11 VITALS — BP 140/58 | HR 64 | Temp 97.5°F | Resp 16 | Ht 63.0 in | Wt 183.7 lb

## 2024-10-11 DIAGNOSIS — D649 Anemia, unspecified: Secondary | ICD-10-CM | POA: Insufficient documentation

## 2024-10-11 DIAGNOSIS — D472 Monoclonal gammopathy: Secondary | ICD-10-CM | POA: Diagnosis not present

## 2024-10-11 DIAGNOSIS — Z7984 Long term (current) use of oral hypoglycemic drugs: Secondary | ICD-10-CM | POA: Diagnosis not present

## 2024-10-11 DIAGNOSIS — I10 Essential (primary) hypertension: Secondary | ICD-10-CM | POA: Diagnosis not present

## 2024-10-11 DIAGNOSIS — E119 Type 2 diabetes mellitus without complications: Secondary | ICD-10-CM | POA: Insufficient documentation

## 2024-10-11 DIAGNOSIS — M069 Rheumatoid arthritis, unspecified: Secondary | ICD-10-CM | POA: Diagnosis not present

## 2024-10-11 DIAGNOSIS — Z79899 Other long term (current) drug therapy: Secondary | ICD-10-CM | POA: Diagnosis not present

## 2024-10-11 DIAGNOSIS — J45909 Unspecified asthma, uncomplicated: Secondary | ICD-10-CM | POA: Diagnosis not present

## 2024-10-11 DIAGNOSIS — G4733 Obstructive sleep apnea (adult) (pediatric): Secondary | ICD-10-CM | POA: Insufficient documentation

## 2024-10-11 LAB — CMP (CANCER CENTER ONLY)
ALT: 17 U/L (ref 0–44)
AST: 19 U/L (ref 15–41)
Albumin: 4.3 g/dL (ref 3.5–5.0)
Alkaline Phosphatase: 54 U/L (ref 38–126)
Anion gap: 8 (ref 5–15)
BUN: 18 mg/dL (ref 8–23)
CO2: 30 mmol/L (ref 22–32)
Calcium: 10.1 mg/dL (ref 8.9–10.3)
Chloride: 102 mmol/L (ref 98–111)
Creatinine: 1.18 mg/dL — ABNORMAL HIGH (ref 0.44–1.00)
GFR, Estimated: 48 mL/min — ABNORMAL LOW (ref >60)
Glucose, Bld: 149 mg/dL — ABNORMAL HIGH (ref 70–99)
Potassium: 4.1 mmol/L (ref 3.5–5.1)
Sodium: 140 mmol/L (ref 135–145)
Total Bilirubin: 0.3 mg/dL (ref 0.0–1.2)
Total Protein: 8.8 g/dL — ABNORMAL HIGH (ref 6.5–8.1)

## 2024-10-11 LAB — CBC WITH DIFFERENTIAL (CANCER CENTER ONLY)
Abs Immature Granulocytes: 0.01 K/uL (ref 0.00–0.07)
Basophils Absolute: 0.1 K/uL (ref 0.0–0.1)
Basophils Relative: 1 %
Eosinophils Absolute: 0.2 K/uL (ref 0.0–0.5)
Eosinophils Relative: 3 %
HCT: 36.2 % (ref 36.0–46.0)
Hemoglobin: 11.7 g/dL — ABNORMAL LOW (ref 12.0–15.0)
Immature Granulocytes: 0 %
Lymphocytes Relative: 43 %
Lymphs Abs: 2.7 K/uL (ref 0.7–4.0)
MCH: 29.5 pg (ref 26.0–34.0)
MCHC: 32.3 g/dL (ref 30.0–36.0)
MCV: 91.2 fL (ref 80.0–100.0)
Monocytes Absolute: 0.5 K/uL (ref 0.1–1.0)
Monocytes Relative: 8 %
Neutro Abs: 2.8 K/uL (ref 1.7–7.7)
Neutrophils Relative %: 45 %
Platelet Count: 248 K/uL (ref 150–400)
RBC: 3.97 MIL/uL (ref 3.87–5.11)
RDW: 14.1 % (ref 11.5–15.5)
WBC Count: 6.3 K/uL (ref 4.0–10.5)
nRBC: 0 % (ref 0.0–0.2)

## 2024-10-11 NOTE — Progress Notes (Signed)
 [facility] [phone]   [phone]  PROGRESS NOTE  Patient Care Team: Perri Ronal PARAS, MD as PCP - General (Internal Medicine) Santo Stanly LABOR, MD as PCP - Cardiology (Cardiology) [name], MD as Consulting Physician (Ophthalmology)  Hematological/Oncological History # Monoclonal Gammopathy  04/11/2018: last visit with Dr. Perlov. Noted to have a polyclonal gammopathy 09/27/2023: SPEP reveals a restricted band (M-spike) migrating in the beta-2  globulin region  10/12/2023: establish care with Dr. Federico   CHIEF COMPLAINTS/PURPOSE OF CONSULTATION:  MGUS  HISTORY OF PRESENTING ILLNESS:  Nancy Wagner 73 y.o. female returns for a follow up for MGUS. She was last seen on 04/11/2024.  In the interim, she denies any changes to her health.  She is accompanied by her husband for this visit.  On exam today, Mrs. Hilley reports she is doing well without any new or concerning symptoms.  She does have joint pain in her shoulders that has flared up with the cold weather.  She denies any appetite or weight changes.  Her energy levels are fairly similar and she is able to complete her ADLs on her own.  She denies nausea, vomiting or bowel habit changes.  She denies easy bruising or signs of active bleeding.  She denies fevers, chills, night sweats, shortness of breath or chest pain or cough.  Rest of the 10 point ROS as below. 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   GERD (gastroesophageal reflux disease)    diet control   Gout    12-08-2019  per pt last episode left foot 11/ 2020   Hirsutism    History of obstructive sleep apnea    s/p  UPPP in 1985   Hypertension    followed by pcp  (12-08-2019  per pt had stress test many yrs ago, was told ok)   Lichen simplex chronicus    OA  (osteoarthritis)    RA (rheumatoid arthritis) (HCC)    12-08-2019  per pt was dx years ago, treated with high level prednisone  for few weeks and some other treatment with her eyes and has been in remission since   Type 2 diabetes mellitus (HCC)    followed by pcp ---  (12-08-2019 checks cbg's daily in am,  fasting-- 110 to 126)   Uterine fibroid    Wears glasses     SURGICAL HISTORY: Past Surgical History:  Procedure Laterality Date   CARPAL TUNNEL RELEASE Right    CESAREAN SECTION  1975, 1978   CHOLECYSTECTOMY OPEN  1984   COLONOSCOPY  last one 05-09-2018   DILATATION & CURRETTAGE/HYSTEROSCOPY WITH RESECTOCOPE N/A 04/07/2017   Procedure: DILATATION & CURETTAGE/HYSTEROSCOPY WITH RESECTOCOPE;  Surgeon: Raeanne Shanda SQUIBB, MD;  Location: [facility];  Service: Gynecology;  Laterality: N/A;   ROBOTIC ASSISTED SALPINGO OOPHERECTOMY Bilateral 12/14/2019   Procedure: XI ROBOTIC ASSISTED SALPINGO OOPHORECTOMY;  Surgeon: Darcel Pool, MD;  Location: [facility];  Service: Gynecology;  Laterality: Bilateral;  Bed held, but patient is expected to go home the same day. -ap   THROAT SURGERY  1985   minimal uvulopalatopharyngoplasty for sleep apnea   TUBAL LIGATION Bilateral 1981    SOCIAL HISTORY: Social History   Socioeconomic History   Marital status: Married    Spouse name: Not on file   Number of children: 2  Years of education: Not on file   Highest education level: Not on file  Occupational History   Not on file  Tobacco Use   Smoking status: Never   Smokeless tobacco: Never  Vaping Use   Vaping status: Never Used  Substance and Sexual Activity   Alcohol use: Yes    Comment: very rare   Drug use: Never   Sexual activity: Not on file  Other Topics Concern   Not on file  Social History Narrative   Right handed    Native of [place], [place]. Attended [employer]. Retired - previously worked as a engineer, production at [employer] Married x~50 years- husband is a  retired management consultant for [employer]. 2 adult children; 2 granddaughters - 1 granddaughter attending [employer]. Non-smoker or drinker. Frequently travels.   Social Drivers of Corporate Investment Banker Strain: Low Risk  (06/26/2024)   Overall Financial Resource Strain (CARDIA)    Difficulty of Paying Living Expenses: Not very hard  Food Insecurity: No Food Insecurity (06/26/2024)   Hunger Vital Sign    Worried About Running Out of Food in the Last Year: Never true    Ran Out of Food in the Last Year: Never true  Transportation Needs: No Transportation Needs (10/12/2023)   PRAPARE - Administrator, Civil Service (Medical): No    Lack of Transportation (Non-Medical): No  Physical Activity: Sufficiently Active (06/26/2024)   Exercise Vital Sign    Days of Exercise per Week: 7 days    Minutes of Exercise per Session: 30 min  Stress: No Stress Concern Present (06/26/2024)   [employer] of Occupational Health - Occupational Stress Questionnaire    Feeling of Stress: Not at all  Social Connections: Socially Integrated (06/26/2024)   Social Connection and Isolation Panel    Frequency of Communication with Friends and Family: More than three times a week    Frequency of Social Gatherings with Friends and Family: Three times a week    Attends Religious Services: More than 4 times per year    Active Member of Clubs or Organizations: Yes    Attends Banker Meetings: More than 4 times per year    Marital Status: Married  Catering Manager Violence: Not At Risk (06/26/2024)   Humiliation, Afraid, Rape, and Kick questionnaire    Fear of Current or Ex-Partner: No    Emotionally Abused: No    Physically Abused: No    Sexually Abused: No    FAMILY HISTORY: Family History  Problem Relation Age of Onset   Heart disease Sister     ALLERGIES:  is allergic to adhesive [tape], codeine, and other.  MEDICATIONS:  Current Outpatient  Medications  Medication Sig Dispense Refill   albuterol  (VENTOLIN  HFA) 108 (90 Base) MCG/ACT inhaler TAKE 2 PUFFS BY MOUTH EVERY 6 HOURS AS NEEDED FOR WHEEZE OR SHORTNESS OF BREATH 8.5 each prn   allopurinol  (ZYLOPRIM ) 100 MG tablet Take 1 tablet (100 mg total) by mouth daily. 90 tablet 1   amLODipine  (NORVASC ) 10 MG tablet TAKE 1 TABLET BY MOUTH ONCE DAILY. CALL OFFICE FOR FURTHER REFILLS 90 tablet 1   Blood Glucose Monitoring Suppl (ACCU-CHEK GUIDE ME) w/Device KIT USE AS DIRECTED 1 kit prn   cetirizine (ZYRTEC) 10 MG tablet Take 10 mg by mouth as needed.     Cholecalciferol (VITAMIN D3) 50 MCG (2000 UT) TABS Take 1 tablet by mouth daily.     cloNIDine  (CATAPRES )  0.1 MG tablet TAKE 1 TABLET BY MOUTH TWICE A DAY 180 tablet 3   esomeprazole  (NEXIUM ) 40 MG capsule Take 1 capsule (40 mg total) by mouth daily at 12 noon. 90 capsule 1   fish oil-omega-3 fatty acids 1000 MG capsule Take 2 g by mouth at bedtime. Reported on 06/18/2016 Taking 1 tablet     folic acid (FOLVITE) 400 MCG tablet Take 400 mcg by mouth at bedtime.      glipiZIDE  (GLUCOTROL  XL) 10 MG 24 hr tablet Take 1 tablet (10 mg total) by mouth daily with breakfast. 90 tablet 3   JANUMET  XR [phone] MG TB24 TAKE 1 TABLET BY MOUTH EVERY DAY WITH BREAKFAST 90 tablet 3   methylPREDNISolone  (MEDROL ) 4 MG tablet Take 6 tabs by mouth on day 1 and decrease by one tab every 2 days  ie 6-6-5-5-4-4-3-3-2-2-1-1 taper 42 tablet 0   olmesartan -hydrochlorothiazide (BENICAR  HCT) 40-25 MG tablet TAKE 1 TABLET BY MOUTH EVERY DAY 90 tablet 1   simvastatin  (ZOCOR ) 40 MG tablet TAKE 1 TABLET BY MOUTH EVERYDAY AT BEDTIME 90 tablet 3   triamcinolone  cream (KENALOG ) 0.1 % Apply 1 Application topically 3 (three) times daily. 30 g 0   glipiZIDE  (GLUCOTROL  XL) 10 MG 24 hr tablet TAKE 1 TABLET (10 MG TOTAL) BY MOUTH DAILY WITH BREAKFAST. 90 tablet 3   HYDROcodone -acetaminophen  (NORCO) 10-325 MG tablet One half to one tab by mouth every 6 hours sparingly as needed for  pain (Patient not taking: Reported on 10/11/2024) 15 tablet 0   No current facility-administered medications for this visit.    REVIEW OF SYSTEMS:   Constitutional: ( - ) fevers, ( - )  chills , ( - ) night sweats Eyes: ( - ) blurriness of vision, ( - ) double vision, ( - ) watery eyes Ears, nose, mouth, throat, and face: ( - ) mucositis, ( - ) sore throat Respiratory: ( - ) cough, ( - ) dyspnea, ( - ) wheezes Cardiovascular: ( - ) palpitation, ( - ) chest discomfort, ( - ) lower extremity swelling Gastrointestinal:  ( - ) nausea, ( - ) heartburn, ( - ) change in bowel habits Skin: ( - ) abnormal skin rashes Lymphatics: ( - ) new lymphadenopathy, ( - ) easy bruising Neurological: ( - ) numbness, ( - ) tingling, ( - ) new weaknesses Behavioral/Psych: ( - ) mood change, ( - ) new changes  All other systems were reviewed with the patient and are negative.  PHYSICAL EXAMINATION:  Vitals:   10/11/24 1000  BP: (!) 140/58  Pulse: 64  Resp: 16  Temp: (!) 97.5 F (36.4 C)  SpO2: 99%    Filed Weights   10/11/24 1000  Weight: 183 lb 11.2 oz (83.3 kg)     GENERAL: well appearing elderly African-American female in NAD  SKIN: skin color, texture, turgor are normal, no rashes or significant lesions EYES: conjunctiva are pink and non-injected, sclera clear LUNGS: clear to auscultation and percussion with normal breathing effort HEART: regular rate & rhythm and no murmurs and no lower extremity edema Musculoskeletal: no cyanosis of digits and no clubbing  PSYCH: alert & oriented x 3, fluent speech NEURO: no focal motor/sensory deficits  LABORATORY DATA:  I have reviewed the data as listed    Latest Ref Rng & Units 10/11/2024    9:35 AM 06/20/2024    9:56 AM 04/03/2024   10:12 AM  CBC  WBC 4.0 - 10.5 K/uL 6.3  7.1  6.7  Hemoglobin 12.0 - 15.0 g/dL 88.2  88.5  88.5   Hematocrit 36.0 - 46.0 % 36.2  36.9  34.5   Platelets 150 - 400 K/uL 248  235  253        Latest Ref Rng & Units  10/11/2024    9:35 AM 06/20/2024    9:56 AM 04/03/2024   10:12 AM  CMP  Glucose 70 - 99 mg/dL 850  872  865   BUN 8 - 23 mg/dL 18  14  19    Creatinine 0.44 - 1.00 mg/dL 8.81  8.93  8.86   Sodium 135 - 145 mmol/L 140  142  139   Potassium 3.5 - 5.1 mmol/L 4.1  4.4  4.2   Chloride 98 - 111 mmol/L 102  103  104   CO2 22 - 32 mmol/L 30  29  29    Calcium 8.9 - 10.3 mg/dL 89.8  9.9  89.9   Total Protein 6.5 - 8.1 g/dL 8.8  7.6  8.3   Total Bilirubin 0.0 - 1.2 mg/dL 0.3  0.3  0.3   Alkaline Phos 38 - 126 U/L 54   51   AST 15 - 41 U/L 19  18  21    ALT 0 - 44 U/L 17  16  19       ASSESSMENT & PLAN Iris Hairston is a 73 y.o. y.o. female who returns for a follow up for MGUS.   #Monoclonal Gammopathy of Undetermined Significance --Labs from today reviewed.  CBC revealed very mild anemia with hemoglobin 11.7.  No other cytopenias identified.  CMP showed stable creatinine levels of 1.18.  Calcium levels normal.  Also myeloma panel and serum free light chains are pending. --Most recent bone met survey from 10/26/2023 showed no evidence of lytic lesions.  Due now scheduled for 10/18/2024..  --Most recent 24-hour UPEP from 10/25/2023 was unremarkable without any evidence of monoclonal protein. Due now. --No indication for bone marrow biopsy at this time, pending remaining labs from today.  --RTC in 6 months with repeat labs.  # Arthritic pain involving shoulders and knees: ---Bone met survey from 10/26/2023 did show facet hypertrophy in the lower lumbar spine without any lytic lesions.  --Recommend to follow up with PCP and ortho  to determine if she is a candidate for steroid injections to help with pain relief.   No orders of the defined types were placed in this encounter.   All questions were answered. The patient knows to call the clinic with any problems, questions or concerns.  I have spent a total of 25 minutes minutes of face-to-face and non-face-to-face time, preparing to see the  patient, , performing a medically appropriate examination, counseling and educating the patient, ordering tests/procedures, documenting clinical information in the electronic health record, independently interpreting results and communicating results to the patient, and care coordination.   Johnston Police PA-C Dept of Hematology and Oncology [facility] Phone: [phone]   10/11/2024 3:20 PM

## 2024-10-12 LAB — KAPPA/LAMBDA LIGHT CHAINS
Kappa free light chain: 52 mg/L — ABNORMAL HIGH (ref 3.3–19.4)
Kappa, lambda light chain ratio: 1.29 (ref 0.26–1.65)
Lambda free light chains: 40.4 mg/L — ABNORMAL HIGH (ref 5.7–26.3)

## 2024-10-13 ENCOUNTER — Other Ambulatory Visit: Payer: Self-pay | Admitting: Internal Medicine

## 2024-10-13 DIAGNOSIS — Z1231 Encounter for screening mammogram for malignant neoplasm of breast: Secondary | ICD-10-CM

## 2024-10-16 LAB — MULTIPLE MYELOMA PANEL, SERUM
Albumin SerPl Elph-Mcnc: 4 g/dL (ref 2.9–4.4)
Albumin/Glob SerPl: 0.9 (ref 0.7–1.7)
Alpha 1: 0.3 g/dL (ref 0.0–0.4)
Alpha2 Glob SerPl Elph-Mcnc: 1.1 g/dL — ABNORMAL HIGH (ref 0.4–1.0)
B-Globulin SerPl Elph-Mcnc: 1.5 g/dL — ABNORMAL HIGH (ref 0.7–1.3)
Gamma Glob SerPl Elph-Mcnc: 1.7 g/dL (ref 0.4–1.8)
Globulin, Total: 4.5 g/dL — ABNORMAL HIGH (ref 2.2–3.9)
IgA: 705 mg/dL — ABNORMAL HIGH (ref 64–422)
IgG (Immunoglobin G), Serum: 1547 mg/dL (ref 586–1602)
IgM (Immunoglobulin M), Srm: 530 mg/dL — ABNORMAL HIGH (ref 26–217)
Total Protein ELP: 8.5 g/dL (ref 6.0–8.5)

## 2024-10-18 ENCOUNTER — Ambulatory Visit (HOSPITAL_COMMUNITY)
Admission: RE | Admit: 2024-10-18 | Discharge: 2024-10-18 | Disposition: A | Discharge status: Home / Self Care | Source: Ambulatory Visit | Attending: Physician Assistant | Admitting: Physician Assistant

## 2024-10-18 DIAGNOSIS — D472 Monoclonal gammopathy: Secondary | ICD-10-CM | POA: Diagnosis not present

## 2024-10-18 DIAGNOSIS — M170 Bilateral primary osteoarthritis of knee: Secondary | ICD-10-CM | POA: Diagnosis not present

## 2024-10-18 DIAGNOSIS — M160 Bilateral primary osteoarthritis of hip: Secondary | ICD-10-CM | POA: Diagnosis not present

## 2024-10-18 DIAGNOSIS — M47819 Spondylosis without myelopathy or radiculopathy, site unspecified: Secondary | ICD-10-CM | POA: Diagnosis not present

## 2024-10-19 ENCOUNTER — Other Ambulatory Visit: Payer: Self-pay | Admitting: *Deleted

## 2024-10-19 DIAGNOSIS — Z79899 Other long term (current) drug therapy: Secondary | ICD-10-CM | POA: Diagnosis not present

## 2024-10-19 DIAGNOSIS — G4733 Obstructive sleep apnea (adult) (pediatric): Secondary | ICD-10-CM | POA: Diagnosis not present

## 2024-10-19 DIAGNOSIS — D472 Monoclonal gammopathy: Secondary | ICD-10-CM | POA: Diagnosis not present

## 2024-10-19 DIAGNOSIS — I10 Essential (primary) hypertension: Secondary | ICD-10-CM | POA: Diagnosis not present

## 2024-10-20 ENCOUNTER — Encounter

## 2024-10-22 ENCOUNTER — Ambulatory Visit

## 2024-10-22 DIAGNOSIS — R55 Syncope and collapse: Secondary | ICD-10-CM | POA: Diagnosis not present

## 2024-10-23 ENCOUNTER — Encounter

## 2024-10-23 ENCOUNTER — Ambulatory Visit: Payer: Self-pay

## 2024-10-23 LAB — CUP PACEART REMOTE DEVICE CHECK
Date Time Interrogation Session: 20251122231649
Implantable Pulse Generator Implant Date: 20230919

## 2024-10-23 NOTE — Telephone Encounter (Signed)
 Pt advised with VU

## 2024-10-23 NOTE — Telephone Encounter (Signed)
-----   Message from Johnston ONEIDA Police sent at 10/23/2024 10:45 AM EST ----- Please notify patient that there are no lytic bone lesions.   ----- Message ----- From: Interface, Rad Results In Sent: 10/22/2024   4:53 PM EST To: Johnston ONEIDA Police, PA-C

## 2024-10-24 LAB — UPEP/UIFE/LIGHT CHAINS/TP, 24-HR UR
% BETA, Urine: 0 %
ALPHA 1 URINE: 0 %
Albumin, U: 100 %
Alpha 2, Urine: 0 %
Free Kappa Lt Chains,Ur: 16.25 mg/L (ref 1.17–86.46)
Free Kappa/Lambda Ratio: 10.42 (ref 1.83–14.26)
Free Lambda Lt Chains,Ur: 1.56 mg/L (ref 0.27–15.21)
GAMMA GLOBULIN URINE: 0 %
Total Protein, Urine-Ur/day: 161 mg/(24.h) — ABNORMAL HIGH (ref 30–150)
Total Protein, Urine: 12.4 mg/dL
Total Volume: 1300

## 2024-10-24 NOTE — Progress Notes (Signed)
 Remote Loop Recorder Transmission

## 2024-10-25 ENCOUNTER — Ambulatory Visit: Attending: Cardiology | Admitting: Cardiology

## 2024-10-25 ENCOUNTER — Encounter: Payer: Self-pay | Admitting: Cardiology

## 2024-10-25 ENCOUNTER — Ambulatory Visit: Payer: Self-pay | Admitting: Cardiology

## 2024-10-25 VITALS — BP 138/56 | HR 60 | Ht 63.0 in | Wt 182.0 lb

## 2024-10-25 DIAGNOSIS — R0609 Other forms of dyspnea: Secondary | ICD-10-CM | POA: Diagnosis not present

## 2024-10-25 DIAGNOSIS — E785 Hyperlipidemia, unspecified: Secondary | ICD-10-CM

## 2024-10-25 DIAGNOSIS — R072 Precordial pain: Secondary | ICD-10-CM | POA: Diagnosis not present

## 2024-10-25 DIAGNOSIS — R0989 Other specified symptoms and signs involving the circulatory and respiratory systems: Secondary | ICD-10-CM | POA: Insufficient documentation

## 2024-10-25 DIAGNOSIS — E782 Mixed hyperlipidemia: Secondary | ICD-10-CM | POA: Diagnosis not present

## 2024-10-25 DIAGNOSIS — E1169 Type 2 diabetes mellitus with other specified complication: Secondary | ICD-10-CM | POA: Diagnosis not present

## 2024-10-25 MED ORDER — ROSUVASTATIN CALCIUM 20 MG PO TABS: 20.0000 mg | ORAL_TABLET | Freq: Every day | ORAL | 3 refills | Status: AC

## 2024-10-25 MED ORDER — NITROGLYCERIN 0.4 MG SL SUBL: 0.4000 mg | SUBLINGUAL_TABLET | SUBLINGUAL | 2 refills | Status: AC | PRN

## 2024-10-25 MED ORDER — ASPIRIN 81 MG PO TBEC: 81.0000 mg | DELAYED_RELEASE_TABLET | Freq: Every day | ORAL | 3 refills | Status: AC

## 2024-10-25 NOTE — Progress Notes (Signed)
 Cardiology Office Note:  .   Date:  10/25/2024  ID:  Nancy Wagner, DOB 1951-08-14, MRN [id number] PCP: Perri Ronal PARAS, MD  [facility] Providers Cardiologist:  Newman Lawrence, MD PCP: Perri Ronal PARAS, MD  Chief Complaint  Patient presents with   Chest pain     Nancy Wagner is a 73 y.o. 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  History of Present Illness Nancy Wagner is a 73 year old female with hypertension, hyperlipidemia, and diabetes who presents for routine follow-up and evaluation of intermittent chest pain and shortness of breath.  She describes past intermittent episodes of chest pain and shortness of breath lasting a few minutes, improving with rest and deep breathing. She has not had recent episodes but they were concerning enough for her to seek care. Her hypertension is treated with amlodipine  10 mg, clonidine  0.1 mg twice daily, and olmesartan /hydrochlorothiazide 40/25 mg, with reportedly good blood pressure control. She takes simvastatin  40 mg for hyperlipidemia but has muscle aches. Her diabetes is reported as well controlled with a recent A1c of 7.3. She previously wore a heart monitor for nocturnal heart rate drops, which led to discontinuation of atenolol . She currently denies chest pain and shortness of breath.      Vitals:   10/25/24 0912  BP: (!) 138/56  Pulse: 60  SpO2: 97%      Review of Systems  Cardiovascular:  Positive for chest pain and dyspnea on exertion. Negative for leg swelling, palpitations and syncope.        Studies Reviewed: SABRA       EKG 10/25/2024: Normal sinus rhythm with sinus arrhythmia Normal ECG When compared with ECG of 11-Dec-2019 09:27, No significant change was found    ILR check 09/2024: Normal ILR  reviewed. No arrhythmias seen, normal histograms. Programmed parameters appropriate   Labs 7-09/2024: Chol 165, TG 140, HDL 63, LDL 78 HbA1C 7.3% Hb 11.7 Cr 1.18, eGFR 48 TSH 1.2   Physical Exam Vitals and nursing note reviewed.  Constitutional:      General: She is not in acute distress. Neck:     Vascular: No JVD.  Cardiovascular:     Rate and Rhythm: Normal rate and regular rhythm.     Pulses:          Carotid pulses are  on the right side with bruit and  on the left side with bruit.    Heart sounds: Murmur heard.     Harsh midsystolic murmur is present with a grade of 2/6 at the upper right sternal border radiating to the neck.  Pulmonary:     Effort: Pulmonary effort is normal.     Breath sounds: Normal breath sounds. No wheezing or rales.  Musculoskeletal:     Right lower leg: No edema.     Left lower leg: No edema.      VISIT DIAGNOSES:   ICD-10-CM   1. Mixed hyperlipidemia  E78.2 EKG 12-Lead    2. Precordial pain  R07.2 EKG 12-Lead    3. Hyperlipidemia associated with type 2 diabetes mellitus (HCC)  E11.69 EKG 12-Lead   E78.5     4. DOE (dyspnea on exertion)  R06.09 ECHOCARDIOGRAM COMPLETE    5. Bilateral carotid bruits  R09.89 VAS US  CAROTID       Nancy  Wagner is a 73 y.o. female with hypertension, hyperlipidemia, type 2 diabetes mellitus, obesity, h/o Mobitz type I and type II AV block  Assessment & Plan Chest pain, dyspnea on exertion: Intermittent chest pain and shortness of breath suggest possible coronary artery disease, especially with diabetes increasing risk. Differential includes significant arterial narrowing. With her baseline GFR of 48, I would avoid coronary CT angiogram. Instead, recommend PET/CT stress test for ischemia evaluation. Pending further workup, recommend aspirin  81 mg daily, and sublingual nitroglycerin  for as needed use. Of note, beta-blocker was previously stopped due to nocturnal Mobitz type I and type II AV block.   Therefore, would like to avoid beta-blocker in future, if possible.  Murmur: 2/6 systolic murmur at aortic area. Recommend echocardiogram.  Carotid artery bruit: Bilateral carotid artery bruit. Check carotid duplex ultrasound.  Type 2 diabetes mellitus: Diabetes is well controlled with an A1c of 7.3, slightly above the goal. Increased risk of coronary artery disease due to diabetes. - Continue current diabetes management and monitor A1c levels.  Mixed hyperlipidemia: Cholesterol levels are good but could be improved. Current LDL is 78, with a goal of less than 70 due to diabetes. Simvastatin  and amlodipine  combination increases risk of myalgias. - Discontinued simvastatin . - Started Crestor  20 mg once daily. - Sent Crestor  prescription to pharmacy.  Hypertension: Blood pressure is well controlled with current medication regimen. - Continue current antihypertensive medications.     Informed Consent   Shared Decision Making/Informed Consent The risks [chest pain, shortness of breath, cardiac arrhythmias, dizziness, blood pressure fluctuations, myocardial infarction, stroke/transient ischemic attack, nausea, vomiting, allergic reaction, radiation exposure, metallic taste sensation and life-threatening complications (estimated to be 1 in 10,000)], benefits (risk stratification, diagnosing coronary artery disease, treatment guidance) and alternatives of a cardiac PET stress test were discussed in detail with Ms. Baldridge and she agrees to proceed.       Meds ordered this encounter  Medications   aspirin  EC 81 MG tablet    Sig: Take 1 tablet (81 mg total) by mouth daily. Swallow whole.    Dispense:  90 tablet    Refill:  3   nitroGLYCERIN  (NITROSTAT ) 0.4 MG SL tablet    Sig: Place 1 tablet (0.4 mg total) under the tongue every 5 (five) minutes as needed.    Dispense:  25 tablet    Refill:  2   rosuvastatin  (CRESTOR ) 20 MG tablet    Sig: Take 1 tablet (20 mg total) by mouth daily.     Dispense:  90 tablet    Refill:  3     F/u in 3 months  Signed, Newman JINNY Lawrence, MD

## 2024-10-25 NOTE — Patient Instructions (Addendum)
 Medication Instructions:  STOP Simvastatin   START Crestor  20 mg daily START AS NEEDED FOR CHEST PAIN: Nitroglycerin   START Aspirin  81 mg daily   *If you need a refill on your cardiac medications before your next appointment, please call your pharmacy*  Testing/Procedures: Echocardiogram  Your physician has requested that you have an echocardiogram. Echocardiography is a painless test that uses sound waves to create images of your heart. It provides your doctor with information about the size and shape of your heart and how well your heart's chambers and valves are working. This procedure takes approximately one hour. There are no restrictions for this procedure. Please do NOT wear cologne, perfume, aftershave, or lotions (deodorant is allowed). Please arrive 15 minutes prior to your appointment time.  Please note: We ask at that you not bring children with you during ultrasound (echo/ vascular) testing. Due to room size and safety concerns, children are not allowed in the ultrasound rooms during exams. Our front office staff cannot provide observation of children in our lobby area while testing is being conducted. An adult accompanying a patient to their appointment will only be allowed in the ultrasound room at the discretion of the ultrasound technician under special circumstances. We apologize for any inconvenience.  Carotid US    Your physician has requested that you have a carotid duplex. This test is an ultrasound of the carotid arteries in your neck. It looks at blood flow through these arteries that supply the brain with blood. Allow one hour for this exam. There are no restrictions or special instructions.   Pet/CT Stress  How to Prepare for Your Cardiac PET/CT Stress Test:  1. Please do not take these medications before your test:  ~Medications that may interfere with the cardiac pharmacological stress agent (ex. nitrates - including erectile dysfunction medications, isosorbide  mononitrate, tamulosin or beta-blockers) the day of the exam. (Erectile dysfunction medication should be held for at least 72 hrs prior to test) ~Theophylline containing medications for 12 hours. ~Dipyridamole 48 hours prior to the test. ~Your remaining medications may be taken with water.  2. Nothing to eat or drink, except water, 3 hours prior to arrival time.   ~ NO caffeine/decaffeinated products, or chocolate 12 hours prior to arrival.  3. NO perfume, cologne or lotion on chest or abdomen area.         - FEMALES - Please avoid wearing dresses to this appointment.  4. Total time is 1 to 2 hours; you may want to bring reading material for the waiting time.  Please report to Radiology at the [facility] Main Entrance 30 minutes early for your test. [address] [place], [place] [address]  Diabetic Preparation: - Hold oral medications. - You may take NPH and Lantus insulin. - Do not take Humalog or Humulin R (Regular Insulin) the day of your test. - Check blood sugars prior to leaving the house. - If able to eat breakfast prior to 3 hour fasting, you may take all medications, including your insulin, - Do not worry if you miss your breakfast dose of insulin - start at your next meal. - Patients who wear a continuous glucose monitor MUST remove the device prior to scanning.  IF YOU THINK YOU MAY BE PREGNANT, OR ARE NURSING PLEASE INFORM THE TECHNOLOGIST.  In preparation for your appointment, medication and supplies will be purchased.  Appointment availability is limited, so if you need to cancel or reschedule, please call the Radiology Department at [phone] [facility]) OR  [phone] [facility])  24 hours in advance to avoid a cancellation fee of $100.00  What to Expect After you Arrive:  Once you arrive and check in for your appointment, you will be taken to a preparation room within the Radiology Department.  A technologist or Nurse will obtain your medical history,  verify that you are correctly prepped for the exam, and explain the procedure.  Afterwards,  an IV will be started in your arm and electrodes will be placed on your skin for EKG monitoring during the stress portion of the exam. Then you will be escorted to the PET/CT scanner.  There, staff will get you positioned on the scanner and obtain a blood pressure and EKG.  During the exam, you will continue to be connected to the EKG and blood pressure machines.  A small, safe amount of a radioactive tracer will be injected in your IV to obtain a series of pictures of your heart along with an injection of a stress agent.    After your Exam:  It is recommended that you eat a meal and drink a caffeinated beverage to counter act any effects of the stress agent.  Drink plenty of fluids for the remainder of the day and urinate frequently for the first couple of hours after the exam.  Your doctor will inform you of your test results within 7-10 business days.  For more information and frequently asked questions, please visit our website : [email or web address]  For questions about your test or how to prepare for your test, please call: Cardiac Imaging Nurse Navigators Office: [phone]    Follow-Up: At [facility], you and your health needs are our priority.  As part of our continuing mission to provide you with exceptional heart care, our providers are all part of one team.  This team includes your primary Cardiologist (physician) and Advanced Practice Providers or APPs (Physician Assistants and Nurse Practitioners) who all work together to provide you with the care you need, when you need it.  Your next appointment:   01/2025   Provider:   Newman JINNY Lawrence, MD    We recommend signing up for the patient portal called MyChart.  Sign up information is provided on this After Visit Summary.  MyChart is used to connect with patients for Virtual Visits (Telemedicine).  Patients are  able to view lab/test results, encounter notes, upcoming appointments, etc.  Non-urgent messages can be sent to your provider as well.   To learn more about what you can do with MyChart, go to [email or web address].

## 2024-10-27 ENCOUNTER — Other Ambulatory Visit: Payer: Self-pay | Admitting: Internal Medicine

## 2024-10-30 ENCOUNTER — Other Ambulatory Visit: Payer: Self-pay | Admitting: Internal Medicine

## 2024-11-04 ENCOUNTER — Other Ambulatory Visit: Payer: Self-pay | Admitting: Internal Medicine

## 2024-11-07 ENCOUNTER — Ambulatory Visit: Payer: Self-pay

## 2024-11-07 ENCOUNTER — Encounter: Payer: Self-pay | Admitting: Internal Medicine

## 2024-11-07 ENCOUNTER — Ambulatory Visit: Admitting: Internal Medicine

## 2024-11-07 VITALS — BP 130/80 | HR 80 | Temp 98.8°F | Ht 63.0 in | Wt 170.0 lb

## 2024-11-07 DIAGNOSIS — G44209 Tension-type headache, unspecified, not intractable: Secondary | ICD-10-CM

## 2024-11-07 DIAGNOSIS — I10 Essential (primary) hypertension: Secondary | ICD-10-CM

## 2024-11-07 DIAGNOSIS — M0580 Other rheumatoid arthritis with rheumatoid factor of unspecified site: Secondary | ICD-10-CM

## 2024-11-07 DIAGNOSIS — D890 Polyclonal hypergammaglobulinemia: Secondary | ICD-10-CM

## 2024-11-07 DIAGNOSIS — E785 Hyperlipidemia, unspecified: Secondary | ICD-10-CM | POA: Diagnosis not present

## 2024-11-07 DIAGNOSIS — E1169 Type 2 diabetes mellitus with other specified complication: Secondary | ICD-10-CM | POA: Diagnosis not present

## 2024-11-07 DIAGNOSIS — M542 Cervicalgia: Secondary | ICD-10-CM

## 2024-11-07 DIAGNOSIS — Z8739 Personal history of other diseases of the musculoskeletal system and connective tissue: Secondary | ICD-10-CM

## 2024-11-07 MED ORDER — METHYLPREDNISOLONE 4 MG PO TABS: ORAL_TABLET | ORAL | 0 refills | Status: AC

## 2024-11-07 MED ORDER — HYDROCODONE-ACETAMINOPHEN 10-325 MG PO TABS: ORAL_TABLET | ORAL | 0 refills | Status: AC

## 2024-11-07 NOTE — Telephone Encounter (Signed)
 FYI Only or Action Required?: FYI only for provider: appointment scheduled on 11/07/24.  Patient was last seen in primary care on 06/26/2024 by Perri Ronal PARAS, MD.  Called Nurse Triage reporting Pain.  Symptoms began several days ago.  Interventions attempted: OTC medications: Advil , Rest, hydration, or home remedies, and Ice/heat application.  Symptoms are: gradually worsening.  Triage Disposition: See HCP Within 4 Hours (Or PCP Triage)  Patient/caregiver understands and will follow disposition?: Yes    Copied from CRM #[id number]. Topic: Clinical - Red Word Triage >> Nov 07, 2024 10:25 AM Nancy Wagner wrote: Red Word that prompted transfer to Nurse Triage:  muscle and joints pain. started friday and wasnt bad. saturday night gradually got worse and took advil . sunday had alot of pain. yesterday had pain neck and head was excrutiating. today has gotten worse.   can barley walk   muscles pulling in pt head. Reason for Disposition  [1] SEVERE pain (e.g., excruciating, unable to do any normal activities) AND [2] not improved 2 hours after pain medicine  Answer Assessment - Initial Assessment Questions Pt called in stating she has had worsening body aches and muscle pain since Friday, 12/05. Pt states she has taken advil , used heat, applied vics vapor rub and ordered a chinese pain rub offline and nothing has helped her pain. Pt states she has never had pain like she is experiencing that is full body. Pt denies fever or distress; states it is all muscular. Appointment scheduled for evaluation. Patient agrees with plan of care, and will call back if anything changes, or if symptoms worsen.     1. ONSET: When did the muscle aches or body pains start?      Started Friday,12/05 and has gradually worsened   2. LOCATION: What part of your body is hurting? (e.g., entire body, arms, legs)      Started in neck, top of her head and now is body throughout   3. SEVERITY: How bad is the pain?  (Scale 1-10; or mild, moderate, severe)     Severe   4. CAUSE: What do you think is causing the pains?     Unknown   5. FEVER: Do you have a fever? If Yes, ask: What is your temperature, how was it measured, and  when did it start?      None   6. OTHER SYMPTOMS: Do you have any other symptoms? (e.g., chest pain, cold or flu symptoms, rash, weakness, weight loss)     None  Protocols used: Muscle Aches and Body Pain-A-AH

## 2024-11-07 NOTE — Progress Notes (Signed)
 Patient Care Team: Perri Ronal PARAS, MD as PCP - General (Internal Medicine) Elmira Newman PARAS, MD as PCP - Cardiology (Cardiology) [name], MD as Consulting Physician (Ophthalmology)  Visit Date: 11/07/24  Subjective:    Patient ID: Nancy Wagner , Female   DOB: [birth year], 73 y.o.    MRN: [id number]   73 y.o. Female presents today for neck pain. Patient has a past medical history of Hypertension, Hyperlipidemia, Diabetes mellitus type II .  She started to have neck pain on Saturday. She said the pain is mainly in her head and neck but sometimes her body will ache. Denies having any headache, chills, fever, sore throat or vision problems. She also denies doing anything to strain the area like heavy lifting. Sternocleidomastoid muscles were sore to palpation bilaterally.    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   GERD (gastroesophageal reflux disease)    diet control   Gout    12-08-2019  per pt last episode left foot 11/ 2020   Hirsutism    History of obstructive sleep apnea    s/p  UPPP in 1985   Hypertension    followed by pcp  (12-08-2019  per pt had stress test many yrs ago, was told ok)   Lichen simplex chronicus    OA (osteoarthritis)    RA (rheumatoid arthritis) (HCC)    12-08-2019  per pt was dx years ago, treated with high level prednisone  for few weeks and some other treatment with her eyes and has been in remission since   Type 2 diabetes mellitus (HCC)    followed by pcp ---  (12-08-2019 checks cbg's daily in am,  fasting-- 110 to 126)   Uterine fibroid    Wears glasses      Family History  Problem Relation Age of Onset   Heart disease Sister     Social History   Social History Narrative   Right handed    Native of [place], [place]. Attended [employer]. Retired - previously worked as a associate professor at [employer] Married x~50 years- husband is a retired management consultant for [employer]. 2 adult children; 2 granddaughters - 1 granddaughter attending [employer]. Non-smoker or drinker. Frequently travels.      Review of Systems  Constitutional:  Negative for chills and fever.  HENT:  Negative for sore throat.   Eyes: Negative.   Neurological:  Negative for headaches.        Objective:   Vitals: BP 130/80   Pulse 80   Temp 98.8 F (37.1 C)   Ht 5' 3 (1.6 m)   Wt 170 lb (77.1 kg)   SpO2 98%   BMI 30.11 kg/m    Physical Exam Vitals and nursing note reviewed.  Constitutional:      General: She is not in acute distress.    Appearance: Normal appearance. She is not ill-appearing or toxic-appearing.  HENT:     Head: Normocephalic and atraumatic.     Right Ear: Tympanic membrane, ear canal and external ear normal.     Left Ear: Tympanic membrane, ear canal and external ear normal.     Mouth/Throat:     Mouth: Mucous membranes are moist.     Pharynx: Oropharynx is clear. No oropharyngeal exudate or posterior oropharyngeal erythema.  Neck:     Comments: Sternocleidomastoid muscles are sore to palpation bilaterally. Cardiovascular:     Rate and Rhythm: Normal rate and regular rhythm. No extrasystoles are present.    Pulses: Normal pulses.     Heart sounds: Normal heart sounds. No murmur heard.    No friction rub. No gallop.  Pulmonary:     Effort: Pulmonary effort is normal. No respiratory distress.     Breath sounds: Normal breath sounds. No wheezing, rhonchi or rales.  Musculoskeletal:     Cervical back: Muscular tenderness present.  Lymphadenopathy:     Cervical: No cervical adenopathy.  Skin:    General: Skin is warm and dry.  Neurological:     Mental Status: She is alert and oriented to person, place, and time. Mental status is at baseline.     Deep Tendon Reflexes:     Reflex Scores:      Brachioradialis reflexes are 2+ on the  right side and 2+ on the left side. Psychiatric:        Mood and Affect: Mood normal.        Behavior: Behavior normal.        Thought Content: Thought content normal.        Judgment: Judgment normal.       Results:    Labs:       Component Value Date/Time   NA 140 10/11/2024 0935   K 4.1 10/11/2024 0935   CL 102 10/11/2024 0935   CO2 30 10/11/2024 0935   GLUCOSE 149 (H) 10/11/2024 0935   BUN 18 10/11/2024 0935   CREATININE 1.18 (H) 10/11/2024 0935   CREATININE 1.06 (H) 06/20/2024 0956   CALCIUM  10.1 10/11/2024 0935   PROT 8.8 (H) 10/11/2024 0935   ALBUMIN 4.3 10/11/2024 0935   AST 19 10/11/2024 0935   ALT 17 10/11/2024 0935   ALKPHOS 54 10/11/2024 0935   BILITOT 0.3 10/11/2024 0935   GFRNONAA 48 (L) 10/11/2024 0935   GFRNONAA 50 (L) 05/29/2021 0918   GFRAA 58 (L) 05/29/2021 0918     Lab Results  Component Value Date   WBC 6.3 10/11/2024   HGB 11.7 (L) 10/11/2024   HCT 36.2 10/11/2024   MCV 91.2 10/11/2024   PLT 248 10/11/2024    Lab Results  Component Value Date   CHOL 165 06/20/2024   HDL 63 06/20/2024   LDLCALC 78 06/20/2024   TRIG 140 06/20/2024   CHOLHDL 2.6 06/20/2024    Lab Results  Component Value Date   HGBA1C 7.3 (H) 06/20/2024     Lab Results  Component Value Date   TSH 1.21 06/20/2024        Assessment & Plan:   Meds ordered this encounter  Medications   methylPREDNISolone  (MEDROL ) 4 MG tablet    Sig: Take in tapering course as directed 6-5-4-3-2-1    Dispense:  21 tablet    Refill:  0   HYDROcodone -acetaminophen  (NORCO) 10-325 MG tablet    Sig: One half to one tablet by mouth  every 8 hours as needed for neck pain    Dispense:  15 tablet    Refill:  0    Muscle contraction headache: She started to have neck pain on Saturday. She said the pain is mainly in her head and neck but sometimes her body will ache. Denies having any headache, chills, fever, sore throat or vision problems. She also denies doing anything to strain the  area like heavy lifting. Sternocleidomastoid muscles were sore  to palpation bilaterally.    Norco 10-325 mg every 8 hours as needed prescribed.  Medrol  4 mg tapering course 6-5-4-3-2-1 prescribed.  Advised to use heat or ice on the area for 20 minutes 2-3 times a day.      I,Makayla C Reid,acting as a scribe for Ronal JINNY Hailstone, MD.,have documented all relevant documentation on the behalf of Ronal JINNY Hailstone, MD,as directed by  Ronal JINNY Hailstone, MD while in the presence of Ronal JINNY Hailstone, MD.

## 2024-11-08 ENCOUNTER — Inpatient Hospital Stay: Admission: RE | Admit: 2024-11-08 | Discharge: 2024-11-08 | Attending: Internal Medicine | Admitting: Internal Medicine

## 2024-11-08 ENCOUNTER — Encounter: Payer: Self-pay | Admitting: Internal Medicine

## 2024-11-08 DIAGNOSIS — Z1231 Encounter for screening mammogram for malignant neoplasm of breast: Secondary | ICD-10-CM

## 2024-11-08 NOTE — Patient Instructions (Signed)
 Patient seen with acute neck pain.  Was given Norco 10/325 number 15 tablets to take every 8 hours as needed for acute pain.  Take Medrol  4 mg tablets and tapering course as directed starting with 6 tablets day 1 and decrease by 1 tablet daily 6-5-4-3-2-1 taper.  Apply heat or ice to the neck area for 20 minutes 2-3 times daily.  Call if not better in 48 hours or sooner if worse.

## 2024-11-20 ENCOUNTER — Encounter

## 2024-11-22 ENCOUNTER — Ambulatory Visit

## 2024-11-22 DIAGNOSIS — R55 Syncope and collapse: Secondary | ICD-10-CM | POA: Diagnosis not present

## 2024-11-23 ENCOUNTER — Encounter

## 2024-11-23 LAB — CUP PACEART REMOTE DEVICE CHECK
Date Time Interrogation Session: 20251223231259
Implantable Pulse Generator Implant Date: 20230919

## 2024-11-24 ENCOUNTER — Ambulatory Visit: Payer: Self-pay | Admitting: Cardiology

## 2024-11-24 NOTE — Progress Notes (Signed)
 Remote Loop Recorder Transmission

## 2024-12-01 ENCOUNTER — Ambulatory Visit (HOSPITAL_COMMUNITY)
Admission: RE | Admit: 2024-12-01 | Discharge: 2024-12-01 | Disposition: A | Discharge status: Home / Self Care | Source: Ambulatory Visit | Attending: Cardiology | Admitting: Cardiology

## 2024-12-01 ENCOUNTER — Ambulatory Visit: Payer: Self-pay | Admitting: Cardiology

## 2024-12-01 DIAGNOSIS — R0989 Other specified symptoms and signs involving the circulatory and respiratory systems: Secondary | ICD-10-CM | POA: Diagnosis present

## 2024-12-01 DIAGNOSIS — R0609 Other forms of dyspnea: Secondary | ICD-10-CM | POA: Diagnosis present

## 2024-12-01 DIAGNOSIS — I350 Nonrheumatic aortic (valve) stenosis: Secondary | ICD-10-CM

## 2024-12-01 LAB — ECHOCARDIOGRAM COMPLETE
AR max vel: 1.46 cm2
AV Area VTI: 1.5 cm2
AV Area mean vel: 1.44 cm2
AV Mean grad: 6 mmHg
AV Peak grad: 10.7 mmHg
Ao pk vel: 1.64 m/s
Area-P 1/2: 2.32 cm2
S' Lateral: 2.05 cm

## 2024-12-21 ENCOUNTER — Encounter

## 2024-12-22 NOTE — Progress Notes (Shared)
 "   Patient Care Team: Baxley, Ronal PARAS, MD as PCP - General (Internal Medicine) Elmira Newman PARAS, MD as PCP - Cardiology (Cardiology) [name], MD as Consulting Physician (Ophthalmology)  Visit Date: 01/05/25  Subjective:    Patient ID: Alain Formica , Female   DOB: 06-22-1951, 74 y.o.    MRN: [id number]   74 y.o. Female presents today for 6 month follow up for Hypertension, Hypertension, Diabetes Mellitus, Type II. Patient has a past medical history of GE Reflux, Gout.  History of Hypertension treated with Amlodipine  10 mg daily, Clonidine  twice 0.1 mg daily, and Olmesartan -HCTZ 40-25 mg daily. Blood Pressure today is normal at 130/70.    History of Hyperlipidemia treated with Rosuvastatin  20 mg daily. 01/02/25 Lipid Panel WNL.    History of Diabetes Mellitus, type II treated with Janumet -XR [phone] mg daily and Glipizide  10 mg daily. 01/02/2025 HgbA1c 6.9% decreased from 7.3% in July. 01/02/2025 Microalbumin, creatinine normal    History of GE Reflux treated with Nexium  40 mg daily.   History of Gout, which was managed with Allopurinol  100 mg daily, however she says that she had not been taking this due to her pharmacy no longer carrying this, and recently ate something that aggravated her gout, which she managed with benadryl and some topical cream.    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   History of Vitamin-D Deficiency treated with Vitamin-D 2000 units daily.      History of Osteoarthritis; Rheumatoid Arthritis treated with Norco 10-325 mg 0.5-1 full tablet every 6 hours sparingly as needed for pain, Medrol  tapering course.   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   GERD (gastroesophageal reflux disease)    diet control   Gout    12-08-2019  per pt last episode left foot 11/ 2020   Hirsutism    History of obstructive sleep apnea    s/p  UPPP in 1985   Hypertension    followed by pcp  (12-08-2019  per pt had stress test many yrs ago, was told ok)   Lichen simplex chronicus    OA (osteoarthritis)    RA (rheumatoid arthritis) (HCC)    12-08-2019  per pt was dx years ago, treated with high level prednisone  for few weeks and some other treatment with her eyes and has been in remission since   Type 2 diabetes mellitus (HCC)    followed by pcp ---  (12-08-2019 checks cbg's daily in am,  fasting-- 110 to 126)   Uterine fibroid    Wears glasses      Family History  Problem Relation Age of Onset   Heart disease Sister     Social History   Social History Narrative   Right handed    Native of [place], [place]. Attended [employer]. Retired - previously worked as  a engineer, production at [employer] Married x~50 years- husband is a retired management consultant for [employer]. 2 adult children; 2 granddaughters - 1 granddaughter attending [employer]. Non-smoker or drinker. Frequently travels.      Review of Systems  All other systems reviewed and are negative.       Objective:   Vitals: BP 130/70   Pulse 78   Ht 5' 3 (1.6 m)   Wt 182 lb (82.6 kg)   SpO2 98%   BMI 32.24 kg/m    Physical Exam Vitals and nursing note reviewed.  Constitutional:      General: She is not in acute distress.    Appearance: Normal appearance. She  is not toxic-appearing.  HENT:     Head: Normocephalic and atraumatic.  Cardiovascular:     Rate and Rhythm: Normal rate and regular rhythm. No extrasystoles are present.    Pulses: Normal pulses.     Heart sounds: Normal heart sounds. No murmur heard.    No friction rub. No gallop.  Pulmonary:     Effort: Pulmonary effort is normal. No respiratory distress.     Breath sounds: Normal breath sounds. No wheezing or rales.  Skin:    General: Skin is warm and dry.  Neurological:     Mental Status: She is alert and oriented to person, place, and time. Mental status is at baseline.  Psychiatric:        Mood and Affect: Mood normal.        Behavior: Behavior normal.        Thought Content: Thought content normal.        Judgment: Judgment normal.       Results:   Studies obtained and personally reviewed by me:  Labs:       Component Value Date/Time   NA 140 10/11/2024 0935   K 4.1 10/11/2024 0935   CL 102 10/11/2024 0935   CO2 30 10/11/2024 0935   GLUCOSE 149 (H) 10/11/2024 0935   BUN 18 10/11/2024 0935   CREATININE 1.18 (H) 10/11/2024 0935   CREATININE 1.06 (H) 06/20/2024 0956   CALCIUM  10.1 10/11/2024 0935   PROT 7.8 01/02/2025 1034   ALBUMIN 4.3 10/11/2024 0935   AST 19 01/02/2025 1034   AST 19 10/11/2024 0935   ALT 17 01/02/2025 1034   ALT 17 10/11/2024 0935   ALKPHOS 54 10/11/2024 0935   BILITOT 0.3 01/02/2025 1034   BILITOT 0.3 10/11/2024 0935   GFRNONAA 48 (L) 10/11/2024 0935   GFRNONAA 50 (L) 05/29/2021 0918   GFRAA 58 (L) 05/29/2021 0918     Lab Results  Component Value Date   WBC 6.3 10/11/2024   HGB 11.7 (L) 10/11/2024   HCT 36.2 10/11/2024   MCV 91.2 10/11/2024   PLT 248 10/11/2024    Lab Results  Component Value Date   CHOL 124 01/02/2025   HDL 64 01/02/2025   LDLCALC 45 01/02/2025   TRIG 74 01/02/2025   CHOLHDL 1.9 01/02/2025    Lab Results  Component Value Date   HGBA1C 6.9 (H) 01/02/2025     Lab Results  Component Value Date    TSH 1.21 06/20/2024        Assessment & Plan:   Hypertension: treated with Amlodipine  10 mg daily, Clonidine  twice 0.1 mg daily, and Olmesartan -HCTZ 40-25 mg daily. Blood Pressure today is normal at 130/70.    Hyperlipidemia: treated with Rosuvastatin  20 mg daily. 01/02/25 Lipid Panel WNL.  Diabetes Mellitus, type II: treated with Janumet -XR [phone] mg daily and Glipizide  10 mg daily. 01/02/2025 HgbA1c 6.9% decreased from 7.3% in July. 01/02/2025 Microalbumin, creatinine normal    GE Reflux: treated with Nexium  40 mg daily.   Gout: which was managed with Allopurinol  100 mg daily, however she says that she had not been taking this due to her pharmacy no longer carrying this, and recently ate something that aggravated her gout, which she managed with benadryl and some topical cream.    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   Vitamin-D Deficiency: treated with Vitamin-D 2000 units daily.     Osteoarthritis; Rheumatoid Arthritis: treated with Norco 10-325 mg 0.5-1 full tablet every 6 hours sparingly as needed for pain, Medrol  tapering course.    I,Makayla C Reid,acting as a scribe for Ronal JINNY Hailstone, MD.,have documented all relevant documentation on the behalf of Ronal JINNY Hailstone, MD,as directed by  Ronal JINNY Hailstone, MD while in the presence of Ronal JINNY Hailstone, MD.      "

## 2024-12-23 ENCOUNTER — Ambulatory Visit: Attending: Cardiology

## 2024-12-23 DIAGNOSIS — I350 Nonrheumatic aortic (valve) stenosis: Secondary | ICD-10-CM

## 2024-12-25 ENCOUNTER — Encounter

## 2024-12-25 LAB — CUP PACEART REMOTE DEVICE CHECK
Date Time Interrogation Session: 20260123231133
Implantable Pulse Generator Implant Date: 20230919

## 2024-12-26 ENCOUNTER — Ambulatory Visit: Payer: Self-pay | Admitting: Cardiology

## 2024-12-28 NOTE — Progress Notes (Signed)
 Remote Loop Recorder Transmission

## 2025-01-01 ENCOUNTER — Other Ambulatory Visit: Payer: Self-pay

## 2025-01-01 DIAGNOSIS — E1169 Type 2 diabetes mellitus with other specified complication: Secondary | ICD-10-CM

## 2025-01-01 DIAGNOSIS — E119 Type 2 diabetes mellitus without complications: Secondary | ICD-10-CM

## 2025-01-01 DIAGNOSIS — I10 Essential (primary) hypertension: Secondary | ICD-10-CM

## 2025-01-02 ENCOUNTER — Other Ambulatory Visit

## 2025-01-02 DIAGNOSIS — E119 Type 2 diabetes mellitus without complications: Secondary | ICD-10-CM

## 2025-01-02 DIAGNOSIS — E1169 Type 2 diabetes mellitus with other specified complication: Secondary | ICD-10-CM

## 2025-01-02 DIAGNOSIS — I10 Essential (primary) hypertension: Secondary | ICD-10-CM

## 2025-01-03 LAB — HEPATIC FUNCTION PANEL
AG Ratio: 1.2 (calc) (ref 1.0–2.5)
ALT: 17 U/L (ref 6–29)
AST: 19 U/L (ref 10–35)
Albumin: 4.3 g/dL (ref 3.6–5.1)
Alkaline phosphatase (APISO): 48 U/L (ref 37–153)
Bilirubin, Direct: 0.1 mg/dL (ref 0.0–0.2)
Globulin: 3.5 g/dL (ref 1.9–3.7)
Indirect Bilirubin: 0.2 mg/dL (ref 0.2–1.2)
Total Bilirubin: 0.3 mg/dL (ref 0.2–1.2)
Total Protein: 7.8 g/dL (ref 6.1–8.1)

## 2025-01-03 LAB — LIPID PANEL
Cholesterol: 124 mg/dL
HDL: 64 mg/dL
LDL Cholesterol (Calc): 45 mg/dL
Non-HDL Cholesterol (Calc): 60 mg/dL
Total CHOL/HDL Ratio: 1.9 (calc)
Triglycerides: 74 mg/dL

## 2025-01-03 LAB — MICROALBUMIN / CREATININE URINE RATIO
Creatinine, Urine: 133 mg/dL (ref 20–275)
Microalb Creat Ratio: 12 mg/g{creat}
Microalb, Ur: 1.6 mg/dL

## 2025-01-03 LAB — HEMOGLOBIN A1C
Hgb A1c MFr Bld: 6.9 % — ABNORMAL HIGH
Mean Plasma Glucose: 151 mg/dL
eAG (mmol/L): 8.4 mmol/L

## 2025-01-05 ENCOUNTER — Encounter: Payer: Self-pay | Admitting: Internal Medicine

## 2025-01-05 ENCOUNTER — Ambulatory Visit: Payer: Self-pay | Admitting: Internal Medicine

## 2025-01-21 ENCOUNTER — Encounter

## 2025-01-23 ENCOUNTER — Ambulatory Visit

## 2025-01-25 ENCOUNTER — Encounter

## 2025-02-05 ENCOUNTER — Ambulatory Visit: Admitting: Cardiology

## 2025-02-21 ENCOUNTER — Encounter

## 2025-02-23 ENCOUNTER — Ambulatory Visit

## 2025-02-26 ENCOUNTER — Encounter

## 2025-03-24 ENCOUNTER — Encounter

## 2025-03-26 ENCOUNTER — Ambulatory Visit

## 2025-03-29 ENCOUNTER — Encounter

## 2025-04-11 ENCOUNTER — Inpatient Hospital Stay

## 2025-04-25 ENCOUNTER — Inpatient Hospital Stay: Admitting: Physician Assistant

## 2025-04-30 ENCOUNTER — Encounter

## 2025-05-31 ENCOUNTER — Encounter

## 2025-07-02 ENCOUNTER — Encounter

## 2025-07-09 ENCOUNTER — Other Ambulatory Visit

## 2025-07-13 ENCOUNTER — Ambulatory Visit: Admitting: Internal Medicine
